# Patient Record
Sex: Female | Born: 1945 | Race: Black or African American | Hispanic: No | Marital: Married | Smoking: Current every day smoker
Health system: Southern US, Community
[De-identification: ages and names within clinical notes are randomized; demographics above are authoritative.]

## PROBLEM LIST (undated history)

## (undated) DIAGNOSIS — F3289 Other specified depressive episodes: Secondary | ICD-10-CM

## (undated) DIAGNOSIS — K635 Polyp of colon: Secondary | ICD-10-CM

## (undated) DIAGNOSIS — E785 Hyperlipidemia, unspecified: Secondary | ICD-10-CM

## (undated) DIAGNOSIS — R0602 Shortness of breath: Secondary | ICD-10-CM

## (undated) DIAGNOSIS — E119 Type 2 diabetes mellitus without complications: Secondary | ICD-10-CM

## (undated) DIAGNOSIS — F419 Anxiety disorder, unspecified: Secondary | ICD-10-CM

## (undated) DIAGNOSIS — R053 Chronic cough: Secondary | ICD-10-CM

## (undated) DIAGNOSIS — M1990 Unspecified osteoarthritis, unspecified site: Secondary | ICD-10-CM

## (undated) DIAGNOSIS — E213 Hyperparathyroidism, unspecified: Secondary | ICD-10-CM

## (undated) DIAGNOSIS — I10 Essential (primary) hypertension: Secondary | ICD-10-CM

## (undated) DIAGNOSIS — K219 Gastro-esophageal reflux disease without esophagitis: Secondary | ICD-10-CM

## (undated) DIAGNOSIS — D649 Anemia, unspecified: Secondary | ICD-10-CM

## (undated) DIAGNOSIS — I509 Heart failure, unspecified: Secondary | ICD-10-CM

## (undated) DIAGNOSIS — J449 Chronic obstructive pulmonary disease, unspecified: Secondary | ICD-10-CM

## (undated) DIAGNOSIS — R05 Cough: Secondary | ICD-10-CM

## (undated) DIAGNOSIS — N289 Disorder of kidney and ureter, unspecified: Secondary | ICD-10-CM

## (undated) DIAGNOSIS — F329 Major depressive disorder, single episode, unspecified: Secondary | ICD-10-CM

## (undated) HISTORY — DX: Essential (primary) hypertension: I10

## (undated) HISTORY — DX: Cough: R05

## (undated) HISTORY — DX: Gastro-esophageal reflux disease without esophagitis: K21.9

## (undated) HISTORY — DX: Disorder of kidney and ureter, unspecified: N28.9

## (undated) HISTORY — DX: Major depressive disorder, single episode, unspecified: F32.9

## (undated) HISTORY — DX: Chronic cough: R05.3

## (undated) HISTORY — DX: Type 2 diabetes mellitus without complications: E11.9

## (undated) HISTORY — DX: Unspecified osteoarthritis, unspecified site: M19.90

## (undated) HISTORY — DX: Hyperlipidemia, unspecified: E78.5

## (undated) HISTORY — DX: Other specified depressive episodes: F32.89

## (undated) HISTORY — DX: Heart failure, unspecified: I50.9

---

## 1998-08-14 ENCOUNTER — Ambulatory Visit (HOSPITAL_COMMUNITY): Admission: RE | Admit: 1998-08-14 | Discharge: 1998-08-14 | Payer: Self-pay | Admitting: Internal Medicine

## 1999-08-12 ENCOUNTER — Other Ambulatory Visit: Admission: RE | Admit: 1999-08-12 | Discharge: 1999-08-12 | Payer: Self-pay | Admitting: Obstetrics and Gynecology

## 1999-08-17 ENCOUNTER — Encounter: Payer: Self-pay | Admitting: Internal Medicine

## 1999-08-17 ENCOUNTER — Ambulatory Visit (HOSPITAL_COMMUNITY): Admission: RE | Admit: 1999-08-17 | Discharge: 1999-08-17 | Payer: Self-pay | Admitting: Internal Medicine

## 1999-11-03 ENCOUNTER — Encounter (INDEPENDENT_AMBULATORY_CARE_PROVIDER_SITE_OTHER): Payer: Self-pay | Admitting: Specialist

## 1999-11-03 ENCOUNTER — Other Ambulatory Visit: Admission: RE | Admit: 1999-11-03 | Discharge: 1999-11-03 | Payer: Self-pay | Admitting: Gastroenterology

## 2000-08-18 ENCOUNTER — Other Ambulatory Visit: Admission: RE | Admit: 2000-08-18 | Discharge: 2000-08-18 | Payer: Self-pay | Admitting: Obstetrics and Gynecology

## 2000-08-22 ENCOUNTER — Ambulatory Visit (HOSPITAL_COMMUNITY): Admission: RE | Admit: 2000-08-22 | Discharge: 2000-08-22 | Payer: Self-pay | Admitting: Internal Medicine

## 2000-08-22 ENCOUNTER — Encounter: Payer: Self-pay | Admitting: Internal Medicine

## 2001-08-28 ENCOUNTER — Other Ambulatory Visit: Admission: RE | Admit: 2001-08-28 | Discharge: 2001-08-28 | Payer: Self-pay | Admitting: Obstetrics and Gynecology

## 2001-09-24 ENCOUNTER — Encounter: Payer: Self-pay | Admitting: Internal Medicine

## 2001-09-24 ENCOUNTER — Ambulatory Visit (HOSPITAL_COMMUNITY): Admission: RE | Admit: 2001-09-24 | Discharge: 2001-09-24 | Payer: Self-pay | Admitting: Internal Medicine

## 2002-05-29 ENCOUNTER — Encounter: Payer: Self-pay | Admitting: Internal Medicine

## 2002-05-29 ENCOUNTER — Ambulatory Visit (HOSPITAL_COMMUNITY): Admission: RE | Admit: 2002-05-29 | Discharge: 2002-05-29 | Payer: Self-pay | Admitting: Internal Medicine

## 2002-07-16 ENCOUNTER — Ambulatory Visit (HOSPITAL_COMMUNITY): Admission: RE | Admit: 2002-07-16 | Discharge: 2002-07-16 | Payer: Self-pay | Admitting: Internal Medicine

## 2002-07-16 ENCOUNTER — Encounter: Payer: Self-pay | Admitting: Internal Medicine

## 2002-09-04 ENCOUNTER — Other Ambulatory Visit: Admission: RE | Admit: 2002-09-04 | Discharge: 2002-09-04 | Payer: Self-pay | Admitting: Obstetrics and Gynecology

## 2003-10-15 ENCOUNTER — Ambulatory Visit (HOSPITAL_COMMUNITY): Admission: RE | Admit: 2003-10-15 | Discharge: 2003-10-15 | Payer: Self-pay | Admitting: Internal Medicine

## 2003-10-30 ENCOUNTER — Other Ambulatory Visit: Admission: RE | Admit: 2003-10-30 | Discharge: 2003-10-30 | Payer: Self-pay | Admitting: Obstetrics and Gynecology

## 2003-12-05 ENCOUNTER — Encounter: Admission: RE | Admit: 2003-12-05 | Discharge: 2003-12-05 | Payer: Self-pay | Admitting: Obstetrics and Gynecology

## 2003-12-18 ENCOUNTER — Encounter: Admission: RE | Admit: 2003-12-18 | Discharge: 2003-12-18 | Payer: Self-pay | Admitting: Nephrology

## 2004-10-20 ENCOUNTER — Ambulatory Visit (HOSPITAL_COMMUNITY): Admission: RE | Admit: 2004-10-20 | Discharge: 2004-10-20 | Payer: Self-pay | Admitting: Internal Medicine

## 2004-11-02 ENCOUNTER — Encounter: Admission: RE | Admit: 2004-11-02 | Discharge: 2004-11-02 | Payer: Self-pay | Admitting: Nephrology

## 2004-12-10 ENCOUNTER — Encounter: Admission: RE | Admit: 2004-12-10 | Discharge: 2004-12-10 | Payer: Self-pay | Admitting: Nephrology

## 2005-11-15 ENCOUNTER — Ambulatory Visit (HOSPITAL_COMMUNITY): Admission: RE | Admit: 2005-11-15 | Discharge: 2005-11-15 | Payer: Self-pay | Admitting: Internal Medicine

## 2006-02-03 ENCOUNTER — Encounter: Admission: RE | Admit: 2006-02-03 | Discharge: 2006-05-04 | Payer: Self-pay | Admitting: Internal Medicine

## 2006-03-22 ENCOUNTER — Encounter: Payer: Self-pay | Admitting: Internal Medicine

## 2006-07-06 ENCOUNTER — Observation Stay (HOSPITAL_COMMUNITY): Admission: EM | Admit: 2006-07-06 | Discharge: 2006-07-07 | Payer: Self-pay | Admitting: Emergency Medicine

## 2006-07-31 ENCOUNTER — Encounter: Admission: RE | Admit: 2006-07-31 | Discharge: 2006-07-31 | Payer: Self-pay | Admitting: Rheumatology

## 2006-11-16 ENCOUNTER — Ambulatory Visit (HOSPITAL_COMMUNITY): Admission: RE | Admit: 2006-11-16 | Discharge: 2006-11-16 | Payer: Self-pay | Admitting: Internal Medicine

## 2006-11-28 ENCOUNTER — Encounter: Admission: RE | Admit: 2006-11-28 | Discharge: 2006-11-28 | Payer: Self-pay | Admitting: Internal Medicine

## 2006-12-13 ENCOUNTER — Ambulatory Visit: Payer: Self-pay | Admitting: Gastroenterology

## 2007-01-02 ENCOUNTER — Encounter (INDEPENDENT_AMBULATORY_CARE_PROVIDER_SITE_OTHER): Payer: Self-pay | Admitting: Specialist

## 2007-01-02 ENCOUNTER — Ambulatory Visit: Payer: Self-pay | Admitting: Gastroenterology

## 2007-01-30 ENCOUNTER — Ambulatory Visit: Payer: Self-pay | Admitting: Vascular Surgery

## 2008-01-15 ENCOUNTER — Encounter: Admission: RE | Admit: 2008-01-15 | Discharge: 2008-01-15 | Payer: Self-pay | Admitting: Internal Medicine

## 2008-01-25 ENCOUNTER — Ambulatory Visit (HOSPITAL_COMMUNITY): Admission: RE | Admit: 2008-01-25 | Discharge: 2008-01-25 | Payer: Self-pay | Admitting: Internal Medicine

## 2008-09-21 ENCOUNTER — Inpatient Hospital Stay (HOSPITAL_COMMUNITY): Admission: EM | Admit: 2008-09-21 | Discharge: 2008-09-24 | Payer: Self-pay | Admitting: Emergency Medicine

## 2008-09-22 ENCOUNTER — Encounter (INDEPENDENT_AMBULATORY_CARE_PROVIDER_SITE_OTHER): Payer: Self-pay | Admitting: *Deleted

## 2008-09-23 ENCOUNTER — Encounter (INDEPENDENT_AMBULATORY_CARE_PROVIDER_SITE_OTHER): Payer: Self-pay | Admitting: Internal Medicine

## 2008-10-10 ENCOUNTER — Ambulatory Visit: Payer: Self-pay | Admitting: Cardiology

## 2008-10-22 ENCOUNTER — Ambulatory Visit: Payer: Self-pay

## 2009-01-16 ENCOUNTER — Ambulatory Visit (HOSPITAL_COMMUNITY): Admission: RE | Admit: 2009-01-16 | Discharge: 2009-01-16 | Payer: Self-pay | Admitting: Internal Medicine

## 2009-01-16 ENCOUNTER — Encounter: Admission: RE | Admit: 2009-01-16 | Discharge: 2009-01-16 | Payer: Self-pay | Admitting: Internal Medicine

## 2009-02-03 DIAGNOSIS — F325 Major depressive disorder, single episode, in full remission: Secondary | ICD-10-CM | POA: Insufficient documentation

## 2009-02-03 DIAGNOSIS — I10 Essential (primary) hypertension: Secondary | ICD-10-CM

## 2009-02-04 ENCOUNTER — Encounter: Payer: Self-pay | Admitting: Cardiology

## 2009-02-04 ENCOUNTER — Ambulatory Visit: Payer: Self-pay | Admitting: Cardiology

## 2009-02-04 DIAGNOSIS — E663 Overweight: Secondary | ICD-10-CM | POA: Insufficient documentation

## 2009-02-11 ENCOUNTER — Encounter: Payer: Self-pay | Admitting: Emergency Medicine

## 2009-04-09 ENCOUNTER — Ambulatory Visit: Payer: Self-pay | Admitting: Cardiology

## 2009-04-09 DIAGNOSIS — I5032 Chronic diastolic (congestive) heart failure: Secondary | ICD-10-CM | POA: Insufficient documentation

## 2009-04-24 ENCOUNTER — Ambulatory Visit: Payer: Self-pay | Admitting: Emergency Medicine

## 2009-05-26 ENCOUNTER — Ambulatory Visit: Payer: Self-pay | Admitting: Emergency Medicine

## 2009-07-09 ENCOUNTER — Ambulatory Visit: Payer: Self-pay | Admitting: Cardiology

## 2009-07-17 ENCOUNTER — Ambulatory Visit: Payer: Self-pay | Admitting: Emergency Medicine

## 2009-07-17 DIAGNOSIS — J309 Allergic rhinitis, unspecified: Secondary | ICD-10-CM | POA: Insufficient documentation

## 2009-07-28 ENCOUNTER — Encounter: Payer: Self-pay | Admitting: Emergency Medicine

## 2009-09-17 ENCOUNTER — Encounter: Payer: Self-pay | Admitting: Cardiology

## 2009-09-23 ENCOUNTER — Encounter: Payer: Self-pay | Admitting: Cardiology

## 2009-10-12 ENCOUNTER — Encounter (INDEPENDENT_AMBULATORY_CARE_PROVIDER_SITE_OTHER): Payer: Self-pay | Admitting: *Deleted

## 2009-11-11 ENCOUNTER — Ambulatory Visit: Payer: Self-pay | Admitting: Internal Medicine

## 2009-11-11 ENCOUNTER — Ambulatory Visit: Payer: Self-pay | Admitting: Emergency Medicine

## 2009-11-16 ENCOUNTER — Telehealth: Payer: Self-pay | Admitting: Cardiology

## 2009-11-19 ENCOUNTER — Ambulatory Visit: Payer: Self-pay | Admitting: Internal Medicine

## 2009-11-19 ENCOUNTER — Telehealth: Payer: Self-pay | Admitting: Cardiology

## 2009-11-19 LAB — CONVERTED CEMR LAB
Chloride: 104 meq/L (ref 96–112)
GFR calc non Af Amer: 21.91 mL/min (ref >60)
Glucose, Bld: 152 mg/dL — ABNORMAL HIGH (ref 70–99)
Potassium: 3.8 meq/L (ref 3.5–5.1)
Sodium: 142 meq/L (ref 135–145)

## 2009-11-21 HISTORY — PX: VASCULAR SURGERY: SHX849

## 2009-11-24 ENCOUNTER — Ambulatory Visit: Payer: Self-pay | Admitting: Cardiology

## 2009-11-30 ENCOUNTER — Ambulatory Visit: Payer: Self-pay | Admitting: Emergency Medicine

## 2009-11-30 DIAGNOSIS — K219 Gastro-esophageal reflux disease without esophagitis: Secondary | ICD-10-CM

## 2009-12-29 ENCOUNTER — Ambulatory Visit: Payer: Self-pay

## 2009-12-29 ENCOUNTER — Ambulatory Visit: Payer: Self-pay | Admitting: Cardiology

## 2009-12-29 ENCOUNTER — Ambulatory Visit (HOSPITAL_COMMUNITY): Admission: RE | Admit: 2009-12-29 | Discharge: 2009-12-29 | Payer: Self-pay | Admitting: Cardiology

## 2009-12-29 ENCOUNTER — Encounter: Payer: Self-pay | Admitting: Cardiology

## 2010-01-08 ENCOUNTER — Ambulatory Visit: Payer: Self-pay | Admitting: Emergency Medicine

## 2010-01-12 ENCOUNTER — Encounter: Payer: Self-pay | Admitting: Cardiology

## 2010-01-13 ENCOUNTER — Encounter: Payer: Self-pay | Admitting: Cardiology

## 2010-01-14 ENCOUNTER — Ambulatory Visit: Payer: Self-pay | Admitting: Cardiology

## 2010-01-28 ENCOUNTER — Ambulatory Visit (HOSPITAL_COMMUNITY): Admission: RE | Admit: 2010-01-28 | Discharge: 2010-01-28 | Payer: Self-pay | Admitting: Internal Medicine

## 2010-01-28 ENCOUNTER — Encounter: Admission: RE | Admit: 2010-01-28 | Discharge: 2010-01-28 | Payer: Self-pay | Admitting: Family Medicine

## 2010-02-02 ENCOUNTER — Telehealth: Payer: Self-pay | Admitting: Emergency Medicine

## 2010-02-05 ENCOUNTER — Encounter: Admission: RE | Admit: 2010-02-05 | Discharge: 2010-02-05 | Payer: Self-pay | Admitting: Nephrology

## 2010-02-24 ENCOUNTER — Ambulatory Visit: Payer: Self-pay | Admitting: Emergency Medicine

## 2010-02-24 DIAGNOSIS — J449 Chronic obstructive pulmonary disease, unspecified: Secondary | ICD-10-CM

## 2010-06-08 ENCOUNTER — Telehealth: Payer: Self-pay | Admitting: Emergency Medicine

## 2010-06-09 ENCOUNTER — Ambulatory Visit: Payer: Self-pay | Admitting: Internal Medicine

## 2010-06-21 ENCOUNTER — Telehealth (INDEPENDENT_AMBULATORY_CARE_PROVIDER_SITE_OTHER): Payer: Self-pay | Admitting: *Deleted

## 2010-06-23 ENCOUNTER — Ambulatory Visit: Payer: Self-pay | Admitting: Cardiology

## 2010-06-24 ENCOUNTER — Ambulatory Visit: Payer: Self-pay | Admitting: Vascular Surgery

## 2010-07-06 ENCOUNTER — Ambulatory Visit: Payer: Self-pay | Admitting: Vascular Surgery

## 2010-07-06 ENCOUNTER — Ambulatory Visit (HOSPITAL_COMMUNITY)
Admission: RE | Admit: 2010-07-06 | Discharge: 2010-07-06 | Payer: Self-pay | Source: Home / Self Care | Admitting: Vascular Surgery

## 2010-07-13 ENCOUNTER — Ambulatory Visit: Payer: Self-pay | Admitting: Cardiology

## 2010-07-13 ENCOUNTER — Encounter: Payer: Self-pay | Admitting: Cardiology

## 2010-07-13 ENCOUNTER — Inpatient Hospital Stay (HOSPITAL_COMMUNITY): Admission: EM | Admit: 2010-07-13 | Discharge: 2010-07-16 | Payer: Self-pay | Admitting: Emergency Medicine

## 2010-07-16 ENCOUNTER — Encounter: Payer: Self-pay | Admitting: Cardiology

## 2010-07-22 ENCOUNTER — Ambulatory Visit: Payer: Self-pay | Admitting: Emergency Medicine

## 2010-07-23 ENCOUNTER — Inpatient Hospital Stay (HOSPITAL_COMMUNITY): Admission: EM | Admit: 2010-07-23 | Discharge: 2010-07-25 | Payer: Self-pay | Admitting: Emergency Medicine

## 2010-07-23 ENCOUNTER — Ambulatory Visit: Payer: Self-pay | Admitting: Cardiology

## 2010-07-30 ENCOUNTER — Ambulatory Visit: Payer: Self-pay | Admitting: Internal Medicine

## 2010-07-30 ENCOUNTER — Encounter: Payer: Self-pay | Admitting: Physician Assistant

## 2010-07-30 DIAGNOSIS — I498 Other specified cardiac arrhythmias: Secondary | ICD-10-CM

## 2010-08-02 ENCOUNTER — Ambulatory Visit: Payer: Self-pay | Admitting: Internal Medicine

## 2010-08-03 ENCOUNTER — Encounter (HOSPITAL_COMMUNITY)
Admission: RE | Admit: 2010-08-03 | Discharge: 2010-11-01 | Payer: Self-pay | Source: Home / Self Care | Attending: Nephrology | Admitting: Nephrology

## 2010-08-06 ENCOUNTER — Encounter: Payer: Self-pay | Admitting: Cardiology

## 2010-08-11 ENCOUNTER — Ambulatory Visit: Payer: Self-pay | Admitting: Cardiology

## 2010-08-11 DIAGNOSIS — E785 Hyperlipidemia, unspecified: Secondary | ICD-10-CM

## 2010-08-12 ENCOUNTER — Encounter: Payer: Self-pay | Admitting: Cardiology

## 2010-08-12 LAB — CONVERTED CEMR LAB
CO2: 36 meq/L — ABNORMAL HIGH (ref 19–32)
Calcium: 8.5 mg/dL (ref 8.4–10.5)
Creatinine, Ser: 4 mg/dL — ABNORMAL HIGH (ref 0.4–1.2)
GFR calc non Af Amer: 14.61 mL/min (ref >60)
Glucose, Bld: 61 mg/dL — ABNORMAL LOW (ref 70–99)
Sodium: 144 meq/L (ref 135–145)

## 2010-08-18 ENCOUNTER — Ambulatory Visit: Payer: Self-pay | Admitting: Cardiology

## 2010-08-18 ENCOUNTER — Ambulatory Visit: Payer: Self-pay | Admitting: Vascular Surgery

## 2010-08-25 LAB — CONVERTED CEMR LAB
Calcium: 9.2 mg/dL (ref 8.4–10.5)
Chloride: 107 meq/L (ref 96–112)
Creatinine, Ser: 3.9 mg/dL — ABNORMAL HIGH (ref 0.4–1.2)
Glucose, Bld: 45 mg/dL — CL (ref 70–99)
Potassium: 4.1 meq/L (ref 3.5–5.1)
Sodium: 147 meq/L — ABNORMAL HIGH (ref 135–145)

## 2010-09-01 ENCOUNTER — Ambulatory Visit: Payer: Self-pay | Admitting: Vascular Surgery

## 2010-09-13 ENCOUNTER — Encounter (INDEPENDENT_AMBULATORY_CARE_PROVIDER_SITE_OTHER): Payer: Self-pay | Admitting: *Deleted

## 2010-09-14 ENCOUNTER — Ambulatory Visit: Payer: Self-pay | Admitting: Cardiology

## 2010-09-20 ENCOUNTER — Encounter: Payer: Self-pay | Admitting: Cardiology

## 2010-11-02 ENCOUNTER — Encounter (HOSPITAL_COMMUNITY)
Admission: RE | Admit: 2010-11-02 | Discharge: 2010-12-21 | Payer: Self-pay | Source: Home / Self Care | Attending: Nephrology | Admitting: Nephrology

## 2010-12-06 LAB — RENAL FUNCTION PANEL
Albumin: 3 g/dL — ABNORMAL LOW (ref 3.5–5.2)
BUN: 75 mg/dL — ABNORMAL HIGH (ref 6–23)
CO2: 28 mEq/L (ref 19–32)
Calcium: 9.2 mg/dL (ref 8.4–10.5)
Chloride: 95 mEq/L — ABNORMAL LOW (ref 96–112)
Creatinine, Ser: 3.78 mg/dL — ABNORMAL HIGH (ref 0.4–1.2)
GFR calc Af Amer: 15 mL/min — ABNORMAL LOW (ref >60)
GFR calc non Af Amer: 12 mL/min — ABNORMAL LOW (ref >60)
Glucose, Bld: 195 mg/dL — ABNORMAL HIGH (ref 70–99)
Phosphorus: 5.9 mg/dL — ABNORMAL HIGH (ref 2.3–4.6)
Potassium: 3.5 mEq/L (ref 3.5–5.1)
Sodium: 137 mEq/L (ref 135–145)

## 2010-12-06 LAB — FERRITIN: Ferritin: 68 ng/mL (ref 10–291)

## 2010-12-06 LAB — POCT HEMOGLOBIN-HEMACUE: Hemoglobin: 11 g/dL — ABNORMAL LOW (ref 12.0–15.0)

## 2010-12-06 LAB — IRON AND TIBC
Iron: 58 ug/dL (ref 42–135)
Saturation Ratios: 21 % (ref 20–55)
TIBC: 274 ug/dL (ref 250–470)
UIBC: 216 ug/dL

## 2010-12-12 ENCOUNTER — Encounter: Payer: Self-pay | Admitting: Internal Medicine

## 2010-12-23 NOTE — Progress Notes (Signed)
Summary: rx  Phone Note Call from Patient Call back at Home Phone [phone]   Caller: Patient Call For: Asheley Hellberg Reason for Call: Talk to Nurse Summary of Call: real bad cough, long time.  Off & on for several months.  Doesn't seem to be working - coughing constantly. [facility]. Initial call taken by: Eugene Gavia,  June 08, 2010 3:58 PM  Follow-up for Phone Call        called and spoke with pt and she stated that she has had this cough x 2-3 months---dry cough---sometimes with white/yellow sputum--stated she feels like she has a fever today--has coughed so much her muscles in her chest are sore.  please advise.   ALLERGIES:   CODEINE  Additional Follow-up for Phone Call Additional follow up Details #1::        she will need ov  mucinex dm two times a day as needed cough/congestion  Additional Follow-up by: Rubye Oaks NP,  June 08, 2010 4:12 PM    Additional Follow-up for Phone Call Additional follow up Details #2::    appt made for pt per TP to see MW on 7-20 at 10:45---pt is aware of appt Randell Loop CMA  June 08, 2010 4:19 PM

## 2010-12-23 NOTE — Letter (Signed)
Summary: Dept of Health & Human Services   Dept of Health & Human Services   Imported By: Marylou Mccoy 09/06/2010 14:50:49  _____________________________________________________________________  External Attachment:    Type:   Image     Comment:   External Document

## 2010-12-23 NOTE — Assessment & Plan Note (Signed)
Summary: COPD, allergies   Visit Type:  Follow-up Copy to:  Dr. Hyman Hopes Primary Provider/Referring Provider:  Marlowe Shores, MD  CC:  2 month follow up.  States breathing is doing well overall.  Denies SOB, wheezing and chest tightness.  Pt states she does have a prod cough with light mucus and thick, and cloudy mucus.  Requesting med for this.  .  History of Present Illness: 65 yo former smoker (60 pk-yrs), hx DM, renal insuff, HTN. Referred by Dr Hyman Hopes for evaluation of cough. It began over a year ago, seemed to have started after a resp infection but then persisted in setting GERD and PND. Has also been having SOB with exertion, notices it when she walks the stairs. Slight improvement off Micardis.  November 30, 2009--Returns for 1 month follow up - states cough is resolved, SOB has improved but is still present and still having some increased edema in hands/feet/ankles though pt states that has improved as well. Last visit with flare of cough . Tx w/ aggressive rhinitis and Gerd prevention. CXR showed increased interstitial markings. She was seen by Cards, Hochrein, last week Lasix was increased w/ decreased edema. Weight is down 3 lbs. Denies chest pain, dyspnea, orthopnea, hemoptysis, fever, n/v/d.   ROV 01/08/10 -- returns for f/u. Still with occas cough. Tells me that she continues to have exertional SOB, especially climbing stairs. She feels she has been "filling up with fluid". Dr Antoine Poche has been balancing volume status with renal fxn with lasix dosing. She has gained 3 lbs since last visit. Currently back on her baseline lasix dose 40mg  two times a day. Still with nasal gtt, uses fexofenadine + Nasocort. Doesn't do NSWs anymore. She has tried SABA, believes that it helped.   ROV 02/24/10 -- returns after trial spiriva once daily to treat mild COPD. Tells me that she is having continued PND and cough on fexofenadine + nasacort, only doingt NSW once a week. She tells today that the Spiriva seemed  to help her - she feels that breathing is better, able to exert more, tolerate more exertion.   Current Medications (verified): 1)  Aspirin 81 Mg Tabs (Aspirin) .... One By Mouth Daily 2)  Atenolol 100 Mg Tabs (Atenolol) .... One By Mouth Daily 3)  Clonidine Hcl 0.3 Mg Tabs (Clonidine Hcl) .... Two Times A Day 4)  Furosemide 40 Mg Tabs (Furosemide) .... One By Mouth Two Times A Day 5)  Lantus 100 Unit/ml Soln (Insulin Glargine) .... 50 Units Am and 30 Units At Bedtime 6)  Humalog 100 Unit/ml Soln (Insulin Lispro (Human)) .... Ssi 7)  Lyrica 50 Mg Caps (Pregabalin) .... One By Mouth Two Times A Day 8)  Nexium 40 Mg Pack (Esomeprazole Magnesium) .... One By Mouth Daily 9)  Vytorin 10-80 Mg Tabs (Ezetimibe-Simvastatin) .... One By Mouth Daily 10)  Citalopram Hydrobromide 40 Mg Tabs (Citalopram Hydrobromide) .... One By Mouth Daily 11)  Nasacort Aq 55 Mcg/act Aers (Triamcinolone Acetonide(Nasal)) .... 2 Sprays Each Side Once Daily 12)  Fexofenadine Hcl 180 Mg Tabs (Fexofenadine Hcl) .Marland Kitchen.. 1 By Mouth Once Daily 13)  Vitamin D3 2000 Unit Caps (Cholecalciferol) .... Once Daily 14)  Vitamin B-12 1000 Mcg Tabs (Cyanocobalamin) .Marland Kitchen.. 1 Tab Daily 15)  Tessalon 200 Mg Caps (Benzonatate) .Marland Kitchen.. 1 By Mouth Three Times A Day As Needed Cough 16)  Tramadol Hcl 50 Mg Tabs (Tramadol Hcl) .Marland Kitchen.. 1 By Mouth Every 4 Hr As Needed 17)  Ventolin Hfa 108 (90 Base) Mcg/act Aers (Albuterol Sulfate) .Marland KitchenMarland KitchenMarland Kitchen  As Needed 18)  Zemplar 1 Mcg Caps (Paricalcitol) .... 3 Times Weekly 19)  Tums 500 Mg Chew (Calcium Carbonate Antacid) .... With Meals 20)  Spiriva Handihaler 18 Mcg Caps (Tiotropium Bromide Monohydrate) .... Inhale One Capsule Daily  Allergies (verified): 1)  ! * Codiene  Vital Signs:  Patient profile:   65 year old female Height:      64.5 inches Weight:      218.50 pounds BMI:     37.06 O2 Sat:      98 % on Room air Temp:     98.1 degrees F oral Pulse rate:   47 / minute BP sitting:   154 / 72  (left arm) Cuff  size:   large  Vitals Entered By: Gweneth Dimitri RN (February 24, 2010 8:51 AM)  O2 Flow:  Room air CC: 2 month follow up.  States breathing is doing well overall.  Denies SOB, wheezing and chest tightness.  Pt states she does have a prod cough with light mucus and thick, cloudy mucus.  Requesting med for this.   Comments Medications reviewed with patient Daytime contact number verified with patient. Gweneth Dimitri RN  February 24, 2010 8:51 AM    Physical Exam  General:  normal appearance, healthy appearing, and obese.   Head:  normocephalic and atraumatic Eyes:  conjunctiva and sclera clear Nose:  no deformity, discharge, inflammation, or lesions Mouth:  no deformity or lesions, mild posterior pharyngeal erythema, upper dentures Neck:  no masses, thyromegaly, or abnormal cervical nodes Lungs:  clear bilaterally to auscultation Heart:  regular rate and rhythm, S1, S2 without murmurs, rubs, gallops, or clicks Abdomen:  not examined Msk:  no deformity or scoliosis noted with normal posture Extremities:  1+ edema, support hose on Neurologic:  non-focal Psych:  alert and cooperative; normal mood and affect; normal attention span and concentration   Impression & Recommendations:  Problem # 1:  COUGH (ICD-786.2)  - add NSWs every day - contin fexofenadine and nasacort - add back tessalon pearls  Orders: Est. Patient Level III ([address])  Problem # 2:  COPD, MILD (ICD-496) - continue spiriva - as needed SABA  Medications Added to Medication List This Visit: 1)  Clonidine Hcl 0.3 Mg Tabs (Clonidine hcl) .... Two times a day 2)  Lantus 100 Unit/ml Soln (Insulin glargine) .... 50 units am and 30 units at bedtime 3)  Humalog 100 Unit/ml Soln (Insulin lispro (human)) .... Ssi  Patient Instructions: 1)  Continue Spiriva once daily  2)  Use your Ventolin as needed  3)  Continue your fexofenadine and Nasacort spray 4)  Use tessalon pearls as needed  5)  Follow up with Dr Delton Coombes in 4  months or as needed  Prescriptions: TESSALON 200 MG CAPS (BENZONATATE) 1 by mouth three times a day as needed cough  #30 x 2   Entered and Authorized by:   Leslye Peer MD   Signed by:   Leslye Peer MD on 02/24/2010   Method used:   Electronically to        [id number] (retail)       [address]       [place], [place]  [id number]       Ph: [phone]       Fax: [phone]   RxID:   [id number]

## 2010-12-23 NOTE — Progress Notes (Signed)
Summary: sinus CT, limited > ok to order  Phone Note Call from Patient Call back at Home Phone [phone]   Caller: Patient Call For: byrum Summary of Call: pt saw dr wert 2 wks ago for cough. says MW advised her to call in 2 wks if cough hadn't improved and ask to have a sinus CT scheduled.  Initial call taken by: Tivis Ringer, CNA,  June 21, 2010 9:30 AM  Follow-up for Phone Call        called spoke with patient who states that her cough is only marginally improved since last ov w/ MW and would like to go ahead and have the sinus CT scheduled.  order placed in EMR, pt is aware someone will call her with the appt date and time.    Dr. Sherene Sires, does this CT need to be w/ or w/o contrast? Boone Master CNA/MA  June 21, 2010 10:18 AM  limited ct sinus, no contrast Follow-up by: Nyoka Cowden MD,  June 21, 2010 11:16 AM  Additional Follow-up for Phone Call Additional follow up Details #1::        Order placed in EMR -- pt aware order has been placed and will receive call regarding appt date and time of CT.  Gweneth Dimitri RN  June 21, 2010 11:32 AM

## 2010-12-23 NOTE — Assessment & Plan Note (Signed)
Summary: cough, obesity, CHF, CRI, mild COPD   Visit Type:  Follow-up Copy to:  Dr. Hyman Hopes Primary Provider/Referring Provider:  Marlowe Shores, MD  CC:  Cough follow-up.  The patient says her cough is gone...recently in hospital for CHF...increased sob and abdominal swelling...patient is out of Bystolic samples and will need RX if needing to continue on this medication.Marland Kitchen  History of Present Illness: 65 yobf quit smoking after hospitalization [facility] 2009 never better since then with essentially nl pft's documented 05/26/09.   June 09, 2010 ov  c/o worsening cough over the past few months.  Cough mainly dry- occ produces minimal clear to yellow sputum.  Pt states that she coughs "all day and night"- gets SOB when has coughing spells- esp at night w/in 10 min of lying down but minimal actual sputum production and no premature awakening or am exacerbation.  Pt denies any significant sore throat, dysphagia, itching, sneezing,  nasal congestion or excess secretions,  fever, chills, sweats, unintended wt loss, pleuritic or exertional cp, hempoptysis, change in activity tolerance  orthopnea pnd or leg swelling   ROV 07/22/10 -- 65 yo woman. Hx mild COPD, CHF, chronic cough. Presented last time with refractory cough - Dr Sherene Sires held Spiriva, fexofenadine, atenolol. New b-b;locker per cards is coreg. Drainage much improved with chlorphenerimine. Cough is much improved. She doesn't miss the Spiriva. She was started on supplimental O2 during hosp admission last week.   Current Medications (verified): 1)  Aspirin 81 Mg Tabs (Aspirin) .... One By Mouth Daily 2)  Clonidine Hcl 0.3 Mg Tabs (Clonidine Hcl) .... Two Times A Day 3)  Furosemide 40 Mg Tabs (Furosemide) .... One By Mouth Two Times A Day 4)  Lantus 100 Unit/ml Soln (Insulin Glargine) .... 50 Units Am and 30 Units At Bedtime 5)  Humalog 100 Unit/ml Soln (Insulin Lispro (Human)) .... Ssi 6)  Lyrica 50 Mg Caps (Pregabalin) .... One By Mouth Two Times A Day 7)   Nexium 40 Mg Pack (Esomeprazole Magnesium) .... Take  One 30-60 Min Before First Meal of The Day 8)  Vytorin 10-80 Mg Tabs (Ezetimibe-Simvastatin) .... One By Mouth Daily 9)  Citalopram Hydrobromide 40 Mg Tabs (Citalopram Hydrobromide) .... One By Mouth Daily 10)  Nasacort Aq 55 Mcg/act Aers (Triamcinolone Acetonide(Nasal)) .... 2 Sprays Each Side Once Daily 11)  Fexofenadine Hcl 180 Mg Tabs (Fexofenadine Hcl) .Marland Kitchen.. 1 By Mouth Once Daily 12)  Vitamin D3 2000 Unit Caps (Cholecalciferol) .... Once Daily 13)  Vitamin B-12 1000 Mcg Tabs (Cyanocobalamin) .Marland Kitchen.. 1 Tab Daily 14)  Tessalon 200 Mg Caps (Benzonatate) .Marland Kitchen.. 1 By Mouth Three Times A Day As Needed Cough 15)  Tramadol Hcl 50 Mg Tabs (Tramadol Hcl) .Marland Kitchen.. 1 By Mouth Every 4 Hr As Needed 16)  Ventolin Hfa 108 (90 Base) Mcg/act Aers (Albuterol Sulfate) .... As Needed 17)  Zemplar 1 Mcg Caps (Paricalcitol) .... 3 Times Weekly 18)  Tums 500 Mg Chew (Calcium Carbonate Antacid) .... With Meals 19)  Bystolic 10 Mg  Tabs (Nebivolol Hcl) .... Out of Samples 20)  Pepcid Ac Maximum Strength 20 Mg Tabs (Famotidine) .... One At Bedtime 21)  Cardizem Cd 300 Mg Xr24h-Cap (Diltiazem Hcl Coated Beads) .Marland Kitchen.. 1 By Mouth Daily 22)  Carvedilol 6.25 Mg Tabs (Carvedilol) .... 3 By Mouth Two Times A Day  Allergies (verified): 1)  ! * Codiene  Vital Signs:  Patient profile:   65 year old female Height:      64.5 inches (163.83 cm) Weight:  237 pounds (107.73 kg) BMI:     40.20 O2 Sat:      99 % on 2 L/min Temp:     98.0 degrees F (36.67 degrees C) oral Pulse rate:   49 / minute BP sitting:   100 / 62  (right arm) Cuff size:   large  Vitals Entered By: Michel Bickers CMA (July 22, 2010 4:59 PM)  O2 Sat at Rest %:  99 O2 Flow:  2 L/min CC: Cough follow-up.  The patient says her cough is gone...recently in hospital for CHF...increased sob and abdominal swelling...patient is out of Bystolic samples and will need RX if needing to continue on this  medication. Comments Medications reviewed with the patient. Daytime phone verified. Michel Bickers CMA  July 22, 2010 5:00 PM   Physical Exam  General:  normal appearance, healthy appearing, and obese.   Head:  normocephalic and atraumatic Eyes:  conjunctiva and sclera clear Nose:  no deformity, discharge, inflammation, or lesions Mouth:  no deformity or lesions, mild posterior pharyngeal erythema, upper dentures Neck:  no masses, thyromegaly, or abnormal cervical nodes Lungs:  clear bilaterally to auscultation Heart:  regular rate and rhythm, S1, S2 without murmurs, rubs, gallops, or clicks Abdomen:  not examined Msk:  no deformity or scoliosis noted with normal posture Extremities:  1+ edema, support hose on Neurologic:  non-focal Psych:  alert and cooperative; normal mood and affect; normal attention span and concentration   Medications Added to Medication List This Visit: 1)  Bystolic 10 Mg Tabs (Nebivolol hcl) .... Out of samples 2)  Cardizem Cd 300 Mg Xr24h-cap (Diltiazem hcl coated beads) .Marland Kitchen.. 1 by mouth daily 3)  Carvedilol 6.25 Mg Tabs (Carvedilol) .... 3 by mouth two times a day  Other Orders: Est. Patient Level IV (04540) DME Referral (DME)  Patient Instructions: 1)  Please continue your medications as currently ordered.  2)  We will ask [facility] to get you a more portable O2 system.  3)  Follow up with Dr Delton Coombes in 3 months or as needed

## 2010-12-23 NOTE — Miscellaneous (Signed)
  Clinical Lists Changes  Observations: Added new observation of ECHOINTERP: - Left ventricle: The cavity size was normal. Wall thickness was       increased in a pattern of mild LVH. The estimated ejection       fraction was 60%. Wall motion was normal; there were no regional       wall motion abnormalities.     - Mitral valve: Mild regurgitation.     - Left atrium: The atrium was mildly dilated.     - Right atrium: The atrium was mildly dilated.     - Pulmonary arteries: PA peak pressure: 41mm Hg (S). (12/29/2009 12:42)      Echocardiogram  Procedure date:  12/29/2009  Findings:      - Left ventricle: The cavity size was normal. Wall thickness was       increased in a pattern of mild LVH. The estimated ejection       fraction was 60%. Wall motion was normal; there were no regional       wall motion abnormalities.     - Mitral valve: Mild regurgitation.     - Left atrium: The atrium was mildly dilated.     - Right atrium: The atrium was mildly dilated.     - Pulmonary arteries: PA peak pressure: 41mm Hg (S).

## 2010-12-23 NOTE — Letter (Signed)
Summary: [facility] Statement of Medical Necessity   [facility] Statement of Medical Necessity   Imported By: Roderic Ovens 10/04/2010 11:48:29  _____________________________________________________________________  External Attachment:    Type:   Image     Comment:   External Document

## 2010-12-23 NOTE — Miscellaneous (Signed)
  Clinical Lists Changes  Observations: Added new observation of CXR RESULTS:   Findings: There is cardiomegaly and mild interstitial edema.  No   pleural effusion or pneumothorax.    IMPRESSION:   Cardiomegaly and mild interstitial edema.    Read By:  Charyl Dancer,  M.D. (07/23/2010 9:47) Added new observation of CXR RESULTS:  Findings: The heart is enlarged.  There is increased vascular   congestion, interstitial edema and fissural thickening.  No   significant pleural effusion or confluent airspace opacity is   demonstrated.    IMPRESSION:   Interstitial pulmonary edema and fissural thickening consistent   with mild congestive heart failure.    Read By:  Gerrianne Scale,  M.D. (07/13/2010 9:48)      CXR  Procedure date:  07/23/2010  Findings:        Findings: There is cardiomegaly and mild interstitial edema.  No   pleural effusion or pneumothorax.    IMPRESSION:   Cardiomegaly and mild interstitial edema.    Read By:  Charyl Dancer,  M.D.  CXR  Procedure date:  07/13/2010  Findings:       Findings: The heart is enlarged.  There is increased vascular   congestion, interstitial edema and fissural thickening.  No   significant pleural effusion or confluent airspace opacity is   demonstrated.    IMPRESSION:   Interstitial pulmonary edema and fissural thickening consistent   with mild congestive heart failure.    Read By:  Gerrianne Scale,  M.D.

## 2010-12-23 NOTE — Assessment & Plan Note (Signed)
Summary: per check out/sf      Allergies Added:   Primary Provider:  Marlowe Shores, MD   History of Present Illness: The patient presents for followup.  At the last visit she has some increased lower extremity edema. I instructed her to get some compression stockings which she has done. I treated her with a slightly higher dose of diuretic for a few days. She did have her kidney function checked and her creatinine is slightly higher than previous at 3.04 with a BUN of 37. She is back to her previous dose of Lasix. She has seen Dr. Delton Coombes and was treated with Spiriva for a possible pulmonary component to her dyspnea. She has only been on this for 2 days. She has not noticed a difference yet. Her breathing seems to be at baseline. She is not describing any acute shortness of breath, PND or orthopnea. Her swelling seems to be at baseline. She does weigh herself daily and her weight sometimes fluctuate by 3 pounds but they have not been steadily increasing. She does say she is trying better to avoid salt and limit fluid which she was not doing previously.  Current Medications (verified): 1)  Aspirin 81 Mg Tabs (Aspirin) .... One By Mouth Daily 2)  Atenolol 100 Mg Tabs (Atenolol) .... One By Mouth Daily 3)  Clonidine Hcl 0.2 Mg Tabs (Clonidine Hcl) .Marland Kitchen.. 1 Tab By Mouth Three Times A Day 4)  Furosemide 40 Mg Tabs (Furosemide) .... One By Mouth Two Times A Day 5)  Lantus 100 Unit/ml Soln (Insulin Glargine) .... 45 Units Am and 25 Units At Bedtime 6)  Humalog 100 Unit/ml Soln (Insulin Lispro (Human)) .Marland Kitchen.. 10 Units Once Daily 7)  Lyrica 50 Mg Caps (Pregabalin) .... One By Mouth Two Times A Day 8)  Nexium 40 Mg Pack (Esomeprazole Magnesium) .... One By Mouth Daily 9)  Vytorin 10-80 Mg Tabs (Ezetimibe-Simvastatin) .... One By Mouth Daily 10)  Citalopram Hydrobromide 40 Mg Tabs (Citalopram Hydrobromide) .... One By Mouth Daily 11)  Nasacort Aq 55 Mcg/act Aers (Triamcinolone Acetonide(Nasal)) .... 2 Sprays  Each Side Once Daily 12)  Fexofenadine Hcl 180 Mg Tabs (Fexofenadine Hcl) .Marland Kitchen.. 1 By Mouth Once Daily 13)  Vitamin D3 2000 Unit Caps (Cholecalciferol) .... Once Daily 14)  Vitamin B-12 1000 Mcg Tabs (Cyanocobalamin) .Marland Kitchen.. 1 Tab Daily 15)  Tessalon 200 Mg Caps (Benzonatate) .Marland Kitchen.. 1 By Mouth Three Times A Day As Needed Cough 16)  Tramadol Hcl 50 Mg Tabs (Tramadol Hcl) .Marland Kitchen.. 1 By Mouth Every 4 Hr As Needed 17)  Fexofenadine Hcl 180 Mg Tabs (Fexofenadine Hcl) .Marland Kitchen.. 1 Tab By Mouth Once Daily 18)  Ventolin Hfa 108 (90 Base) Mcg/act Aers (Albuterol Sulfate) .... As Needed 19)  Zemplar 1 Mcg Caps (Paricalcitol) .... 3 Times Weekly 20)  Tums 500 Mg Chew (Calcium Carbonate Antacid) .... With Meals  Allergies (verified): 1)  ! * Codiene  Past History:  Past Medical History: Reviewed history from 02/04/2009 and no changes required.  1. Diabetes mellitus type 2.   2. Gastroesophageal reflux disease.   3. Chronic renal insufficiency.   4. Degenerative joint disease.   5. Depression.   6. Hypertension.   7 CHF with a well preserved EF  Review of Systems       As stated in the HPI and negative for all other systems.   Vital Signs:  Patient profile:   65 year old female Height:      65 inches Weight:  218 pounds BMI:     36.41 Pulse rate:   51 / minute Resp:     16 per minute BP sitting:   104 / 64  (right arm)  Vitals Entered By: Marrion Coy, CNA (January 14, 2010 10:13 AM)  Physical Exam  General:  Well developed, well nourished, in no acute distress. Head:  normocephalic and atraumatic Eyes:  PERRLA/EOM intact; conjunctiva and lids normal. Mouth:  Oral mucosa normal. Neck:  Neck supple, no JVD. No masses, thyromegaly or abnormal cervical nodes. Chest Wall:  no deformities or breast masses noted Heart:  Non-displaced PMI, chest non-tender; regular rate and rhythm, S1, S2 without murmurs, rubs or gallops. Carotid upstroke normal, no bruit. Normal abdominal aortic size, no bruits.  Femorals normal pulses, no bruits. Pedals normal pulses. no varicosities. Abdomen:  Bowel sounds positive; abdomen soft and non-tender without masses, organomegaly, or hernias noted. No hepatosplenomegaly, obese Msk:  Back normal, normal gait. Muscle strength and tone normal. Extremities:  mild bilateral lower extremity edema Neurologic:  Alert and oriented x 3. Skin:  Intact without lesions or rashes. Psych:  Normal affect.   Impression & Recommendations:  Problem # 1:  CHRONIC DIASTOLIC HEART FAILURE (ICD-428.32) Today I think she is relatively euvolemic. Really lifestyle changes are most important as increased diuretics will cause progressive renal insufficiency. I think she is okay at this point and is tolerating the meds as listed. I will make no change to her regimen.  Problem # 2:  HYPERTENSION, UNSPECIFIED (ICD-401.9) Her blood pressure is well controlled on the meds as listed. I'll make no change.  Problem # 3:  RENAL INSUFFICIENCY (ICD-588.9) She has followup with the nephrologists in March.  Patient Instructions: 1)  Your physician recommends that you schedule a follow-up appointment in: Monday at 11 am 2)  Your physician recommends that you return for lab work in: on Monday fir basic metabolic panel 428.00 3)  Your physician has recommended you make the following change in your medication: increase furosemide to 40, 40, 20 and 20.  (4 times a day)for   3 days 4)  You have been diagnosed with Congestive Heart Failure or CHF.  CHF is a condition in which a problem with the structure or function of the heart impairs its ability to supply sufficient blood flow to meet the body's needs.  For further information please visit www.cardiosmart.org for detailed information on CHF. 5)  Your physician recommends that you weigh, daily, at the same time every day, and in the same amount of clothing.  Please record your daily weights on the handout provided and bring it to your next  appointment.  Appended Document: per check out/sf PT INSTRUCTIONS ON THIS PT ARE INCORRECT!!  SHE IS TO FOLLOW UP IN 6 MONTHS WITH DR [facility].  SHE HAS NO MEDICATION CHANGES

## 2010-12-23 NOTE — Assessment & Plan Note (Signed)
Summary: Pulmonary/ ext ov for chronic cough   Copy to:  Dr. Hyman Hopes Primary Provider/Referring Provider:  Marlowe Shores, MD  CC:  Acute visit.  Pt c/o worsening cough over the past few months.  Cough mainly dry- occ produces minimal clear to yellow sputum.  Pt states that she coughs "all day and night"- gets SOB when has coughing spells- esp at night. .  History of Present Illness: 43 yobf quit smoking after hospitalization [facility] 2009 never better since then with essentially nl pft's documented 05/26/09.   June 09, 2010 ov  c/o worsening cough over the past few months.  Cough mainly dry- occ produces minimal clear to yellow sputum.  Pt states that she coughs "all day and night"- gets SOB when has coughing spells- esp at night w/in 10 min of lying down but minimal actual sputum production and no premature awakening or am exacerbation.  Pt denies any significant sore throat, dysphagia, itching, sneezing,  nasal congestion or excess secretions,  fever, chills, sweats, unintended wt loss, pleuritic or exertional cp, hempoptysis, change in activity tolerance  orthopnea pnd or leg swelling   Current Medications (verified): 1)  Aspirin 81 Mg Tabs (Aspirin) .... One By Mouth Daily 2)  Atenolol 100 Mg Tabs (Atenolol) .... One By Mouth Daily 3)  Clonidine Hcl 0.3 Mg Tabs (Clonidine Hcl) .... Two Times A Day 4)  Furosemide 40 Mg Tabs (Furosemide) .... One By Mouth Two Times A Day 5)  Lantus 100 Unit/ml Soln (Insulin Glargine) .... 50 Units Am and 30 Units At Bedtime 6)  Humalog 100 Unit/ml Soln (Insulin Lispro (Human)) .... Ssi 7)  Lyrica 50 Mg Caps (Pregabalin) .... One By Mouth Two Times A Day 8)  Nexium 40 Mg Pack (Esomeprazole Magnesium) .... One By Mouth Daily 9)  Vytorin 10-80 Mg Tabs (Ezetimibe-Simvastatin) .... One By Mouth Daily 10)  Citalopram Hydrobromide 40 Mg Tabs (Citalopram Hydrobromide) .... One By Mouth Daily 11)  Nasacort Aq 55 Mcg/act Aers (Triamcinolone Acetonide(Nasal)) .... 2 Sprays Each  Side Once Daily 12)  Fexofenadine Hcl 180 Mg Tabs (Fexofenadine Hcl) .Marland Kitchen.. 1 By Mouth Once Daily 13)  Vitamin D3 2000 Unit Caps (Cholecalciferol) .... Once Daily 14)  Vitamin B-12 1000 Mcg Tabs (Cyanocobalamin) .Marland Kitchen.. 1 Tab Daily 15)  Tessalon 200 Mg Caps (Benzonatate) .Marland Kitchen.. 1 By Mouth Three Times A Day As Needed Cough 16)  Tramadol Hcl 50 Mg Tabs (Tramadol Hcl) .Marland Kitchen.. 1 By Mouth Every 4 Hr As Needed 17)  Ventolin Hfa 108 (90 Base) Mcg/act Aers (Albuterol Sulfate) .... As Needed 18)  Zemplar 1 Mcg Caps (Paricalcitol) .... 3 Times Weekly 19)  Tums 500 Mg Chew (Calcium Carbonate Antacid) .... With Meals 20)  Spiriva Handihaler 18 Mcg Caps (Tiotropium Bromide Monohydrate) .... Inhale One Capsule Daily  Allergies (verified): 1)  ! * Codiene  Past History:  Past Medical History:  1. Diabetes mellitus type 2.   2. Gastroesophageal reflux disease.   3. Chronic renal insufficiency.   4. Degenerative joint disease.   5. Depression.   6. Hypertension   7 CHF with a well preserved EF 8 Chronic cough     - Try off  Atenolol and spiriva June 09, 2010   Vital Signs:  Patient profile:   65 year old female Weight:      227.25 pounds O2 Sat:      93 % on Room air Temp:     98.5 degrees F oral Pulse rate:   52 / minute BP sitting:  112 / 70  (left arm) Cuff size:   large  Vitals Entered By: Vernie Murders (June 09, 2010 11:07 AM)  O2 Flow:  Room air  Physical Exam  Additional Exam:  wt 218 > 227 in general amb bf with classic voice fatigue and subtle pseudowheeze resolves with purse lip maneuver  HEENT:  [place]/AT, , EACs-clear, TMs-wnl, NOSE-clear, THROAT-clear NECK:  Supple w/ fair ROM; no JVD; normal carotid impulses w/o bruits; no thyromegaly or nodules palpated; no lymphadenopathy. RESP  Clear to P & A; decreased BS in bases  CARD:  RRR, no m/r/g   GI:   Soft & nt; nml bowel sounds; no organomegaly or masses detected. Musco: Warm bil,  no calf tenderness, clubbing, pulses intact, trace  edema -support stocking on.      Impression & Recommendations:  Problem # 1:  COUGH (ICD-786.2)   DDX of  difficult airways managment all start with A and  include Adherence, Ace Inhibitors, Acid Reflux, Active Sinus Disease, Alpha 1 Antitripsin deficiency, Anxiety masquerading as Airways dz,  ABPA,  allergy(esp in young), Aspiration (esp in elderly), Adverse effects of DPI,  Active smokers, plus one B  = Beta blocker use..     Adherence seems ok  ? acid reflux See instructions for specific recommendations   ? adverse effect of DPI  - since has no significant airflow obstruction the previous benefit may have been a placebo effect, try off  ? Beta blocker: on high dose atenolol try empiric Bystolic, the most beta -1  selective Beta blocker available in sample form, with bisoprolol the most selective generic choice  on the market.   Orders: Est. Patient Level IV ([address]) Prescription Created Electronically [phone])  Problem # 2:  ALLERGIC RHINITIS (ICD-477.9)  Her updated medication list for this problem includes:    Nasacort Aq 55 Mcg/act Aers (Triamcinolone acetonide(nasal)) .Marland Kitchen... 2 sprays each side once daily    Fexofenadine Hcl 180 Mg Tabs (Fexofenadine hcl) .Marland Kitchen... 1 by mouth once daily  Try H1 antihistamine to see if PNDS better controlled and very short course of prednisone  Medications Added to Medication List This Visit: 1)  Nexium 40 Mg Pack (Esomeprazole magnesium) .... Take  one 30-60 min before first meal of the day 2)  Bystolic 10 Mg Tabs (Nebivolol hcl) .... One tablet daily in place in atenolol 3)  Pepcid Ac Maximum Strength 20 Mg Tabs (Famotidine) .... One at bedtime 4)  Prednisone 10 Mg Tabs (Prednisone) .... 4 each am x 2days, 2x2days, 1x2days and stop  Patient Instructions: 1)  stop spiriva, atenolol and fexfenidine (allegra) 2)  Prednisone x 6 days 3)  Pepcid 20mg  at bedtime 4)  for nose drainage itching sneezing rec chlortrimeton 4 mg one 6 hours 5)  if not  improved x 2 weeks call [phone] ask for [facility] for sinus ct 6)  otherwise see [place] in one month 7)  GERD (REFLUX)  is a common cause of respiratory symptoms. It commonly presents without heartburn and can be treated with medication, but also with lifestyle changes including avoidance of late meals, excessive alcohol, smoking cessation, and avoid fatty foods, chocolate, peppermint, colas, red wine, and acidic juices such as orange juice. NO MINT OR MENTHOL PRODUCTS SO NO COUGH DROPS  8)  USE SUGARLESS CANDY INSTEAD (jolley ranchers)  9)  NO OIL BASED VITAMINS  Prescriptions: PREDNISONE 10 MG  TABS (PREDNISONE) 4 each am x 2days, 2x2days, 1x2days and stop  #14 x 0   Entered and Authorized  by:   Nyoka Cowden MD   Signed by:   Nyoka Cowden MD on 06/09/2010   Method used:   Electronically to        [facility] [address] (retail)       [address]       [place], [place]  [id number]       Ph: [phone]       Fax: [phone]   RxID:   [id number]

## 2010-12-23 NOTE — Miscellaneous (Signed)
Summary: RX for KCL  Clinical Lists Changes  Medications: Rx of POTASSIUM CHLORIDE CRYS CR 20 MEQ CR-TABS (POTASSIUM CHLORIDE CRYS CR) Take one tablet by mouth daily;  #30 x 11;  Signed;  Entered by: Charolotte Capuchin, RN;  Authorized by: Rollene Rotunda, MD, [facility];  Method used: Electronically to [facility] [address], [place], [place]  [id number], Ph: [phone], Fax: [phone]    Prescriptions: POTASSIUM CHLORIDE CRYS CR 20 MEQ CR-TABS (POTASSIUM CHLORIDE CRYS CR) Take one tablet by mouth daily  #30 x 11   Entered by:   Charolotte Capuchin, RN   Authorized by:   Rollene Rotunda, MD, [facility]   Signed by:   Charolotte Capuchin, RN on 08/12/2010   Method used:   Electronically to        [facility] (retail)       [address]       [place], [place]  [id number]       Ph: [phone]       Fax: [phone]   RxID:   [id number]

## 2010-12-23 NOTE — Assessment & Plan Note (Signed)
Summary: NP follow up - SOB, cough   Copy to:  Dr. Hyman Hopes Primary Provider/Referring Provider:  Marlowe Shores, MD  CC:  1 month follow up - states cough is resolved and SOB has improved but is still present and still having some increased edema in hands/feet/ankles though pt states that has improved as well.[name]  History of Present Illness: 65 yo former smoker (60 pk-yrs), hx DM, renal insuff, HTN. Referred by Dr Hyman Hopes for evaluation of cough. It began over a year ago, seemed to have started after a resp infection but then persisted. She has been having a lot of nasal gtt, clear drainage. Has also been having SOB with exertion, notices it when she walks the stairs. Doesn't hear wheezing. Dr Antoine Poche stopped Micardis in hopes that it would resolve the cough - she is better off of the med (less frequent), but still coughing. She was started on flonase also. Takes nexium, but only as needed.     ROV 05/26/09 -- Returns today for f/u of PFT's. Started fexafenodine + NSW + Nasacort. Still with some exertional SOB, has to stop when climbing stairs, doing housework.   July 17, 2009--Returns for follow up. last visit continued  on rhinitis meds w/ nasacort and allegra. Ventolin added for dyspnea as needed.. Returns today improved.  Cough is some better. No dyspnea since last visit, has not used ventolin . Denies chest pain, dyspnea, orthopnea, hemoptysis, fever, n/v/d, edema, headache.   November 11, 2009--Present for work in visit. Complains of dry cough occ producing white mucus, some increased SOB - states cough has never improved since she began coming to this office.  would like refill on tussionex. She continues to cough throughout day, Waxes and wanes. Cough is wearing her down.   November 30, 2009--Returns for 1 month follow up - states cough is resolved, SOB has improved but is still present and still having some increased edema in hands/feet/ankles though pt states that has improved as well. Last visit  with flare of cough . Tx w/ aggressive rhinitis and Gerd prevention. CXR showed increased interstitial markings. She was seen by Cards, Hochrein, last week Lasix was increased w/ decreased edema. Weight is down 3 lbs. Denies chest pain, dyspnea, orthopnea, hemoptysis, fever, n/v/d.   Medications Prior to Update: 1)  Aspirin 81 Mg Tabs (Aspirin) .... One By Mouth Daily 2)  Atenolol 100 Mg Tabs (Atenolol) .... One By Mouth Daily 3)  Clonidine Hcl 0.2 Mg Tabs (Clonidine Hcl) .[name].. 1 Tab By Mouth Three Times A Day 4)  Furosemide 40 Mg Tabs (Furosemide) .... One By Mouth Two Times A Day 5)  Lantus 100 Unit/ml Soln (Insulin Glargine) .... 45 Units Am and 25 Units At Bedtime 6)  Humalog 100 Unit/ml Soln (Insulin Lispro (Human)) .[name].. 10 Units Once Daily 7)  Lyrica 50 Mg Caps (Pregabalin) .... One By Mouth Two Times A Day 8)  Nexium 40 Mg Pack (Esomeprazole Magnesium) .... One By Mouth Daily 9)  Vytorin 10-80 Mg Tabs (Ezetimibe-Simvastatin) .... One By Mouth Daily 10)  Citalopram Hydrobromide 40 Mg Tabs (Citalopram Hydrobromide) .... One By Mouth Daily 11)  Nasacort Aq 55 Mcg/act Aers (Triamcinolone Acetonide(Nasal)) .... 2 Sprays Each Side Once Daily 12)  Fexofenadine Hcl 180 Mg Tabs (Fexofenadine Hcl) .[name].. 1 By Mouth Once Daily 13)  Vitamin D3 2000 Unit Caps (Cholecalciferol) .... Once Daily 14)  Vitamin B-12 1000 Mcg Tabs (Cyanocobalamin) .[name].. 1 Tab Daily 15)  Tessalon 200 Mg Caps (Benzonatate) .[name].. 1 By Mouth Three  Times A Day As Needed Cough 16)  Tramadol Hcl 50 Mg Tabs (Tramadol Hcl) .[name].. 1 By Mouth Every 4 Hr As Needed 17)  Fexofenadine Hcl 180 Mg Tabs (Fexofenadine Hcl) .[name].. 1 Tab By Mouth Once Daily 18)  Ventolin Hfa 108 (90 Base) Mcg/act Aers (Albuterol Sulfate) .... As Needed 19)  Zemplar 1 Mcg Caps (Paricalcitol) .... 3 Times Weekly 20)  Tums 500 Mg Chew (Calcium Carbonate Antacid) .... With Meals  Current Medications (verified): 1)  Aspirin 81 Mg Tabs (Aspirin) .... One By Mouth Daily 2)   Atenolol 100 Mg Tabs (Atenolol) .... One By Mouth Daily 3)  Clonidine Hcl 0.2 Mg Tabs (Clonidine Hcl) .[name].. 1 Tab By Mouth Three Times A Day 4)  Furosemide 40 Mg Tabs (Furosemide) .... One By Mouth Two Times A Day 5)  Lantus 100 Unit/ml Soln (Insulin Glargine) .... 45 Units Am and 25 Units At Bedtime 6)  Humalog 100 Unit/ml Soln (Insulin Lispro (Human)) .[name].. 10 Units Once Daily 7)  Lyrica 50 Mg Caps (Pregabalin) .... One By Mouth Two Times A Day 8)  Nexium 40 Mg Pack (Esomeprazole Magnesium) .... One By Mouth Daily 9)  Vytorin 10-80 Mg Tabs (Ezetimibe-Simvastatin) .... One By Mouth Daily 10)  Citalopram Hydrobromide 40 Mg Tabs (Citalopram Hydrobromide) .... One By Mouth Daily 11)  Nasacort Aq 55 Mcg/act Aers (Triamcinolone Acetonide(Nasal)) .... 2 Sprays Each Side Once Daily 12)  Fexofenadine Hcl 180 Mg Tabs (Fexofenadine Hcl) .[name].. 1 By Mouth Once Daily 13)  Vitamin D3 2000 Unit Caps (Cholecalciferol) .... Once Daily 14)  Vitamin B-12 1000 Mcg Tabs (Cyanocobalamin) .[name].. 1 Tab Daily 15)  Tessalon 200 Mg Caps (Benzonatate) .[name].. 1 By Mouth Three Times A Day As Needed Cough 16)  Tramadol Hcl 50 Mg Tabs (Tramadol Hcl) .[name].. 1 By Mouth Every 4 Hr As Needed 17)  Fexofenadine Hcl 180 Mg Tabs (Fexofenadine Hcl) .[name].. 1 Tab By Mouth Once Daily 18)  Ventolin Hfa 108 (90 Base) Mcg/act Aers (Albuterol Sulfate) .... As Needed 19)  Zemplar 1 Mcg Caps (Paricalcitol) .... 3 Times Weekly 20)  Tums 500 Mg Chew (Calcium Carbonate Antacid) .... With Meals  Allergies (verified): 1)  ! * Codiene  Past History:  Past Medical History: Last updated: 02/04/2009  1. Diabetes mellitus type 2.   2. Gastroesophageal reflux disease.   3. Chronic renal insufficiency.   4. Degenerative joint disease.   5. Depression.   6. Hypertension.   7 CHF with a well preserved EF  Past Surgical History: Last updated: 02/03/2009 none  Family History: Last updated: 04/24/2009 Positive for diabetes and hypertension.    cancer-sister heart disease-father  Social History: Last updated: 11/11/2009  Patient is a former smoker, smokes one pack a day for 30+   years.  She is a nondrinker.  married and lives with husband 2 children 1 dog retired occupation-nursing asst   Risk Factors: Alcohol Use: 0 (04/24/2009)  Risk Factors: Smoking Status: quit (04/24/2009) Packs/Day: 2.0 (04/24/2009)  Review of Systems      See HPI  Vital Signs:  Patient profile:   65 year old female Height:      65 inches Weight:      218.13 pounds BMI:     36.43 O2 Sat:      95 % on Room air Temp:     97.8 degrees F oral Pulse rate:   48 / minute BP sitting:   128 / 70  (right arm) Cuff size:   large  Vitals Entered  By: Boone Master CNA (November 30, 2009 10:30 AM)  O2 Flow:  Room air CC: 1 month follow up - states cough is resolved, SOB has improved but is still present and still having some increased edema in hands/feet/ankles though pt states that has improved as well. Is Patient Diabetic? Yes Comments Medications reviewed with patient Daytime contact number verified with patient. Boone Master CNA  November 30, 2009 10:34 AM    Physical Exam  Additional Exam:  GEN: A/Ox3; pleasant , NAD HEENT:  [place]/AT, , EACs-clear, TMs-wnl, NOSE-clear, THROAT-clear NECK:  Supple w/ fair ROM; no JVD; normal carotid impulses w/o bruits; no thyromegaly or nodules palpated; no lymphadenopathy. RESP  Clear to P & A; decreased BS in bases  CARD:  RRR, no m/r/g   GI:   Soft & nt; nml bowel sounds; no organomegaly or masses detected. Musco: Warm bil,  no calf tenderness, clubbing, pulses intact, 1+edema -support stocking on.  Neuro: intact w/ no focal deficits.    Impression & Recommendations:  Problem # 1:  COUGH (ICD-786.2)  Suspect is multifactoral in nature.  Improved w/ treatment aimed at Rhinitis and GERD prevention.  Continue on present regimen.  follow up Dr. Delton Coombes in 2 months and as needed   Orders: Est.  Patient Level III ([address])  Problem # 2:  CHRONIC DIASTOLIC HEART FAILURE (ICD-428.32)  cont follow up w/ cards as recommended.   Her updated medication list for this problem includes:    Aspirin 81 Mg Tabs (Aspirin) ..... One by mouth daily    Atenolol 100 Mg Tabs (Atenolol) ..... One by mouth daily    Furosemide 40 Mg Tabs (Furosemide) ..... One by mouth two times a day  Orders: Est. Patient Level III ([address]454)  Problem # 3:  DYSPNEA (ICD-786.05)  Multifactoral w/ known chronic diastolic dysfunction.  Currently stable. will cont to follow.  intermittent vol. overload may contribute to her dyspnea,  rec cont follow up cardiology with managment of her diuresis.   Orders: Est. Patient Level III (09811)  Complete Medication List: 1)  Aspirin 81 Mg Tabs (Aspirin) .... One by mouth daily 2)  Atenolol 100 Mg Tabs (Atenolol) .... One by mouth daily 3)  Clonidine Hcl 0.2 Mg Tabs (Clonidine hcl) .[name].. 1 tab by mouth three times a day 4)  Furosemide 40 Mg Tabs (Furosemide) .... One by mouth two times a day 5)  Lantus 100 Unit/ml Soln (Insulin glargine) .... 45 units am and 25 units at bedtime 6)  Humalog 100 Unit/ml Soln (Insulin lispro (human)) .[name].. 10 units once daily 7)  Lyrica 50 Mg Caps (Pregabalin) .... One by mouth two times a day 8)  Nexium 40 Mg Pack (Esomeprazole magnesium) .... One by mouth daily 9)  Vytorin 10-80 Mg Tabs (Ezetimibe-simvastatin) .... One by mouth daily 10)  Citalopram Hydrobromide 40 Mg Tabs (Citalopram hydrobromide) .... One by mouth daily 11)  Nasacort Aq 55 Mcg/act Aers (Triamcinolone acetonide(nasal)) .... 2 sprays each side once daily 12)  Fexofenadine Hcl 180 Mg Tabs (Fexofenadine hcl) .[name].. 1 by mouth once daily 13)  Vitamin D3 2000 Unit Caps (Cholecalciferol) .... Once daily 14)  Vitamin B-12 1000 Mcg Tabs (Cyanocobalamin) .[name].. 1 tab daily 15)  Tessalon 200 Mg Caps (Benzonatate) .[name].. 1 by mouth three times a day as needed cough 16)  Tramadol Hcl 50 Mg  Tabs (Tramadol hcl) .[name].. 1 by mouth every 4 hr as needed 17)  Fexofenadine Hcl 180 Mg Tabs (Fexofenadine hcl) .[name].. 1 tab by mouth once  daily 18)  Ventolin Hfa 108 (90 Base) Mcg/act Aers (Albuterol sulfate) .... As needed 19)  Zemplar 1 Mcg Caps (Paricalcitol) .... 3 times weekly 20)  Tums 500 Mg Chew (Calcium carbonate antacid) .... With meals  Patient Instructions: 1)  follow up Dr. Delton Coombes in 6-8 weeks 2)  Continue on same meds.  3)  call sooner if needed.  4)  Delsym 2 tsp every 12 hrs as needed for cough.  5)  Tessalon three times a day for cough  6)  Tramadol 50mg  1 every 4 hr as needed if still coughing.  7)  Continue fexofenadine once daily  8)  Continue on Pepcid 20mg  at bedtime  9)  Take  Nexium 40mg  in morning before breakfast.  10)  Use sugarless candy, ice chips, water to avoid throat clearing and cough, NO MINTS 11)  Try not to cough or clear throat.

## 2010-12-23 NOTE — Assessment & Plan Note (Signed)
Summary: f/u nurse visit/sl  Medications Added LIPITOR 40 MG TABS (ATORVASTATIN CALCIUM) Take one tablet by mouth daily.      Allergies Added:   Visit Type:  Follow-up Referring Provider:  Dr. Hyman Hopes Primary Provider:  Marlowe Shores, MD  CC:  Edema.  History of Present Illness: The patient presents for followup of volume overload and bradycardia. She's been seen a couple of times since a recent hospitalization. Meds have been adjusted to treat increased volume. At the last appointment with Dr. Tenny Craw she was given a dose of Zaroxolyn because of volume overload. She has lost 5 pounds since that appointment. She says she's feeling much better. She does have chronic O2 because of chronic lung disease as well as diastolic heart failure. She is not having chest pressure, neck or arm discomfort. She is not describing any PND or orthopnea. She has continued edema but thinks it's improved. She's not noticing any palpitations, presyncope or syncope. She does have a bit of a cough nonproductive. She's had no symptomatic bradycardia arrhythmias.  Current Medications (verified): 1)  Aspirin 81 Mg Tabs (Aspirin) .... One By Mouth Daily 2)  Clonidine Hcl 0.3 Mg Tabs (Clonidine Hcl) .... 1/2 Tab Two Times A Day 3)  Furosemide 40 Mg Tabs (Furosemide) .... Two By Mouth Two Times A Day 4)  Lantus 100 Unit/ml Soln (Insulin Glargine) .... 50 Units Am and 30 Units At Bedtime 5)  Humalog 100 Unit/ml Soln (Insulin Lispro (Human)) .... Ssi 6)  Lyrica 50 Mg Caps (Pregabalin) .... One By Mouth Two Times A Day 7)  Nexium 40 Mg Pack (Esomeprazole Magnesium) .... Take  One 30-60 Min Before First Meal of The Day 8)  Vytorin 10-80 Mg Tabs (Ezetimibe-Simvastatin) .... One By Mouth Daily 9)  Citalopram Hydrobromide 40 Mg Tabs (Citalopram Hydrobromide) .... One By Mouth Daily 10)  Nasacort Aq 55 Mcg/act Aers (Triamcinolone Acetonide(Nasal)) .... 2 Sprays Each Side Once Daily 11)  Vitamin D3 2000 Unit Caps (Cholecalciferol)  .... Once Daily 12)  Vitamin B-12 1000 Mcg Tabs (Cyanocobalamin) .Marland Kitchen.. 1 Tab Daily 13)  Tramadol Hcl 50 Mg Tabs (Tramadol Hcl) .Marland Kitchen.. 1 By Mouth Every 4 Hr As Needed 14)  Ventolin Hfa 108 (90 Base) Mcg/act Aers (Albuterol Sulfate) .... As Needed 15)  Zemplar 1 Mcg Caps (Paricalcitol) .... 3 Times Weekly 16)  Tums 500 Mg Chew (Calcium Carbonate Antacid) .... With Meals 17)  Pepcid Ac Maximum Strength 20 Mg Tabs (Famotidine) .... One At Bedtime 18)  Cardizem Cd 300 Mg Xr24h-Cap (Diltiazem Hcl Coated Beads) .Marland Kitchen.. 1 By Mouth Daily 19)  Carvedilol 6.25 Mg Tabs (Carvedilol) .Marland Kitchen.. 1 Tab By Mouth Two Times A Day 20)  Potassium Chloride Crys Cr 20 Meq Cr-Tabs (Potassium Chloride Crys Cr) .... Take One Tablet By Mouth Daily 21)  Metolazone 5 Mg Tabs (Metolazone) .... Take One Tablet By Mouth Daily If Needed  Allergies (verified): 1)  ! * Codiene  Past History:  Past Medical History:  1. Diabetes mellitus type 2.   2. Gastroesophageal reflux disease.   3. Chronic renal insufficiency.   4. Degenerative joint disease.   5. Depression.   6. Hypertension   7 CHF with a well preserved EF  8 Chronic cough     - Try off  Atenolol and spiriva June 09, 2010   Review of Systems       As stated in the HPI and negative for all other systems.   Vital Signs:  Patient profile:   65 year old female  Height:      64.5 inches Weight:      222 pounds BMI:     37.65 Pulse rate:   60 / minute Resp:     18 per minute BP sitting:   171 / 85  (right arm)  Vitals Entered By: Marrion Coy, CNA (August 11, 2010 2:11 PM)  Physical Exam  General:  Well developed, well nourished, in no acute distress. Head:  normocephalic and atraumatic Eyes:  PERRLA/EOM intact; conjunctiva and lids normal. Neck:  Neck supple, no JVD. No masses, thyromegaly or abnormal cervical nodes. Chest Wall:  no deformities or breast masses noted Lungs:  Decreased breath sounds bilaterally Heart:  , S1 and S2 within normal limits, no  S3, no S4, no clicks, no rubs, no murmurs. Abdomen:  Distended and somewhat tense but no rebound or guarding, no hepatomegaly, no splenomegaly, no midline pulsatile mass Msk:  Back normal, normal gait. Muscle strength and tone normal. Pulses:  2+ upper pulses, diminished dorsalis pedis and posterior rales bilaterally Extremities:  Firm swelling in bilateral lower extremities Neurologic:  Alert and oriented x 3. Skin:  Intact without lesions or rashes. Cervical Nodes:  no significant adenopathy Axillary Nodes:  no significant adenopathy Psych:  Normal affect.   Impression & Recommendations:  Problem # 1:  CHRONIC DIASTOLIC HEART FAILURE (ICD-428.32) Is a difficult balance between her renal function and volume overload. She is comfortable today. Therefore, she'll continue on the meds as listed but I will be checking a basic metabolic profile. Orders: TLB-BMP (Basic Metabolic Panel-BMET) (80048-METABOL)  Problem # 2:  BRADYCARDIA (ICD-427.89) This seems to be resolved though her heart rate is still low. We need to be careful of any increased AV nodal blocking agents.  Problem # 3:  RENAL INSUFFICIENCY (ICD-588.9) As above I will check a basic metabolic profile. I reviewed the labs from her most recent hospitalization.  Problem # 4:  HYPERTENSION, UNSPECIFIED (ICD-401.9) Blood pressure is elevated today but she reports that it is low usually. I have asked her to keep a blood pressure diary before I titrate meds further.  Problem # 5:  DYSLIPIDEMIA (ICD-272.4) She needs to be switched off of the simvastatin according to FDA warnings. I'll put her on Lipitor 40 mg daily.  Patient Instructions: 1)  Stop Vytorin 2)  Start Lipitor 40mg  daily 3)  Labs today 4)  Follow up in 1 month Prescriptions: LIPITOR 40 MG TABS (ATORVASTATIN CALCIUM) Take one tablet by mouth daily.  #30 x 6   Entered by:   Meredith Staggers, RN   Authorized by:   Rollene Rotunda, MD, [facility]   Signed by:   Meredith Staggers, RN  on 08/11/2010   Method used:   Electronically to        [facility] (retail)       [address]       [place], [place]  [id number]       Ph: [phone]       Fax: [phone]   RxID:   [id number]  I have reviewed and approved all prescriptions at the time of this visit. Rollene Rotunda, MD, [facility]  August 11, 2010 3:00 PM

## 2010-12-23 NOTE — Letter (Signed)
Summary: Certificate of Medical Necessity   Certificate of Medical Necessity   Imported By: Roderic Ovens 08/20/2010 16:15:59  _____________________________________________________________________  External Attachment:    Type:   Image     Comment:   External Document

## 2010-12-23 NOTE — Assessment & Plan Note (Signed)
Summary: EPH/JML  Medications Added CLONIDINE HCL 0.3 MG TABS (CLONIDINE HCL) 1/2 tab two times a day CARVEDILOL 6.25 MG TABS (CARVEDILOL) 1 tab by mouth two times a day POTASSIUM CHLORIDE CRYS CR 20 MEQ CR-TABS (POTASSIUM CHLORIDE CRYS CR) Take one tablet by mouth daily METOLAZONE 5 MG TABS (METOLAZONE) Take one tablet by mouth daily if needed      Allergies Added:   Visit Type:  Follow-up Referring Provider:  Dr. Hyman Hopes Primary Provider:  Marlowe Shores, MD   History of Present Illness: This is a 65 year old African American female patient who had 2 recent admissions with acute on chronic diastolic heart failure. She also has chronic renal insufficiency with creatinines between 3.4 and 4.5. Carvedilol was added to her medications.  The patient continues to have a lot of fluid on board. She saw Dr. Hyman Hopes on Monday who added metolazone to be taken if she gained 4 pounds overnight. He also increased her Lasix from 60 mg b.i.d. to 80 mg b.i.d.  The patient continues to be short of breath, that she could hardly walk yesterday because she was so swollen, and feels very weak and sluggish. She denies chest pain, palpitations, dizziness, or presyncope.  Current Medications (verified): 1)  Aspirin 81 Mg Tabs (Aspirin) .... One By Mouth Daily 2)  Clonidine Hcl 0.3 Mg Tabs (Clonidine Hcl) .... Two Times A Day 3)  Furosemide 40 Mg Tabs (Furosemide) .... One By Mouth Two Times A Day 4)  Lantus 100 Unit/ml Soln (Insulin Glargine) .... 50 Units Am and 30 Units At Bedtime 5)  Humalog 100 Unit/ml Soln (Insulin Lispro (Human)) .... Ssi 6)  Lyrica 50 Mg Caps (Pregabalin) .... One By Mouth Two Times A Day 7)  Nexium 40 Mg Pack (Esomeprazole Magnesium) .... Take  One 30-60 Min Before First Meal of The Day 8)  Vytorin 10-80 Mg Tabs (Ezetimibe-Simvastatin) .... One By Mouth Daily 9)  Citalopram Hydrobromide 40 Mg Tabs (Citalopram Hydrobromide) .... One By Mouth Daily 10)  Nasacort Aq 55 Mcg/act Aers  (Triamcinolone Acetonide(Nasal)) .... 2 Sprays Each Side Once Daily 11)  Vitamin D3 2000 Unit Caps (Cholecalciferol) .... Once Daily 12)  Vitamin B-12 1000 Mcg Tabs (Cyanocobalamin) .Marland Kitchen.. 1 Tab Daily 13)  Tessalon 200 Mg Caps (Benzonatate) .Marland Kitchen.. 1 By Mouth Three Times A Day As Needed Cough 14)  Tramadol Hcl 50 Mg Tabs (Tramadol Hcl) .Marland Kitchen.. 1 By Mouth Every 4 Hr As Needed 15)  Ventolin Hfa 108 (90 Base) Mcg/act Aers (Albuterol Sulfate) .... As Needed 16)  Zemplar 1 Mcg Caps (Paricalcitol) .... 3 Times Weekly 17)  Tums 500 Mg Chew (Calcium Carbonate Antacid) .... With Meals 18)  Pepcid Ac Maximum Strength 20 Mg Tabs (Famotidine) .... One At Bedtime 19)  Cardizem Cd 300 Mg Xr24h-Cap (Diltiazem Hcl Coated Beads) .Marland Kitchen.. 1 By Mouth Daily 20)  Carvedilol 6.25 Mg Tabs (Carvedilol) .... 3 By Mouth Two Times A Day 21)  Potassium Chloride Crys Cr 20 Meq Cr-Tabs (Potassium Chloride Crys Cr) .... Take One Tablet By Mouth Daily 22)  Metolazone 5 Mg Tabs (Metolazone) .... Take One Tablet By Mouth Daily If Needed  Allergies (verified): 1)  ! * Codiene  Past History:  Past Medical History: Last updated: 06/09/2010  1. Diabetes mellitus type 2.   2. Gastroesophageal reflux disease.   3. Chronic renal insufficiency.   4. Degenerative joint disease.   5. Depression.   6. Hypertension   7 CHF with a well preserved EF 8 Chronic cough     -  Try off  Atenolol and spiriva June 09, 2010   Review of Systems       see history of present illness  Vital Signs:  Patient profile:   65 year old female Height:      64.5 inches Weight:      240 pounds BMI:     40.71 Pulse rate:   56 / minute BP sitting:   112 / 58  (left arm)  Vitals Entered By: Laurance Flatten CMA (July 30, 2010 11:59 AM)  Physical Exam  General:   Well-nournished, in no acute distress. Neck: No JVD, HJR, Bruit, or thyroid enlargement Lungs: Decreased breath sounds throughout, clear without wheezing, rales, or rhonchi Cardiovascular:  RRR, PMI not displaced, heart sounds normal, no murmurs, gallops, bruit, thrill, or heave. Abdomen: Tight and swollen,BS normal.  without organomegaly, masses, lesions or tenderness. Extremities: the edema worsened her thighs than in her lower legs,without cyanosis, clubbing. Good distal pulses bilateral SKin: Warm, no lesions or rashes  Musculoskeletal: No deformities Neuro: no focal signs    EKG  Procedure date:  07/30/2010  Findings:      junctional escape rhythm at 56 beats per minute  Impression & Recommendations:  Problem # 1:  CHRONIC DIASTOLIC HEART FAILURE (ICD-428.32) Phas significant diastolic heart failure with a large amount of fluid on board. I asked her to go ahead and take her metolazone when she goes home today. The following medications were removed from the medication list:    Bystolic 10 Mg Tabs (Nebivolol hcl) ..... Out of samples Her updated medication list for this problem includes:    Aspirin 81 Mg Tabs (Aspirin) ..... One by mouth daily    Furosemide 40 Mg Tabs (Furosemide) ..... One by mouth two times a day    Cardizem Cd 300 Mg Xr24h-cap (Diltiazem hcl coated beads) .Marland Kitchen... 1 by mouth daily    Carvedilol 6.25 Mg Tabs (Carvedilol) .Marland Kitchen... 1 tab by mouth two times a day    Metolazone 5 Mg Tabs (Metolazone) .Marland Kitchen... Take one tablet by mouth daily if needed  The following medications were removed from the medication list:    Bystolic 10 Mg Tabs (Nebivolol hcl) ..... Out of samples Her updated medication list for this problem includes:    Aspirin 81 Mg Tabs (Aspirin) ..... One by mouth daily    Furosemide 40 Mg Tabs (Furosemide) ..... One by mouth two times a day    Cardizem Cd 300 Mg Xr24h-cap (Diltiazem hcl coated beads) .Marland Kitchen... 1 by mouth daily    Carvedilol 6.25 Mg Tabs (Carvedilol) .Marland KitchenMarland KitchenMarland KitchenMarland Kitchen 3 by mouth two times a day    Metolazone 5 Mg Tabs (Metolazone) .Marland Kitchen... Take one tablet by mouth daily if needed  Problem # 2:  BRADYCARDIA (ICD-427.89) Patient is in a  junctional escape rhythm. I reviewed her EKGs with Dr. Huston Foley. We will hold her carvedilol tonight, decrease her carvedilol to 6.25 mg one tablet b.i.d., and decrease her clonidine to the 0.3 mg one half tablet b.i.d. Will have her come back for an EKG on Monday. I've asked her to go to the emergency room if she has any increase in her dyspnea, dizziness, or presyncope. The following medications were removed from the medication list:    Bystolic 10 Mg Tabs (Nebivolol hcl) ..... Out of samples Her updated medication list for this problem includes:    Aspirin 81 Mg Tabs (Aspirin) ..... One by mouth daily    Cardizem Cd 300 Mg Xr24h-cap (Diltiazem hcl coated beads) .Marland KitchenMarland KitchenMarland KitchenMarland Kitchen  1 by mouth daily    Carvedilol 6.25 Mg Tabs (Carvedilol) .Marland Kitchen... 1 tab by mouth two times a day  Problem # 3:  RENAL INSUFFICIENCY (ICD-588.9) Follow closely with Dr. Hyman Hopes. She will be difficult to manage with her heart failure and renal insufficiency. Orders: EKG w/ Interpretation (93000)  Problem # 4:  HYPERTENSION, UNSPECIFIED (ICD-401.9) blood pressure stable The following medications were removed from the medication list:    Bystolic 10 Mg Tabs (Nebivolol hcl) ..... Out of samples Her updated medication list for this problem includes:    Aspirin 81 Mg Tabs (Aspirin) ..... One by mouth daily    Clonidine Hcl 0.3 Mg Tabs (Clonidine hcl) .Marland Kitchen... 1/2 tab two times a day    Furosemide 40 Mg Tabs (Furosemide) ..... One by mouth two times a day    Cardizem Cd 300 Mg Xr24h-cap (Diltiazem hcl coated beads) .Marland Kitchen... 1 by mouth daily    Carvedilol 6.25 Mg Tabs (Carvedilol) .Marland Kitchen... 1 tab by mouth two times a day    Metolazone 5 Mg Tabs (Metolazone) .Marland Kitchen... Take one tablet by mouth daily if needed  Orders: EKG w/ Interpretation (93000)  Patient Instructions: 1)  Take a Metolazone when you get home today 2)  Hold your evening dose of Carvedilol today and then 3)  Decrease Carvedilol to 6.25mg  two times a day  4)  Decrease Clonidine to  1/2 tab two times a day  5)  If Shortness of Breathe increased or gets worse over the weekend please report to the Emergency Room 6)  Return ton Monday for a nurse visit to have an EKG 7)  Follow up with Dr Antoine Poche in 1 week

## 2010-12-23 NOTE — Letter (Signed)
Summary: Discharge Summary  Discharge Summary   Imported By: Earl Many 10/11/2010 14:41:26  _____________________________________________________________________  External Attachment:    Type:   Image     Comment:   External Document

## 2010-12-23 NOTE — Assessment & Plan Note (Signed)
Summary: rov/increased edema and SOB/debra  Medications Added CLONIDINE HCL 0.2 MG TABS (CLONIDINE HCL) 1 tab by mouth three times a day LANTUS 100 UNIT/ML SOLN (INSULIN GLARGINE) 45 units am and 25 units at bedtime HUMALOG 100 UNIT/ML SOLN (INSULIN LISPRO (HUMAN)) 10 units once daily FEXOFENADINE HCL 180 MG TABS (FEXOFENADINE HCL) 1 tab by mouth once daily VENTOLIN HFA 108 (90 BASE) MCG/ACT AERS (ALBUTEROL SULFATE) as needed ZEMPLAR 1 MCG CAPS (PARICALCITOL) 3 times weekly TUMS 500 MG CHEW (CALCIUM CARBONATE ANTACID) with meals        Visit Type:  Follow-up Referring Provider:  Dr. Hyman Hopes Primary Provider:  Marlowe Shores, MD  CC:  Edema.  History of Present Illness: The patient returns for followup of her heart failure with preserved ejection fraction. Since I last saw her she finally had improvement of her cough after treatment with benzonatate. However, she's had weight gain of about 10 pounds over short period of time and increased lower extremity edema. She was treated after calling this clinic with a few days of doubling her Lasix. Unfortunately her creatinine went from 2.2-2.8. She is again taking 40 mg b.i.d. of Lasix. Her weights have been slowly creeping up. She continues to have some mild shortness of brescribing he has had no palpitations, presyncope or syncope. She has had no chest pain, fevers or chills.  Current Medications (verified): 1)  Aspirin 81 Mg Tabs (Aspirin) .... One By Mouth Daily 2)  Atenolol 100 Mg Tabs (Atenolol) .... One By Mouth Daily 3)  Clonidine Hcl 0.2 Mg Tabs (Clonidine Hcl) .Marland Kitchen.. 1 Tab By Mouth Three Times A Day 4)  Furosemide 40 Mg Tabs (Furosemide) .... One By Mouth Two Times A Day 5)  Lantus 100 Unit/ml Soln (Insulin Glargine) .... 45 Units Am and 25 Units At Bedtime 6)  Humalog 100 Unit/ml Soln (Insulin Lispro (Human)) .Marland Kitchen.. 10 Units Once Daily 7)  Lyrica 50 Mg Caps (Pregabalin) .... One By Mouth Two Times A Day 8)  Nexium 40 Mg Pack (Esomeprazole  Magnesium) .... One By Mouth Daily 9)  Vytorin 10-80 Mg Tabs (Ezetimibe-Simvastatin) .... One By Mouth Daily 10)  Citalopram Hydrobromide 40 Mg Tabs (Citalopram Hydrobromide) .... One By Mouth Daily 11)  Nasacort Aq 55 Mcg/act Aers (Triamcinolone Acetonide(Nasal)) .... 2 Sprays Each Side Once Daily 12)  Fexofenadine Hcl 180 Mg Tabs (Fexofenadine Hcl) .Marland Kitchen.. 1 By Mouth Once Daily 13)  Vitamin D3 2000 Unit Caps (Cholecalciferol) .... Once Daily 14)  Vitamin B-12 1000 Mcg Tabs (Cyanocobalamin) .Marland Kitchen.. 1 Tab Daily 15)  Tessalon 200 Mg Caps (Benzonatate) .Marland Kitchen.. 1 By Mouth Three Times A Day As Needed Cough 16)  Tramadol Hcl 50 Mg Tabs (Tramadol Hcl) .Marland Kitchen.. 1 By Mouth Every 4 Hr As Needed 17)  Fexofenadine Hcl 180 Mg Tabs (Fexofenadine Hcl) .Marland Kitchen.. 1 Tab By Mouth Once Daily 18)  Ventolin Hfa 108 (90 Base) Mcg/act Aers (Albuterol Sulfate) .... As Needed 19)  Zemplar 1 Mcg Caps (Paricalcitol) .... 3 Times Weekly 20)  Tums 500 Mg Chew (Calcium Carbonate Antacid) .... With Meals  Past History:  Past Medical History: Reviewed history from 02/04/2009 and no changes required.  1. Diabetes mellitus type 2.   2. Gastroesophageal reflux disease.   3. Chronic renal insufficiency.   4. Degenerative joint disease.   5. Depression.   6. Hypertension.   7 CHF with a well preserved EF  Review of Systems       As stated in the HPI and negative for all other systems.  Vital Signs:  Patient profile:   65 year old female Height:      65 inches Weight:      221 pounds BMI:     36.91 Pulse rate:   53 / minute Resp:     12 per minute BP sitting:   146 / 80  (left arm)  Vitals Entered By: Kem Parkinson (November 24, 2009 2:55 PM)  Physical Exam  General:  Well developed, well nourished, in no acute distress. Head:  normocephalic and atraumatic Eyes:  PERRLA/EOM intact; conjunctiva and lids normal. Mouth:  Teeth, gums and palate normal. Oral mucosa normal. Neck:  Neck supple, no JVD. No masses, thyromegaly  or abnormal cervical nodes. Chest Wall:  no deformities or breast masses noted Lungs:  Clear bilaterally to auscultation and percussion. Abdomen:  Bowel sounds positive; abdomen soft and non-tender without masses, organomegaly, or hernias noted. No hepatosplenomegaly,  Msk:  Back normal, normal gait. Muscle strength and tone normal. Extremities:  significant bilateral edema to above the knees Neurologic:  Alert and oriented x 3. Skin:  Intact without lesions or rashes. Psych:  Normal affect.   Detailed Cardiovascular Exam  Neck    Carotids: Carotids full and equal bilaterally without bruits.      Neck Veins: Normal, no JVD.    Heart    Inspection: no deformities or lifts noted.      Palpation: normal PMI with no thrills palpable.      Auscultation: regular rate and rhythm, S1, S2 without murmurs, rubs, gallops, or clicks.    Vascular    Abdominal Aorta: no palpable masses, pulsations, or audible bruits.      Femoral Pulses: normal femoral pulses bilaterally.      Pedal Pulses: normal pedal pulses bilaterally.      Radial Pulses: normal radial pulses bilaterally.      Peripheral Circulation: no clubbing, cyanosis, or edema noted with normal capillary refill.     EKG  Procedure date:  11/24/2009  Findings:      sinus rhythm, rate 53, axis within normal limits, intervals within normal limits, no acute ST-T wave changes  Impression & Recommendations:  Problem # 1:  CHRONIC DIASTOLIC HEART FAILURE (ICD-428.32) Unfortunately she continues to have increasing edema complicated by renal insufficiency. I had a long discussion with the patient and her daughter. I still think she eats salt and I told her she needs a.c. rose salt diet (2 g) and a 1500 cc fluid restriction. I told her she absolutely needs to wear compression stockings. For 2 days she'll take Lasix 80 mg b.i.d. She'll go back to 40 b.i.d. after that. She will see her primary care doctor in mid January and we'll get another  basic metabolic profile. She will call us if her weights continue to increase.  Orders: EKG w/ Interpretation (93000)  Problem # 2:  RENAL INSUFFICIENCY (ICD-588.9) As A broad we will watch this very closely.  Problem # 3:  HYPERTENSION, UNSPECIFIED (ICD-401.9) Her blood pressure is slightly elevated today. It has been controlled at previous visits. I will titrate meds as indicated based on future readings. Orders: EKG w/ Interpretation (93000)  Patient Instructions: 1)  Your physician recommends that you schedule a follow-up appointment in: 6 weeks with Dr Antoine Poche 2)  Your physician recommends that you continue on your current medications as directed. Please refer to the Current Medication list given to you today.

## 2010-12-23 NOTE — Assessment & Plan Note (Signed)
Summary: 1 month rov.sl      Allergies Added:   Visit Type:  Follow-up Referring Provider:  Dr. Hyman Hopes Primary Provider:  Marlowe Shores, MD  CC:  Diastolic HF.  History of Present Illness: The patient presents for followup of diastolic heart failure. Since I last saw her she has been followed closely by Dr. Hyman Hopes.  She has stable renal insufficiency. He had been managing her diuretics. Though she has continued lower extremity edema and dyspnea with exertion she's not had any acute exacerbations.  She denies any new PND or orthopnea. She has had no new palpitations, presyncope or syncope. She denies any chest pressure, neck or arm discomfort.  Current Medications (verified): 1)  Aspirin 81 Mg Tabs (Aspirin) .... One By Mouth Daily 2)  Clonidine Hcl 0.3 Mg Tabs (Clonidine Hcl) .... 1/2 Tab Two Times A Day 3)  Furosemide 40 Mg Tabs (Furosemide) .... Two By Mouth Two Times A Day 4)  Lantus 100 Unit/ml Soln (Insulin Glargine) .... 50 Units Am and 30 Units At Bedtime 5)  Humalog 100 Unit/ml Soln (Insulin Lispro (Human)) .... Ssi 6)  Lyrica 50 Mg Caps (Pregabalin) .... One By Mouth Two Times A Day 7)  Nexium 40 Mg Pack (Esomeprazole Magnesium) .... Take  One 30-60 Min Before First Meal of The Day 8)  Lipitor 40 Mg Tabs (Atorvastatin Calcium) .... Take One Tablet By Mouth Daily. 9)  Citalopram Hydrobromide 40 Mg Tabs (Citalopram Hydrobromide) .... One By Mouth Daily 10)  Nasacort Aq 55 Mcg/act Aers (Triamcinolone Acetonide(Nasal)) .... 2 Sprays Each Side Once Daily 11)  Vitamin D3 2000 Unit Caps (Cholecalciferol) .... Once Daily 12)  Vitamin B-12 1000 Mcg Tabs (Cyanocobalamin) .Marland Kitchen.. 1 Tab Daily 13)  Tramadol Hcl 50 Mg Tabs (Tramadol Hcl) .Marland Kitchen.. 1 By Mouth Every 4 Hr As Needed 14)  Ventolin Hfa 108 (90 Base) Mcg/act Aers (Albuterol Sulfate) .... As Needed 15)  Zemplar 1 Mcg Caps (Paricalcitol) .... 3 Times Weekly 16)  Tums 500 Mg Chew (Calcium Carbonate Antacid) .... With Meals 17)  Pepcid Ac Maximum  Strength 20 Mg Tabs (Famotidine) .... One At Bedtime 18)  Cardizem Cd 300 Mg Xr24h-Cap (Diltiazem Hcl Coated Beads) .Marland Kitchen.. 1 By Mouth Daily 19)  Carvedilol 6.25 Mg Tabs (Carvedilol) .Marland Kitchen.. 1 Tab By Mouth Two Times A Day 20)  Potassium Chloride Crys Cr 20 Meq Cr-Tabs (Potassium Chloride Crys Cr) .... Take One Tablet By Mouth Daily 21)  Metolazone 5 Mg Tabs (Metolazone) .... Take One Tablet By Mouth Daily If Needed  Allergies (verified): 1)  ! * Codiene  Past History:  Past Medical History: Reviewed history from 08/11/2010 and no changes required.  1. Diabetes mellitus type 2.   2. Gastroesophageal reflux disease.   3. Chronic renal insufficiency.   4. Degenerative joint disease.   5. Depression.   6. Hypertension   7 CHF with a well preserved EF  8 Chronic cough     - Try off  Atenolol and spiriva June 09, 2010   Review of Systems       As stated in the HPI and negative for all other systems.   Vital Signs:  Patient profile:   65 year old female Height:      64.5 inches Weight:      226 pounds BMI:     38.33 Pulse rate:   59 / minute Resp:     16 per minute BP sitting:   128 / 68  (right arm)  Vitals Entered By: Neysa Bonito  Cristela Felt, CNA (September 14, 2010 9:02 AM)  Physical Exam  General:  Well developed, well nourished, in no acute distress. Head:  normocephalic and atraumatic Neck:  Neck supple, no JVD. No masses, thyromegaly or abnormal cervical nodes. Chest Wall:  no deformities or breast masses noted Lungs:  Decreased breath sounds bilaterally Heart:  S1 and S2 within normal limits, no S3, no S4, no clicks, no rubs, no murmurs. Abdomen:  Distended and somewhat tense but no rebound or guarding, no hepatomegaly, no splenomegaly, no midline pulsatile mass Msk:  Back normal, normal gait. Muscle strength and tone normal. Pulses:  2+ upper pulses, diminished dorsalis pedis and posterior rales bilaterally Extremities:  Firm swelling in bilateral lower extremities Neurologic:   Alert and oriented x 3. Skin:  Intact without lesions or rashes. Cervical Nodes:  no significant adenopathy Psych:  Normal affect.   Impression & Recommendations:  Problem # 1:  DYSPNEA (ICD-786.05) The patient has diastolic heart failure. Her dyspnea is at baseline and she is having no acute symptoms. She is being followed closely by her nephrologist and I will put her to his volume management. At this point I have no suggestions for changing her therapy.  Problem # 2:  BRADYCARDIA (ICD-427.89) She has had no symptomatic bradycardia arrhythmias and tolerates the meds as listed. No change in therapy is indicated.  Problem # 3:  HYPERTENSION, UNSPECIFIED (ICD-401.9) Her blood pressure is controlled on the current complex regimen. She will continue these meds as listed.  Patient Instructions: 1)  Your physician recommends that you schedule a follow-up appointment in: 1 year with Dr Antoine Poche 2)  Your physician recommends that you continue on your current medications as directed. Please refer to the Current Medication list given to you today.

## 2010-12-23 NOTE — Assessment & Plan Note (Signed)
Summary: dyspnea, COPD, diastolic dysfxn   Visit Type:  Follow-up Copy to:  Dr. Hyman Hopes Primary Provider/Referring Provider:  Marlowe Shores, MD  CC:  Dyspnea and cough follow-up. Echo results. The patient c/o increased sob with exertion x1 week and she still has a productive cough..  History of Present Illness: 65 yo former smoker (60 pk-yrs), hx DM, renal insuff, HTN. Referred by Dr Hyman Hopes for evaluation of cough. It began over a year ago, seemed to have started after a resp infection but then persisted in setting GERD and PND. Has also been having SOB with exertion, notices it when she walks the stairs. Slight improvement off Micardis.  November 30, 2009--Returns for 1 month follow up - states cough is resolved, SOB has improved but is still present and still having some increased edema in hands/feet/ankles though pt states that has improved as well. Last visit with flare of cough . Tx w/ aggressive rhinitis and Gerd prevention. CXR showed increased interstitial markings. She was seen by Cards, Hochrein, last week Lasix was increased w/ decreased edema. Weight is down 3 lbs. Denies chest pain, dyspnea, orthopnea, hemoptysis, fever, n/v/d.   ROV 01/08/10 -- returns for f/u. Still with occas cough. Tells me that she continues to have exertional SOB, especially climbing stairs. She feels she has been "filling up with fluid". Dr Antoine Poche has been balancing volume status with renal fxn with lasix dosing. She has gained 3 lbs since last visit. Currently back on her baseline lasix dose 40mg  two times a day. Still with nasal gtt, uses fexofenadine + Nasocort. Doesn't do NSWs anymore. She has tried SABA, believes that it helped.   Current Medications (verified): 1)  Aspirin 81 Mg Tabs (Aspirin) .... One By Mouth Daily 2)  Atenolol 100 Mg Tabs (Atenolol) .... One By Mouth Daily 3)  Clonidine Hcl 0.2 Mg Tabs (Clonidine Hcl) .Marland Kitchen.. 1 Tab By Mouth Three Times A Day 4)  Furosemide 40 Mg Tabs (Furosemide) .... One By  Mouth Two Times A Day 5)  Lantus 100 Unit/ml Soln (Insulin Glargine) .... 45 Units Am and 25 Units At Bedtime 6)  Humalog 100 Unit/ml Soln (Insulin Lispro (Human)) .Marland Kitchen.. 10 Units Once Daily 7)  Lyrica 50 Mg Caps (Pregabalin) .... One By Mouth Two Times A Day 8)  Nexium 40 Mg Pack (Esomeprazole Magnesium) .... One By Mouth Daily 9)  Vytorin 10-80 Mg Tabs (Ezetimibe-Simvastatin) .... One By Mouth Daily 10)  Citalopram Hydrobromide 40 Mg Tabs (Citalopram Hydrobromide) .... One By Mouth Daily 11)  Nasacort Aq 55 Mcg/act Aers (Triamcinolone Acetonide(Nasal)) .... 2 Sprays Each Side Once Daily 12)  Fexofenadine Hcl 180 Mg Tabs (Fexofenadine Hcl) .Marland Kitchen.. 1 By Mouth Once Daily 13)  Vitamin D3 2000 Unit Caps (Cholecalciferol) .... Once Daily 14)  Vitamin B-12 1000 Mcg Tabs (Cyanocobalamin) .Marland Kitchen.. 1 Tab Daily 15)  Tessalon 200 Mg Caps (Benzonatate) .Marland Kitchen.. 1 By Mouth Three Times A Day As Needed Cough 16)  Tramadol Hcl 50 Mg Tabs (Tramadol Hcl) .Marland Kitchen.. 1 By Mouth Every 4 Hr As Needed 17)  Fexofenadine Hcl 180 Mg Tabs (Fexofenadine Hcl) .Marland Kitchen.. 1 Tab By Mouth Once Daily 18)  Ventolin Hfa 108 (90 Base) Mcg/act Aers (Albuterol Sulfate) .... As Needed 19)  Zemplar 1 Mcg Caps (Paricalcitol) .... 3 Times Weekly 20)  Tums 500 Mg Chew (Calcium Carbonate Antacid) .... With Meals  Allergies (verified): 1)  ! * Codiene  Vital Signs:  Patient profile:   65 year old female Height:      65 inches (  165.10 cm) Weight:      221 pounds (100.45 kg) BMI:     36.91 O2 Sat:      90 % on Room air Temp:     97.9 degrees F (36.61 degrees C) oral Pulse rate:   52 / minute BP sitting:   122 / 60  (left arm) Cuff size:   large  Vitals Entered By: Michel Bickers CMA (January 08, 2010 9:01 AM)  O2 Sat at Rest %:  90 O2 Flow:  Room air  Physical Exam  General:  normal appearance, healthy appearing, and obese.   Head:  normocephalic and atraumatic Eyes:  conjunctiva and sclera clear Nose:  no deformity, discharge, inflammation, or  lesions Mouth:  no deformity or lesions, mild posterior pharyngeal erythema, upper dentures Neck:  no masses, thyromegaly, or abnormal cervical nodes Lungs:  clear bilaterally to auscultation Heart:  regular rate and rhythm, S1, S2 without murmurs, rubs, gallops, or clicks Abdomen:  not examined Msk:  no deformity or scoliosis noted with normal posture Extremities:  1+ edema, support hose on Neurologic:  non-focal Psych:  alert and cooperative; normal mood and affect; normal attention span and concentration   Echocardiogram  Procedure date:  12/29/2009  Findings:          Study Conclusions            - Left ventricle: The cavity size was normal. Wall thickness was       increased in a pattern of mild LVH. The estimated ejection       fraction was 60%. Wall motion was normal; there were no regional       wall motion abnormalities.     - Mitral valve: Mild regurgitation.     - Left atrium: The atrium was mildly dilated.     - Right atrium: The atrium was mildly dilated.     - Pulmonary arteries: PA peak pressure: 41mm Hg (S).     Transthoracic echocardiography. M-mode, complete 2D, spectral     Doppler, and color Doppler. Height: Height: 165.1cm. Height: 65in.     Weight: Weight: 100.2kg. Weight: 220.5lb. Body mass index: BMI:     36.8kg/m 2. Body surface area: BSA: 2.30m 2. Blood pressure: 118/68.     Patient status: Outpatient. Location: [facility]  Impression & Recommendations:  Problem # 1:  COUGH (ICD-786.2)  Orders: Est. Patient Level IV (16109)  Problem # 2:  DYSPNEA (ICD-786.05)  Suspect that at least a component of this is COPD, think she may respond to Spiriva given her improvement with SABA.  - trial of Spiriva  - rov to review any progress on the med - as needed SABA  Orders: Est. Patient Level IV (60454)  Problem # 3:  CHRONIC DIASTOLIC HEART FAILURE (ICD-428.32) Note TTE results.   Her updated medication list for this problem includes:     Aspirin 81 Mg Tabs (Aspirin) ..... One by mouth daily    Atenolol 100 Mg Tabs (Atenolol) ..... One by mouth daily    Furosemide 40 Mg Tabs (Furosemide) ..... One by mouth two times a day  Patient Instructions: 1)  Start Spiriva 1 inhalation once daily 2)  Follow up with Dr Delton Coombes in 1 month to discuss any improvement on this medication 3)  Continue your lasix and BP medications as directed by Dr Antoine Poche 4)  Continue your Nasacort spray and fexafenodine 5)  Use Ventolin 2 puffs up to every 4 hours if needed for shortness of  breath.

## 2010-12-23 NOTE — Letter (Signed)
Summary: [facility] Statement of Medical Necessity   [facility] Statement of Medical Necessity   Imported By: Roderic Ovens 10/04/2010 11:43:38  _____________________________________________________________________  External Attachment:    Type:   Image     Comment:   External Document

## 2010-12-23 NOTE — Assessment & Plan Note (Signed)
Summary: Sob/nm  Medications Added FUROSEMIDE 40 MG TABS (FUROSEMIDE) two by mouth two times a day        Visit Type:  Follow-up Referring Provider:  Dr. Hyman Hopes Primary Provider:  Marlowe Shores, MD   History of Present Illness: [name] is a 65 year old with a history of diastolic CHF and renal insufficiency.  She was recently hospitalized with volume overload.  She was seen on Friday by Leda Gauze in clinic.  She was volume overloaded.  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  She was told to decrease clonidine and Coreg.  She was also told to take her Zaroxyln.   Since making thes changes she has had improvemnt in her edema.   She denies dizziness.  Current Medications (verified): 1)  Aspirin 81 Mg Tabs (Aspirin) .... One By Mouth Daily 2)  Clonidine Hcl 0.3 Mg Tabs (Clonidine Hcl) .... 1/2 Tab Two Times A Day 3)  Furosemide 40 Mg Tabs (Furosemide) .... Two By Mouth Two Times A Day 4)  Lantus 100 Unit/ml Soln (Insulin Glargine) .... 50 Units Am and 30 Units At Bedtime 5)  Humalog 100 Unit/ml Soln (Insulin Lispro (Human)) .... Ssi 6)  Lyrica 50 Mg Caps (Pregabalin) .... One By Mouth Two Times A Day 7)  Nexium 40 Mg Pack (Esomeprazole Magnesium) .... Take  One 30-60 Min Before First Meal of The Day 8)  Vytorin 10-80 Mg Tabs (Ezetimibe-Simvastatin) .... One By Mouth Daily 9)  Citalopram Hydrobromide 40 Mg Tabs (Citalopram Hydrobromide) .... One By Mouth Daily 10)  Nasacort Aq 55 Mcg/act Aers (Triamcinolone Acetonide(Nasal)) .... 2 Sprays Each Side Once Daily 11)  Vitamin D3 2000 Unit Caps (Cholecalciferol) .... Once Daily 12)  Vitamin B-12 1000 Mcg Tabs (Cyanocobalamin) .Marland Kitchen.. 1 Tab Daily 13)  Tessalon 200 Mg Caps (Benzonatate) .Marland Kitchen.. 1 By Mouth Three Times A Day As Needed Cough 14)  Tramadol Hcl 50 Mg Tabs (Tramadol Hcl) .Marland Kitchen.. 1 By Mouth Every 4 Hr As Needed 15)  Ventolin Hfa 108 (90 Base) Mcg/act Aers (Albuterol Sulfate) .... As Needed 16)  Zemplar 1 Mcg Caps (Paricalcitol)  .... 3 Times Weekly 17)  Tums 500 Mg Chew (Calcium Carbonate Antacid) .... With Meals 18)  Pepcid Ac Maximum Strength 20 Mg Tabs (Famotidine) .... One At Bedtime 19)  Cardizem Cd 300 Mg Xr24h-Cap (Diltiazem Hcl Coated Beads) .Marland Kitchen.. 1 By Mouth Daily 20)  Carvedilol 6.25 Mg Tabs (Carvedilol) .Marland Kitchen.. 1 Tab By Mouth Two Times A Day 21)  Potassium Chloride Crys Cr 20 Meq Cr-Tabs (Potassium Chloride Crys Cr) .... Take One Tablet By Mouth Daily 22)  Metolazone 5 Mg Tabs (Metolazone) .... Take One Tablet By Mouth Daily If Needed  Allergies: 1)  ! * Codiene  Past History:  Past medical, surgical, family and social histories (including risk factors) reviewed, and no changes noted (except as noted below).  Past Medical History: Reviewed history from 06/09/2010 and no changes required.  1. Diabetes mellitus type 2.   2. Gastroesophageal reflux disease.   3. Chronic renal insufficiency.   4. Degenerative joint disease.   5. Depression.   6. Hypertension   7 CHF with a well preserved EF 8 Chronic cough     - Try off  Atenolol and spiriva June 09, 2010   Past Surgical History: Reviewed history from 02/03/2009 and no changes required. none  Family History: Reviewed history from 04/24/2009 and no changes required. Positive for diabetes and hypertension.  cancer-sister heart disease-father  Social  History: Reviewed history from 11/11/2009 and no changes required.  Patient is a former smoker, smokes one pack a day for 30+   years.  She is a nondrinker.  married and lives with husband 2 children 1 dog retired occupation-nursing asst   Vital Signs:  Patient profile:   65 year old female Height:      64.5 inches Weight:      227 pounds Pulse rate:   60 / minute Pulse rhythm:   regular BP sitting:   140 / 70  (right arm)  Vitals Entered By: Jacquelin Hawking, CMA (August 02, 2010 12:23 PM)  Physical Exam  Additional Exam:  Patient is in NAD HEENT:  Normocephalic, atraumatic. EOMI,  PERRLA.  Neck: JVP is normal.   Lungs: clear to auscultation. No rales no wheezes.  Heart: Regular rate and rhythm. Normal S1, S2. No S3.   No significant murmurs. PMI not displaced.  Abdomen:  Supple, nontender. Normal bowel sounds. No masses. No hepatomegaly.  Extremities:  Trace  lower extremity edema.  Musculoskeletal :moving all extremities.  Neuro:   alert and oriented x3.    EKG  Procedure date:  08/02/2010  Findings:      NSR.  65 bpm.    Impression & Recommendations:  Problem # 1:  BRADYCARDIA (ICD-427.89) Resolved with decrease in meds.  THis probably exacerbated her volume overload as her volume status has improved with this change alone.  Problem # 2:  CHRONIC DIASTOLIC HEART FAILURE (ICD-428.32) I would not make any changes in her regimn.  patient has f/u in 1 wk with J Hochrein.  Problem # 3:  RENAL INSUFFICIENCY (ICD-588.9) Followed by dr. Hyman Hopes.  Other Orders: EKG w/ Interpretation (93000)  Patient Instructions: 1)  Keep on same regimen.  F/U with J Hochrein as scheduled.

## 2010-12-23 NOTE — Progress Notes (Signed)
Summary: rx request  Phone Note Call from Patient Call back at Home Phone [phone]   Caller: Daughter Call For: Eldana Isip Summary of Call: pt's daughter says that dr Jerzy Crotteau gave pt a sample of spiriva. if it worked well for pt she could get a rx called in. it does and pt wants this called in to [facility] [address]. call pt at home # when this is called in please.  Initial call taken by: Tivis Ringer, CNA,  February 02, 2010 2:34 PM  Follow-up for Phone Call        rx called in. reminded pt of appt. Carron Curie CMA  February 02, 2010 3:10 PM     New/Updated Medications: SPIRIVA HANDIHALER 18 MCG CAPS (TIOTROPIUM BROMIDE MONOHYDRATE) inhale one capsule daily Prescriptions: SPIRIVA HANDIHALER 18 MCG CAPS (TIOTROPIUM BROMIDE MONOHYDRATE) inhale one capsule daily  #30 x 0   Entered by:   Carron Curie CMA   Authorized by:   Leslye Peer MD   Signed by:   Carron Curie CMA on 02/02/2010   Method used:   Electronically to        [facility] (retail)       [address]       [place], [place]  [id number]       Ph: [phone]       Fax: [phone]   RxID:   [id number]

## 2010-12-28 ENCOUNTER — Encounter (HOSPITAL_COMMUNITY): Payer: Self-pay

## 2011-01-04 ENCOUNTER — Other Ambulatory Visit: Payer: Self-pay

## 2011-01-04 ENCOUNTER — Other Ambulatory Visit: Payer: Self-pay | Admitting: Nephrology

## 2011-01-04 ENCOUNTER — Encounter (HOSPITAL_COMMUNITY): Payer: Medicare Other | Attending: Nephrology

## 2011-01-04 DIAGNOSIS — D638 Anemia in other chronic diseases classified elsewhere: Secondary | ICD-10-CM | POA: Insufficient documentation

## 2011-01-04 DIAGNOSIS — N184 Chronic kidney disease, stage 4 (severe): Secondary | ICD-10-CM | POA: Insufficient documentation

## 2011-01-04 LAB — IRON AND TIBC: Saturation Ratios: 22 % (ref 20–55)

## 2011-01-04 LAB — RENAL FUNCTION PANEL
GFR calc Af Amer: 16 mL/min — ABNORMAL LOW (ref >60)
Phosphorus: 5.4 mg/dL — ABNORMAL HIGH (ref 2.3–4.6)
Sodium: 143 mEq/L (ref 135–145)

## 2011-01-19 ENCOUNTER — Other Ambulatory Visit: Payer: Self-pay

## 2011-01-19 ENCOUNTER — Encounter (HOSPITAL_COMMUNITY): Payer: Medicare Other

## 2011-01-19 LAB — POCT HEMOGLOBIN-HEMACUE: Hemoglobin: 11.5 g/dL — ABNORMAL LOW (ref 12.0–15.0)

## 2011-01-25 ENCOUNTER — Other Ambulatory Visit: Payer: Self-pay | Admitting: Internal Medicine

## 2011-01-25 DIAGNOSIS — Z1231 Encounter for screening mammogram for malignant neoplasm of breast: Secondary | ICD-10-CM

## 2011-01-31 LAB — RENAL FUNCTION PANEL
Albumin: 3.2 g/dL — ABNORMAL LOW (ref 3.5–5.2)
BUN: 66 mg/dL — ABNORMAL HIGH (ref 6–23)
Chloride: 98 mEq/L (ref 96–112)
GFR calc non Af Amer: 11 mL/min — ABNORMAL LOW (ref >60)
Phosphorus: 5.2 mg/dL — ABNORMAL HIGH (ref 2.3–4.6)
Potassium: 3.2 mEq/L — ABNORMAL LOW (ref 3.5–5.1)
Sodium: 139 mEq/L (ref 135–145)

## 2011-01-31 LAB — POCT HEMOGLOBIN-HEMACUE
Hemoglobin: 12.1 g/dL (ref 12.0–15.0)
Hemoglobin: 12.6 g/dL (ref 12.0–15.0)

## 2011-01-31 LAB — IRON AND TIBC
Iron: 52 ug/dL (ref 42–135)
Saturation Ratios: 18 % — ABNORMAL LOW (ref 20–55)
UIBC: 235 ug/dL

## 2011-02-01 LAB — IRON AND TIBC
Iron: 48 ug/dL (ref 42–135)
UIBC: 253 ug/dL

## 2011-02-01 LAB — POCT HEMOGLOBIN-HEMACUE
Hemoglobin: 10.7 g/dL — ABNORMAL LOW (ref 12.0–15.0)
Hemoglobin: 12.2 g/dL (ref 12.0–15.0)

## 2011-02-02 ENCOUNTER — Other Ambulatory Visit: Payer: Self-pay | Admitting: Nephrology

## 2011-02-02 ENCOUNTER — Other Ambulatory Visit: Payer: Self-pay

## 2011-02-02 ENCOUNTER — Encounter (HOSPITAL_COMMUNITY): Payer: Medicare Other | Attending: Nephrology

## 2011-02-02 DIAGNOSIS — N184 Chronic kidney disease, stage 4 (severe): Secondary | ICD-10-CM | POA: Insufficient documentation

## 2011-02-02 DIAGNOSIS — D638 Anemia in other chronic diseases classified elsewhere: Secondary | ICD-10-CM | POA: Insufficient documentation

## 2011-02-02 LAB — RENAL FUNCTION PANEL
Albumin: 3.1 g/dL — ABNORMAL LOW (ref 3.5–5.2)
CO2: 30 mEq/L (ref 19–32)
Chloride: 100 mEq/L (ref 96–112)
Creatinine, Ser: 4.42 mg/dL — ABNORMAL HIGH (ref 0.4–1.2)
GFR calc Af Amer: 12 mL/min — ABNORMAL LOW (ref >60)
GFR calc non Af Amer: 10 mL/min — ABNORMAL LOW (ref >60)
Potassium: 3.2 mEq/L — ABNORMAL LOW (ref 3.5–5.1)
Sodium: 140 mEq/L (ref 135–145)

## 2011-02-02 LAB — POCT HEMOGLOBIN-HEMACUE
Hemoglobin: 11.3 g/dL — ABNORMAL LOW (ref 12.0–15.0)
Hemoglobin: 10.9 g/dL — ABNORMAL LOW (ref 12.0–15.0)
Hemoglobin: 9.3 g/dL — ABNORMAL LOW (ref 12.0–15.0)

## 2011-02-02 LAB — IRON AND TIBC
Iron: 50 ug/dL (ref 42–135)
UIBC: 249 ug/dL

## 2011-02-03 LAB — GLUCOSE, CAPILLARY
Glucose-Capillary: 104 mg/dL — ABNORMAL HIGH (ref 70–99)
Glucose-Capillary: 117 mg/dL — ABNORMAL HIGH (ref 70–99)
Glucose-Capillary: 145 mg/dL — ABNORMAL HIGH (ref 70–99)
Glucose-Capillary: 151 mg/dL — ABNORMAL HIGH (ref 70–99)
Glucose-Capillary: 187 mg/dL — ABNORMAL HIGH (ref 70–99)
Glucose-Capillary: 218 mg/dL — ABNORMAL HIGH (ref 70–99)

## 2011-02-03 LAB — BASIC METABOLIC PANEL
BUN: 46 mg/dL — ABNORMAL HIGH (ref 6–23)
BUN: 54 mg/dL — ABNORMAL HIGH (ref 6–23)
CO2: 28 mEq/L (ref 19–32)
CO2: 33 mEq/L — ABNORMAL HIGH (ref 19–32)
Calcium: 8.3 mg/dL — ABNORMAL LOW (ref 8.4–10.5)
Calcium: 8.4 mg/dL (ref 8.4–10.5)
Chloride: 101 mEq/L (ref 96–112)
Chloride: 106 mEq/L (ref 96–112)
Creatinine, Ser: 3.44 mg/dL — ABNORMAL HIGH (ref 0.4–1.2)
Creatinine, Ser: 4.46 mg/dL — ABNORMAL HIGH (ref 0.4–1.2)
GFR calc Af Amer: 14 mL/min — ABNORMAL LOW (ref >60)
GFR calc Af Amer: 16 mL/min — ABNORMAL LOW (ref >60)
GFR calc non Af Amer: 11 mL/min — ABNORMAL LOW (ref >60)
Potassium: 3.7 mEq/L (ref 3.5–5.1)
Potassium: 3.8 mEq/L (ref 3.5–5.1)
Sodium: 143 mEq/L (ref 135–145)

## 2011-02-03 LAB — CBC
HCT: 27.5 % — ABNORMAL LOW (ref 36.0–46.0)
HCT: 27.8 % — ABNORMAL LOW (ref 36.0–46.0)
HCT: 32 % — ABNORMAL LOW (ref 36.0–46.0)
Hemoglobin: 8.6 g/dL — ABNORMAL LOW (ref 12.0–15.0)
Hemoglobin: 8.6 g/dL — ABNORMAL LOW (ref 12.0–15.0)
MCH: 27.6 pg (ref 26.0–34.0)
MCHC: 30.9 g/dL (ref 30.0–36.0)
MCHC: 31.3 g/dL (ref 30.0–36.0)
MCV: 88.1 fL (ref 78.0–100.0)
Platelets: 197 10*3/uL (ref 150–400)
Platelets: 235 10*3/uL (ref 150–400)
RBC: 3.1 MIL/uL — ABNORMAL LOW (ref 3.87–5.11)
RBC: 3.12 MIL/uL — ABNORMAL LOW (ref 3.87–5.11)
RDW: 16.6 % — ABNORMAL HIGH (ref 11.5–15.5)
RDW: 18 % — ABNORMAL HIGH (ref 11.5–15.5)
WBC: 8.1 10*3/uL (ref 4.0–10.5)
WBC: 9.1 K/uL (ref 4.0–10.5)
WBC: 9.2 10*3/uL (ref 4.0–10.5)

## 2011-02-03 LAB — IRON AND TIBC
Saturation Ratios: 23 % (ref 20–55)
TIBC: 323 ug/dL (ref 250–470)
UIBC: 249 ug/dL

## 2011-02-03 LAB — URINALYSIS, ROUTINE W REFLEX MICROSCOPIC
Bilirubin Urine: NEGATIVE
Glucose, UA: 250 mg/dL — AB
Ketones, ur: NEGATIVE mg/dL
Nitrite: NEGATIVE
Protein, ur: >300 mg/dL — AB
Specific Gravity, Urine: 1.016 (ref 1.005–1.030)
Urobilinogen, UA: 0.2 mg/dL (ref 0.0–1.0)
pH: 5 (ref 5.0–8.0)

## 2011-02-03 LAB — DIFFERENTIAL
Basophils Absolute: 0 10*3/uL (ref 0.0–0.1)
Basophils Absolute: 0 10*3/uL (ref 0.0–0.1)
Basophils Relative: 0 % (ref 0–1)
Basophils Relative: 0 % (ref 0–1)
Eosinophils Absolute: 0.2 10*3/uL (ref 0.0–0.7)
Eosinophils Relative: 2 % (ref 0–5)
Eosinophils Relative: 3 % (ref 0–5)
Lymphocytes Relative: 21 % (ref 12–46)
Lymphs Abs: 1.9 K/uL (ref 0.7–4.0)
Monocytes Absolute: 0.7 K/uL (ref 0.1–1.0)
Monocytes Absolute: 0.8 10*3/uL (ref 0.1–1.0)
Monocytes Relative: 8 % (ref 3–12)
Neutro Abs: 5.3 10*3/uL (ref 1.7–7.7)
Neutro Abs: 6.3 K/uL (ref 1.7–7.7)
Neutrophils Relative %: 69 % (ref 43–77)

## 2011-02-03 LAB — POCT HEMOGLOBIN-HEMACUE
Hemoglobin: 7.4 g/dL — ABNORMAL LOW (ref 12.0–15.0)
Hemoglobin: 9.2 g/dL — ABNORMAL LOW (ref 12.0–15.0)
Hemoglobin: 9.4 g/dL — ABNORMAL LOW (ref 12.0–15.0)

## 2011-02-03 LAB — URINE MICROSCOPIC-ADD ON

## 2011-02-03 LAB — BASIC METABOLIC PANEL WITH GFR
GFR calc Af Amer: 12 mL/min — ABNORMAL LOW (ref >60)
GFR calc non Af Amer: 10 mL/min — ABNORMAL LOW (ref >60)
Glucose, Bld: 332 mg/dL — ABNORMAL HIGH (ref 70–99)
Potassium: 3.9 meq/L (ref 3.5–5.1)
Sodium: 136 meq/L (ref 135–145)

## 2011-02-03 LAB — BRAIN NATRIURETIC PEPTIDE
Pro B Natriuretic peptide (BNP): 1069 pg/mL — ABNORMAL HIGH (ref 0.0–100.0)
Pro B Natriuretic peptide (BNP): 575 pg/mL — ABNORMAL HIGH (ref 0.0–100.0)

## 2011-02-04 LAB — COMPREHENSIVE METABOLIC PANEL
ALT: 20 U/L (ref 0–35)
AST: 19 U/L (ref 0–37)
Alkaline Phosphatase: 48 U/L (ref 39–117)
CO2: 30 mEq/L (ref 19–32)
Chloride: 109 mEq/L (ref 96–112)
GFR calc Af Amer: 17 mL/min — ABNORMAL LOW (ref >60)
GFR calc non Af Amer: 14 mL/min — ABNORMAL LOW (ref >60)
Potassium: 3.7 mEq/L (ref 3.5–5.1)
Sodium: 144 mEq/L (ref 135–145)
Total Bilirubin: 0.5 mg/dL (ref 0.3–1.2)

## 2011-02-04 LAB — POCT I-STAT 4, (NA,K, GLUC, HGB,HCT)
Glucose, Bld: 95 mg/dL (ref 70–99)
HCT: 34 % — ABNORMAL LOW (ref 36.0–46.0)
Potassium: 3.4 mEq/L — ABNORMAL LOW (ref 3.5–5.1)

## 2011-02-04 LAB — BASIC METABOLIC PANEL
BUN: 37 mg/dL — ABNORMAL HIGH (ref 6–23)
CO2: 33 mEq/L — ABNORMAL HIGH (ref 19–32)
Chloride: 103 mEq/L (ref 96–112)
Creatinine, Ser: 3.38 mg/dL — ABNORMAL HIGH (ref 0.4–1.2)
GFR calc Af Amer: 16 mL/min — ABNORMAL LOW (ref >60)
GFR calc non Af Amer: 13 mL/min — ABNORMAL LOW (ref >60)
Glucose, Bld: 179 mg/dL — ABNORMAL HIGH (ref 70–99)
Glucose, Bld: 79 mg/dL (ref 70–99)
Potassium: 4.1 mEq/L (ref 3.5–5.1)
Sodium: 144 mEq/L (ref 135–145)

## 2011-02-04 LAB — GLUCOSE, CAPILLARY
Glucose-Capillary: 111 mg/dL — ABNORMAL HIGH (ref 70–99)
Glucose-Capillary: 153 mg/dL — ABNORMAL HIGH (ref 70–99)
Glucose-Capillary: 154 mg/dL — ABNORMAL HIGH (ref 70–99)
Glucose-Capillary: 217 mg/dL — ABNORMAL HIGH (ref 70–99)
Glucose-Capillary: 70 mg/dL (ref 70–99)
Glucose-Capillary: 70 mg/dL (ref 70–99)
Glucose-Capillary: 78 mg/dL (ref 70–99)
Glucose-Capillary: 91 mg/dL (ref 70–99)
Glucose-Capillary: 93 mg/dL (ref 70–99)

## 2011-02-04 LAB — POCT I-STAT, CHEM 8
Calcium, Ion: 1.16 mmol/L (ref 1.12–1.32)
Creatinine, Ser: 3.1 mg/dL — ABNORMAL HIGH (ref 0.4–1.2)
Creatinine, Ser: 3.6 mg/dL — ABNORMAL HIGH (ref 0.4–1.2)
Glucose, Bld: 61 mg/dL — ABNORMAL LOW (ref 70–99)
Hemoglobin: 10.2 g/dL — ABNORMAL LOW (ref 12.0–15.0)
Hemoglobin: 10.5 g/dL — ABNORMAL LOW (ref 12.0–15.0)
Potassium: 3.8 mEq/L (ref 3.5–5.1)
Potassium: 4 mEq/L (ref 3.5–5.1)
Sodium: 142 mEq/L (ref 135–145)
TCO2: 28 mmol/L (ref 0–100)
TCO2: 29 mmol/L (ref 0–100)

## 2011-02-04 LAB — CK TOTAL AND CKMB (NOT AT ARMC)
CK, MB: 2.8 ng/mL (ref 0.3–4.0)
Relative Index: 1.7 (ref 0.0–2.5)
Total CK: 162 U/L (ref 7–177)

## 2011-02-04 LAB — DIFFERENTIAL
Basophils Absolute: 0 10*3/uL (ref 0.0–0.1)
Basophils Relative: 0 % (ref 0–1)
Eosinophils Absolute: 0.2 10*3/uL (ref 0.0–0.7)
Eosinophils Relative: 2 % (ref 0–5)
Monocytes Absolute: 0.9 10*3/uL (ref 0.1–1.0)
Monocytes Relative: 10 % (ref 3–12)

## 2011-02-04 LAB — CBC
HCT: 30.2 % — ABNORMAL LOW (ref 36.0–46.0)
Hemoglobin: 9.6 g/dL — ABNORMAL LOW (ref 12.0–15.0)
MCH: 27.7 pg (ref 26.0–34.0)
MCHC: 31.8 g/dL (ref 30.0–36.0)
MCV: 87.3 fL (ref 78.0–100.0)
RDW: 16.4 % — ABNORMAL HIGH (ref 11.5–15.5)

## 2011-02-04 LAB — POCT CARDIAC MARKERS: CKMB, poc: 1.5 ng/mL (ref 1.0–8.0)

## 2011-02-04 LAB — CARDIAC PANEL(CRET KIN+CKTOT+MB+TROPI)
CK, MB: 3.2 ng/mL (ref 0.3–4.0)
Relative Index: 1.8 (ref 0.0–2.5)
Troponin I: 0.06 ng/mL (ref 0.00–0.06)
Troponin I: 0.07 ng/mL — ABNORMAL HIGH (ref 0.00–0.06)

## 2011-02-04 LAB — SURGICAL PCR SCREEN
MRSA, PCR: NEGATIVE
Staphylococcus aureus: NEGATIVE

## 2011-02-08 ENCOUNTER — Ambulatory Visit
Admission: RE | Admit: 2011-02-08 | Discharge: 2011-02-08 | Disposition: A | Discharge status: Home / Self Care | Payer: Medicare Other | Source: Ambulatory Visit | Attending: Internal Medicine | Admitting: Internal Medicine

## 2011-02-08 DIAGNOSIS — Z1231 Encounter for screening mammogram for malignant neoplasm of breast: Secondary | ICD-10-CM

## 2011-02-16 ENCOUNTER — Encounter (HOSPITAL_COMMUNITY)
Admission: RE | Admit: 2011-02-16 | Discharge: 2011-02-16 | Payer: Medicare Other | Source: Ambulatory Visit | Attending: Nephrology | Admitting: Nephrology

## 2011-02-16 ENCOUNTER — Encounter (HOSPITAL_COMMUNITY): Payer: 59

## 2011-02-16 ENCOUNTER — Other Ambulatory Visit: Payer: Self-pay | Admitting: Nephrology

## 2011-02-16 LAB — POCT HEMOGLOBIN-HEMACUE: Hemoglobin: 11.3 g/dL — ABNORMAL LOW (ref 12.0–15.0)

## 2011-02-25 ENCOUNTER — Other Ambulatory Visit: Payer: Self-pay | Admitting: Cardiology

## 2011-03-02 ENCOUNTER — Encounter (HOSPITAL_COMMUNITY): Payer: Medicare Other | Attending: Nephrology

## 2011-03-02 ENCOUNTER — Other Ambulatory Visit: Payer: Self-pay | Admitting: Nephrology

## 2011-03-02 DIAGNOSIS — D638 Anemia in other chronic diseases classified elsewhere: Secondary | ICD-10-CM | POA: Insufficient documentation

## 2011-03-02 DIAGNOSIS — N184 Chronic kidney disease, stage 4 (severe): Secondary | ICD-10-CM | POA: Insufficient documentation

## 2011-03-02 LAB — POCT HEMOGLOBIN-HEMACUE: Hemoglobin: 12.5 g/dL (ref 12.0–15.0)

## 2011-03-02 LAB — RENAL FUNCTION PANEL
Albumin: 3.4 g/dL — ABNORMAL LOW (ref 3.5–5.2)
BUN: 95 mg/dL — ABNORMAL HIGH (ref 6–23)
Chloride: 96 mEq/L (ref 96–112)
Creatinine, Ser: 5.01 mg/dL — ABNORMAL HIGH (ref 0.4–1.2)

## 2011-03-02 LAB — IRON AND TIBC: Saturation Ratios: 18 % — ABNORMAL LOW (ref 20–55)

## 2011-03-16 ENCOUNTER — Encounter (HOSPITAL_COMMUNITY): Payer: Medicare Other

## 2011-03-16 ENCOUNTER — Other Ambulatory Visit: Payer: Self-pay | Admitting: Nephrology

## 2011-03-16 LAB — POCT HEMOGLOBIN-HEMACUE: Hemoglobin: 11.4 g/dL — ABNORMAL LOW (ref 12.0–15.0)

## 2011-03-18 ENCOUNTER — Encounter: Payer: Self-pay | Admitting: Emergency Medicine

## 2011-03-22 ENCOUNTER — Encounter: Payer: Self-pay | Admitting: Emergency Medicine

## 2011-03-22 ENCOUNTER — Ambulatory Visit (INDEPENDENT_AMBULATORY_CARE_PROVIDER_SITE_OTHER): Payer: Medicare Other | Admitting: Emergency Medicine

## 2011-03-22 VITALS — BP 120/56 | HR 62 | Temp 98.4°F | Ht 65.5 in | Wt 222.8 lb

## 2011-03-22 DIAGNOSIS — R05 Cough: Secondary | ICD-10-CM

## 2011-03-22 MED ORDER — FLUTICASONE PROPIONATE 50 MCG/ACT NA SUSP: 2.0000 | Freq: Two times a day (BID) | NASAL | Status: DC

## 2011-03-22 NOTE — Progress Notes (Signed)
  Subjective:    Patient ID: [name], female    DOB: [birth year], 65 y.o.   MRN: [id number]  HPI 69 yobf quit smoking after hospitalization [facility] 2009 never better since then with essentially nl pft's documented 05/26/09.   June 09, 2010 ov  c/o worsening cough over the past few months.  Cough mainly dry- occ produces minimal clear to yellow sputum.  Pt states that she coughs "all day and night"- gets SOB when has coughing spells- esp at night w/in 10 min of lying down but minimal actual sputum production and no premature awakening or am exacerbation.  Pt denies any significant sore throat, dysphagia, itching, sneezing,  nasal congestion or excess secretions,  fever, chills, sweats, unintended wt loss, pleuritic or exertional cp, hempoptysis, change in activity tolerance  orthopnea pnd or leg swelling   ROV 07/22/10 -- 65 yo woman. Hx mild COPD, CHF, chronic cough. Presented last time with refractory cough - Dr Sherene Sires held Spiriva, fexofenadine, atenolol. New b-b;locker per cards is coreg. Drainage much improved with chlorphenerimine. Cough is much improved. She doesn't miss the Spiriva. She was started on supplimental O2 during hosp admission last week.   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 Has been taking OTC decongestants without much relief of congestion, sore throat, cough. Started taking loratadine last week. Has been doing nasal saline washes prn.    Review of Systems As per HPI. Also with dyspnea with exertion, walking. No CP. Occas wheeze. Strong voice.    Objective:   Physical Exam Gen: Pleasant, well-nourished, in no distress,  normal affect  ENT: No lesions,  mouth clear,  oropharynx clear mild nasal congestion  Neck: No JVD, no TMG, no carotid bruits  Lungs: No use of accessory muscles, no dullness to percussion, clear without rales or rhonchi  Cardiovascular: RRR, 3/6 syst M with intact S2, no peripheral edema  Musculoskeletal: No  deformities, no cyanosis or clubbing  Neuro: alert, non focal  Skin: Warm, no lesions or rashes           Assessment & Plan:

## 2011-03-22 NOTE — Assessment & Plan Note (Signed)
With flare of allergies.  - contin loratradine - add fluticasone - start [place] daily - rov 6 mo

## 2011-03-25 ENCOUNTER — Other Ambulatory Visit (HOSPITAL_COMMUNITY): Payer: Self-pay | Admitting: Internal Medicine

## 2011-03-25 DIAGNOSIS — R1011 Right upper quadrant pain: Secondary | ICD-10-CM

## 2011-03-29 ENCOUNTER — Ambulatory Visit (HOSPITAL_COMMUNITY)
Admission: RE | Admit: 2011-03-29 | Discharge: 2011-03-29 | Disposition: A | Discharge status: Home / Self Care | Payer: Medicare Other | Source: Ambulatory Visit | Attending: Internal Medicine | Admitting: Internal Medicine

## 2011-03-29 DIAGNOSIS — R142 Eructation: Secondary | ICD-10-CM | POA: Insufficient documentation

## 2011-03-29 DIAGNOSIS — R109 Unspecified abdominal pain: Secondary | ICD-10-CM | POA: Insufficient documentation

## 2011-03-29 DIAGNOSIS — R1011 Right upper quadrant pain: Secondary | ICD-10-CM

## 2011-03-29 DIAGNOSIS — R141 Gas pain: Secondary | ICD-10-CM | POA: Insufficient documentation

## 2011-03-29 DIAGNOSIS — R635 Abnormal weight gain: Secondary | ICD-10-CM | POA: Insufficient documentation

## 2011-03-31 ENCOUNTER — Encounter (HOSPITAL_COMMUNITY): Payer: Medicare Other

## 2011-04-01 ENCOUNTER — Other Ambulatory Visit: Payer: Self-pay | Admitting: Nephrology

## 2011-04-01 ENCOUNTER — Encounter (HOSPITAL_COMMUNITY): Payer: Medicare Other | Attending: Nephrology

## 2011-04-01 DIAGNOSIS — D638 Anemia in other chronic diseases classified elsewhere: Secondary | ICD-10-CM | POA: Insufficient documentation

## 2011-04-01 DIAGNOSIS — N184 Chronic kidney disease, stage 4 (severe): Secondary | ICD-10-CM | POA: Insufficient documentation

## 2011-04-01 LAB — IRON AND TIBC
Iron: 61 ug/dL (ref 42–135)
UIBC: 226 ug/dL

## 2011-04-01 LAB — RENAL FUNCTION PANEL
Albumin: 3.1 g/dL — ABNORMAL LOW (ref 3.5–5.2)
Calcium: 9.5 mg/dL (ref 8.4–10.5)
Creatinine, Ser: 4.35 mg/dL — ABNORMAL HIGH (ref 0.4–1.2)
GFR calc Af Amer: 12 mL/min — ABNORMAL LOW (ref >60)
GFR calc non Af Amer: 10 mL/min — ABNORMAL LOW (ref >60)
Phosphorus: 5.7 mg/dL — ABNORMAL HIGH (ref 2.3–4.6)
Sodium: 139 mEq/L (ref 135–145)

## 2011-04-01 LAB — POCT HEMOGLOBIN-HEMACUE: Hemoglobin: 11.5 g/dL — ABNORMAL LOW (ref 12.0–15.0)

## 2011-04-05 NOTE — Assessment & Plan Note (Signed)
OFFICE VISIT   [name], [name]  DOB:  1946-11-10                                       08/18/2010  [id number]#:[id number]   I saw the patient in the office today for followup after her recent AV  fistula was placed.  She had a left upper arm brachial cephalic fistula  placed on 07/06/2010.  In reviewing her operative report it appears that  she had a reasonable sized upper arm cephalic vein.   She has no specific complaints.  She has had no arm pain and has been  doing quite well.  She is not on dialysis at this point.   On physical examination this is a pleasant 65 year old woman who appears  her stated age.  Blood pressure is 146/71 in the right arm, heart rate  is 75, saturation 94% on 2 liters.  She has a palpable radial pulse on  the left.  Her incision in the left arm has healed nicely and she has an  excellent thrill in her fistula.  Overall I am pleased with her progress  and hopefully this will provide adequate access if and when she needs  it.  We will see her back p.r.n.     Di Kindle. Edilia Bo, M.D.  Electronically Signed   CSD/MEDQ  D:  08/18/2010  T:  08/19/2010  Job:  [id number]   cc:   [facility]  Garnetta Buddy, M.D.

## 2011-04-05 NOTE — Assessment & Plan Note (Signed)
[facility]                            CARDIOLOGY OFFICE NOTE   NAME:[name], [name]                      MRN:          [id number]  DATE:10/10/2008                            DOB:          [birth year]    REASON FOR PRESENTATION:  Evaluate the patient with heart failure.   HISTORY OF PRESENT ILLNESS:  The patient is a lovely 65 year old African-  American female, who has had a history of hypertension.  She actually  had a stress perfusion study here for some chest discomfort in 2002.  This demonstrated no evidence of ischemia or infarct.  She was recently  hospitalized on October 01, 2008, with shortness of breath.  This had  actually been coming on somewhat slowly.  She was describing dyspnea  with mild exertion.  When she came into the hospital, her chest x-ray  did demonstrate some edema.  Her BNP was elevated at 178.  She was  treated with an increased dose of diuretic and sent home on Lasix twice  a day rather than once a day.  She was hospitalized only for a couple of  days.  She did have an echo, which demonstrated that the ejection  fraction was about 55%.  The left ventricle was slightly thickened.  There were no wall motion abnormalities.  There were no significant  valvular abnormalities.   Since being discharged from the hospital, she has been close to her  baseline.  She states that she thinks she has had a decline in her  functional status over the last year, though she is able now to do  moderate activities without any dyspnea.  She does not have any resting  shortness of breath.  She has no neck or arm discomfort.  She denies any  palpitations, presyncope, or syncope.  She will occasionally get some  fleeting chest discomfort.  She will also get some rare chest  fluttering.   PAST MEDICAL HISTORY:  1. Diabetes mellitus type 2.  2. Gastroesophageal reflux disease.  3. Chronic renal insufficiency.  4. Degenerative joint disease.  5. Depression.  6. Hypertension.   ALLERGIES:  CODEINE.   MEDICATIONS:  1. Atenolol 100 mg daily.  2. Aspirin 81 mg daily.  3. Cardizem 360 mg daily.  4. Clonidine 0.2 mg t.i.d.  5. Clorazepate 7.5 mg t.i.d. p.r.n.  6. Lantus.  7. Lasix 40 mg b.i.d.  8. Lyrica 50 mg b.i.d.  9. Micardis 80 mg daily.  10.Nexium 40 mg daily.  11.Vytorin 10/80 mg daily.  12.Vitamin D.  13.Humalog sliding scale.  14.Potassium 20 mEq daily.  15.Allegra 180 mg daily.  16.Darvocet.  17.Menopause complex vitamin.   SOCIAL HISTORY:  The patient is retired.  She is married.  She has 2  children.  Her daughter is a Scientific laboratory technician, who is in the room with her.  She does not smoke cigarettes or drink alcohol.   FAMILY HISTORY:  Contributory for her mother dying apparently of  myocardial infarction in her 100s and her father dying with heart failure  in his 17s.   REVIEW OF SYSTEMS:  As stated  in the HPI and positive for occasional  headaches, apparent ulcer in the past, leg cramping, mild swelling,  joint pains, low back pain, nightmares recently, difficulty sleeping,  rhinitis, and varicose veins.  Otherwise, negative for all other  systems.   PHYSICAL EXAMINATION:  GENERAL:  The patient is pleasant and in no  distress.  VITAL SIGNS:  Blood pressure 152/72, heart rate 49 and regular, weight  200 pounds, and body mass index 35.  HEENT:  Eyelids, unremarkable.  Pupils equal, round, and reactive to  light.  Fundi within normal limits.  Oral mucosa, unremarkable.  NECK:  No jugular venous distention at 45 degrees.  Carotid upstroke  brisk and symmetrical.  No bruits.  No thyromegaly.  LYMPHATICS:  No cervical, axillary, or inguinal adenopathy.  LUNGS:  Clear to auscultation bilaterally.  BACK:  No costovertebral tenderness.  CHEST:  Unremarkable.  HEART:  PMI not displaced, just sustained.  S1 and S2 within normal  limits.  No S3.  No S4.  No clicks, no rubs, and no murmurs.  ABDOMEN:  Mildly obese,  positive bowel sounds, normal in frequency and  pitch.  No bruits.  No rebound, guarding, or midline pulsatile mass.  No  hepatomegaly or splenomegaly.  SKIN:  No rashes.  No nodules.  EXTREMITIES:  Pulse 2+ throughout.  No edema, cyanosis, or clubbing.  NEUROLOGIC:  Oriented to place, person, and time.  Cranial nerves II  through XII are grossly intact.  Motor grossly intact throughout.   EKG; sinus bradycardia, rate 49, axis within normal limits, intervals  within normal limits, no acute ST-T wave changes.   ASSESSMENT AND PLAN:  1. Heart failure.  The patient has heart failure, but with preserved      ejection fraction.  I think there is a possibility that this could      be ischemic.  She has multiple cardiovascular risk factors.  She      has not had a stress perfusion test in 7 years.  She does not think      she will be able to walk on a treadmill.  Therefore, she will need      an adenosine Cardiolite to rule out ischemia.  If this is normal,      then the most likely cause is her hypertension.  This is addressed      below.  We did discuss salt and fluid restriction and the      management of diastolic heart failure.  2. Hypertension.  Blood pressure is slightly elevated.  She has      excellent followup by Dr. Oneta Rack and Dr. Hyman Hopes.  I would not adjust      her meds, but rather reinforced 4 g sodium diet, which her daughter      can oversee.  I also suggested a 10-pound weight loss, which might      bring her to where we would want her to be, at least 140/90.  3. Weight, as above.  4. Diabetes, per Dr. Oneta Rack.  5. Dyslipidemia, per Dr. Oneta Rack.  I would suggest a goal LDL less      than 100 and HDL greater than 50.  6. Followup.  I would like to see her back in about 4 months, but most      likely, we will defer her management to Dr. Oneta Rack and Dr. Hyman Hopes      going forward, if she remains fairly asymptomatic.     Rollene Rotunda, MD, [facility]  Electronically Signed     JH/MedQ  DD: 10/10/2008  DT: 10/11/2008  Job #: [id number]   cc:   Lucky Cowboy, M.D.

## 2011-04-05 NOTE — Discharge Summary (Signed)
[name], [name]               ACCOUNT NO.:  [id number]   MEDICAL RECORD NO.:  [id number]          PATIENT TYPE:  INP   LOCATION:  4729                         FACILITY:  [facility]   PHYSICIAN:  Lucita Ferrara, MD         DATE OF BIRTH:  October 01, 1946   DATE OF ADMISSION:  09/21/2008  DATE OF DISCHARGE:  09/23/2008                               DISCHARGE SUMMARY   DISCHARGE DIAGNOSES:  1. Shortness of breath.  2. Mild congestive heart failure exacerbation.  3. Uncontrolled hypertension.  4. Diabetes type 2.   PROCEDURES:  The patient had a chest x-ray dated September 21, 2008 which  showed chronic congestive heart failure with pleural effusions.  The  patient had repeat chest x-ray September 23, 2008 which showed improvement  in her CHF.   BRIEF HISTORY OF PRESENT ILLNESS:  The patient is a 65 year old African  American female who was brought to the emergency department with her  daughter with shortness of breath 24 hours prior to admission.  She  denied any fevers, chills, cough or sputum.  She was admitted with  presumptive diagnosis of congestive heart failure.  Her EKG remained  stable.  Her beta nitrite peptide was initially elevated and her chest x-  ray confirmed CHF.  Her cardiac enzymes x3 every 8 hours remain  negative.  She was started on IV diuresis and titrated to p.o.  There  was strict Is and Os, and the patient was instructed on CHF.  A 2-D  echocardiogram was ordered and results are pending.  Discharge  medications include the following:  1. Atenolol 100 mg p.o. nightly.  2. Cardizem LA 360 mg daily.  3. Clonidine 0.2 mg p.o. t.i.d.  4. Lantus 58 units subcu daily.  5. Lasix 40 mg 2 times a day.  6. Micardis 80 mg daily.  7. Nexium 40 mg daily.  8. Vytorin 10/80 p.o. daily.  9. Vitamin D 1000 units 2 tablets p.o. daily.  10.Potassium chloride 20 mg daily.  11.Allegra 180 mg daily.  12.Sliding scale insulin with Humulin.  Instructions given regarding      sliding  scale insulin to the patient.   The patient also advised Lasix 40 mg p.o. twice daily.  She is  instructed to increase Lasix to 40 mg orally 3 times a day for the next  3 days prior to going back to regular scheduled dose twice daily.  Note,  I will re-dictate the 2-D echocardiogram results.   DISCHARGE PHYSICAL EXAMINATION AND LABORATORY DATA:  GENERAL:  The  patient is in no acute distress.  She is able to ambulate without any  difficulty.  HEENT: Normocephalic, atraumatic.  Sclerae is anicteric.  NECK:  Supple.  No JVD or carotid bruits.  Extraocular muscles intact.  CARDIOVASCULAR:  S1, S2.  Regular rhythm with no murmurs, rubs, or  clicks.  LUNGS:  Clear to auscultation bilaterally.  Without wheezes.  ABDOMEN:  Soft, nontender, nondistended, positive bowel sounds.  EXTREMITIES:  No clubbing, cyanosis or edema.   Beta nitrite peptide is 144.  The patient's activities, she is  advised  to increase activity slowly.  She is advised to call with any chest  pain, shortness of breath.      Lucita Ferrara, MD  Electronically Signed     RR/MEDQ  D:  09/23/2008  T:  09/23/2008  Job:  [id number]

## 2011-04-05 NOTE — Assessment & Plan Note (Signed)
OFFICE VISIT   LORIEANN, ARGUETA  DOB:  December 12, 1945                                       09/01/2010  ZOXWR#:[id number]   I saw patient in the office today for follow-up after her AV fistula was  placed on 07/06/2010.  I had seen her on 08/18/10, and she had no  specific complaints, but apparently her daughter was concerned because  she did not mention that she had been having some cramping in her left  hand.  She was brought back for another assessment.   She states that when she is using her left hand, she experiences some  paresthesias, and sometimes the hand cramps.  She has no real rest pain.  The symptoms are improved with dependency and aggravated by elevation.   On examination, her incision is healed nicely.  She has an excellent  thrill in her upper arm fistula.  She has a palpable radial pulse on the  left.  She has a brisk radial, ulnar, and palmar arch signal with the  Doppler.  It does augment some with compression of her fistula.   I think she does have a mild steal.  She is currently not on dialysis.  I think with gradual exercise that her symptoms should gradually  improve.  If they do, not consideration could be given to arteriography  and the drill procedure to try to salvage her fistula.  However,  currently her symptoms seem quite tolerable and are fairly mild.  I have  offered to see her back in 3 weeks.  However, she states that she will  simply call us if her symptoms do not improve.     Di Kindle. Edilia Bo, M.D.  Electronically Signed   CSD/MEDQ  D:  09/01/2010  T:  09/02/2010  Job:  [id number]   cc:   Garnetta Buddy, M.D.

## 2011-04-05 NOTE — H&P (Signed)
[name], [name]               ACCOUNT NO.:  [id number]   MEDICAL RECORD NO.:  [id number]          PATIENT TYPE:  INP   LOCATION:  4729                         FACILITY:  [facility]   PHYSICIAN:  Della Goo, M.D. DATE OF BIRTH:  [birth year]   DATE OF ADMISSION:  09/21/2008  DATE OF DISCHARGE:                              HISTORY & PHYSICAL   PRIMARY CARE PHYSICIAN:  Dr. Lucky Cowboy.   CHIEF COMPLAINT:  Shortness of breath.   HISTORY OF PRESENT ILLNESS:  This is a 65 year old female who was  brought to the emergency department by her daughter secondary to  worsening shortness of breath, worse the past 24 hours.  The patient has  had shortness of breath and orthopnea for 2 weeks.  She denies having  any fevers, chills but does report having cough of watery sputum.  She  denies having any fevers, chills or rhinitis symptoms.  The patient also  reports having increased swelling of both of her lower legs.   When the patient arrived in the emergency department, on the initial  evaluation her O2 saturation was found to be 66% on room air and patient  was tachypneic.  The patient was placed on 100% oxygen, then BiPAP and  was further evaluated..  The patient had x-ray studies performed which  revealed CHF and was administered 80 mg of IV Lasix once along with IV  enalapril and sublingual nitroglycerin, and began to have improvement  and her oxygen was titrated down to nasal cannula oxygen.  The patient  was referred for medical admission.   PAST MEDICAL HISTORY:  1. Hypertension.  2. Type 2 diabetes mellitus.  3. Hyperlipidemia.  4. Gastroesophageal reflux disease.  5. Chronic renal insufficiency.  6. Obesity.   MEDICATIONS:  1. Clonidine 0.2 mg p.o. t.i.d.  2. Atenolol 100 mg p.o. nightly.  3. Allegra 180 mg one p.o. daily.  4. Potassium chloride 20 mEq one p.o. daily.  5,  Vytorin 10/80 one p.o. daily.  1. Benicar 40 mg one p.o. daily.  2. Lantus insulin 58 units  subcutaneous nightly.  3. Glucophage 1000 mg p.o. b.i.d.  4. Nexium 40 mg p.o. daily.  5. Cardizem 360 mg p.o. daily.  6. Multivitamin 1 p.o. daily.  7. Vitamin D3 2000 units p.o. daily.  8. Aspirin 81 mg one p.o. daily.  9. Lasix 40 mg one p.o. b.i.d.   ALLERGIES:  CODEINE.   SOCIAL HISTORY:  Patient is a smoker, smokes one pack a day for 30+  years.  She is a nondrinker.   FAMILY HISTORY:  Positive for diabetes and hypertension.   REVIEW OF SYSTEMS:  Pertinents are mentioned above.   PHYSICAL EXAMINATION FINDINGS:  This is a 65 year old obese, well-  developed female who was an acute distress and is currently stabilized  and breathing comfortably on 5 liters nasal cannula oxygen at this time.  HEENT:  Normocephalic/atraumatic, pupils equally round and reactive to  light, extraocular movements are intact, funduscopic benign, there is no  scleral icterus, oropharynx is clear.  NECK:  Supple, full range of motion, no thyromegaly, adenopathy, jugular  venous distention.  CARDIOVASCULAR:  Regular rate and rhythm, no murmurs, gallops or rubs.  LUNGS:  Clear to auscultation bilaterally.  ABDOMEN:  Positive bowel sounds, soft, nontender, nondistended.  EXTREMITIES:  With 2+ edema.  NEUROLOGIC:  Nonfocal.   LABORATORY STUDIES:  White blood cell count 11.4, hemoglobin 12.8,  hematocrit 38.6, platelets 254, MCV 92.7, ProTime 12.1, INR 0.9, PTT 31,  sodium 140, potassium 3.6, chloride 108, carbon dioxide 24, BUN 18,  creatinine 1.47 and glucose 74, magnesium 2.4.  Cardiac enzymes with a  total CK of 188, CK-MB 3.2, relative index 1.7.  Beta natriuretic  peptide 174.  Chest x-ray reveals congestive heart failure findings.   ASSESSMENT:  A 65 year old female being admitted with:  1. Respiratory distress.  2. Acute congestive heart failure syndrome.  3. Uncontrolled hypertension.  4. Type 2 diabetes mellitus.   PLAN:  The patient will be admitted to an area for monitoring.  The   patient will continue on IV diuretic therapy for the next 24 hours to  promote diuresis.  Potassium supplementation will also be ordered.  Patient will be placed on nitro paste therapy.  She will continue on her  regular medications which do include ARB therapy.  DVT and GI  prophylaxis have also been ordered as well along with sliding scale  insulin coverage as needed for elevated blood sugars.  A 2D echo study  has been ordered to evaluate patient's left ventricular function and  further workup will ensue pending results of her studies as well as her  clinical course.      Della Goo, M.D.  Electronically Signed     HJ/MEDQ  D:  09/22/2008  T:  09/22/2008  Job:  [id number]   cc:   Lucky Cowboy, M.D.

## 2011-04-05 NOTE — Procedures (Signed)
CEPHALIC VEIN MAPPING   INDICATION:  Preop for AV fistula placement.   HISTORY:  Chronic renal insufficiency.   EXAM:  The right cephalic vein is compressible with diameter measurements  ranging from 0.24 to 0.54 cm.   The right basilic vein is compressible with diameter measurements  ranging from 0.27 to 0.44 cm.   The left cephalic vein is compressible with diameter measurements  ranging from 0.25 to 0.66 cm.   The left basilic vein is compressible with diameter measurements ranging  from 0.33 to 0.56 cm.   See attached worksheet for all measurements.   IMPRESSION:  Patent bilateral cephalic and basilic veins with diameter  measurements as described above and on the attached worksheet.   ___________________________________________  Di Kindle. Edilia Bo, M.D.   CH/MEDQ  D:  06/24/2010  T:  06/24/2010  Job:  [id number]

## 2011-04-05 NOTE — Consult Note (Signed)
NEW PATIENT CONSULTATION   [name], [name]  DOB:  [birth year]                                       06/24/2010  YNWGN#:[id number]   I saw the patient in the office today in consultation for hemodialysis  access.  She was referred by Dr. Hyman Hopes.  This is a pleasant 65 year old  right-handed woman with chronic renal insufficiency.  This is believed  secondary to her diabetes.  She has essentially been asymptomatic except  for some dyspnea on exertion and also some mild lower extremity edema.  She has had no fatigue, anorexia, nausea, vomiting or palpitation. Her  PMH is significant  for insulin-dependent diabetes, hypertension,  hypercholesterolemia, and history of congestive heart failure.  She  denies any history of previous myocardial infarction and has had no  history of COPD.   SOCIAL HISTORY:  She is married.  She has 2 children.  She quit tobacco  in 2009.   FAMILY HISTORY:  Her mother had heart failure, as did her father.  She  is unaware of any history of premature cardiovascular disease.   REVIEW OF SYSTEMS:  GENERAL:  She has had no recent weight loss, weight  gain or problems with her appetite.  She is 215 pounds.  CARDIOVASCULAR:  She has had no chest pain or chest pressure.  She does  admit to some or orthopnea and also dyspnea on exertion.  She has had no  palpitations or arrhythmias.  She has had no history of stroke, TIAs or  amaurosis fugax.  She has had no history of DVT.  GI:  She has had reflux and occasional constipation.  PULMONARY:  She had a productive cough in the past.  GU:  She has had no dysuria or frequency.  PSYCHIATRIC:  She had depression in the past.  Neurologic, hematologic, ENT, musculoskeletal, integumentary review of  systems is unremarkable and is documented on the medical history form in  her chart.   PHYSICAL EXAMINATION:  This is a pleasant 65 year old woman who appears  her stated age.  Blood pressure is 156/75, heart  rate is 62, respiratory  rate is 18.  HEENT:  Unremarkable.  Lungs are clear bilaterally to  auscultation without rales, rhonchi or wheezing.  On cardiovascular  exam, I do not detect any carotid bruits.  She has a regular rate and  rhythm.  She has palpable femoral pulses and palpable pedal pulses  bilaterally.  She has mild bilateral lower extremity swelling.  Her  abdomen is soft and nontender with normal-pitched bowel sounds.  Musculoskeletal exam:  There are major deformities or cyanosis.  Neurologic:  She has no focal weakness or paresthesias.  Skin:  There  are no ulcers or rashes.   I have ordered and independently interpreted her vein mapping, which  shows a marginal-sized forearm cephalic vein on the left with a much  more reasonable-sized upper arm cephalic vein on the left.  The basilic  vein looks potentially usable on the left also.  On the right side she  has similar measurements with a marginal forearm cephalic vein and more  reasonable-sized upper arm cephalic vein.   I have also reviewed her lab studies, which show in July her white count  was 8.3, platelets 197,000.  Creatinine is 3.49.   IMPRESSION:  Based on her vein mapping, I think she is  a good candidate  for an upper arm fistula on the left.  If this were not found to be  adequate, she could potentially have a basilic transposition.  If  neither were adequate, she would require an AV graft.  I have discussed  the indications for surgery and the potential complications including  but not limited to failure of the fistula to mature, graft thrombosis,  graft infection, steal syndrome, wound healing problems, arm swelling  and bleeding.  All of her questions were answered and she is agreeable  to proceed.  Her surgery has been scheduled for August 16.     Di Kindle. Edilia Bo, M.D.  Electronically Signed   CSD/MEDQ  D:  06/20/2010  T:  06/25/2010  Job:  [id number]   cc:   Garnetta Buddy, M.D.

## 2011-04-05 NOTE — Discharge Summary (Signed)
NAME:  [name], [name]               ACCOUNT NO.:  [id number]   MEDICAL RECORD NO.:  [id number]           PATIENT TYPE:   LOCATION:                                 FACILITY:   PHYSICIAN:  Monte Fantasia, MD       DATE OF BIRTH:   DATE OF ADMISSION:  DATE OF DISCHARGE:                               DISCHARGE SUMMARY   ADDENDUM   DISCHARGE DIAGNOSES:  1. Mild congestive heart failure exacerbation.  2. Hypertension.  3. Diabetes type 2.   PROCEDURE:  The patient had a 2-D echo which was done on September 23, 2008.  It shows overall left ventricular systolic function normal, left  ventricular ejection fraction 55-65%.  No diagnostic left ventricular  regional wall abnormality, however, cannot be completely excluded on the  basis of the study.  Left ventricular wall thickness was upper limits of  the normal.  There is increase in the relative contribution of atrial  contraction to the left ventricular filling.  Left atrium is mildly  dilated.   MEDICATIONS ON DISCHARGE:  1. Atenolol 100 mg p.o. nightly.  2. Cardizem LA 360 p.o. daily.  3. Clonidine 0.2 mg p.o. t.i.d.  4. Lantus 58 units subcutaneous daily.  5. Lasix 40 mg 2 times a day.  She is also instructed to increase      Lasix to 40 mg 3 times a day to her regular scheduled dose prior to      going back to her regular scheduling dose.  6. Micardis 80 mg p.o. daily.  7. Nexium 40 mg daily.  8. Vytorin 10/80 daily.  9. Vitamin D 1000 units 2 tabs p.o. daily.  10.Potassium chloride 20 mEq daily.  11.Allegra 180 mg daily.  12.Sliding scale insulin given to the patient.   PHYSICAL EXAMINATION:  GENERAL:  The patient is comfortable, not in  acute distress, is able to ambulate with no difficulty, and no shortness  of breath.  HEENT:  Pupils equal and reacting to light.  No pallor.  NECK:  Supple.  No JVD.  No lymphadenopathy.  RESPIRATORY:  Air entry is bilaterally equal.  No rales and no rhonchi.  ABDOMEN:  Soft.  No  guarding and no rigidity.  Nontender.  CARDIOVASCULAR:  S1 and S2 normal, regular rate and rhythm with no  murmurs or rubs.  EXTREMITIES:  No edema of feet.   LABORATORY DATA:  No new labs today.  WBC 6.6, hemoglobin 11.9,  hematocrit is 35, and platelets 226.  Sodium 143, potassium 3.7,  chloride 105, bicarb is 32, glucose 123, BUN 15, creatinine 1.6,  bilirubin is 0.5, alkaline phosphatase 47, AST 15, ALT 15, total protein  is 5.8, albumin is 2.6, and calcium is 8.8.  BNP is 144 and TSH is  2.317.   ASSESSMENT AND PLAN:  We will discharge the patient on the discharge  medications dictated as above.  The patient to have a scheduled visit  with her primary care physician as an outpatient.      Monte Fantasia, MD  Electronically Signed     MP/MEDQ  D:  09/24/2008  T:  09/25/2008  Job:  [id number]

## 2011-04-08 NOTE — Discharge Summary (Signed)
NAMEYANIA, BOGIE               ACCOUNT NO.:  [id number]   MEDICAL RECORD NO.:  [id number]          PATIENT TYPE:  INP   LOCATION:  4729                         FACILITY:  [facility]   PHYSICIAN:  Lucita Ferrara, MD         DATE OF BIRTH:  December 09, 1945   DATE OF ADMISSION:  09/21/2008  DATE OF DISCHARGE:  11/04/2009LH                               DISCHARGE SUMMARY   ADDENDUM:   CORRECTIONS ON MEDICATION LIST:  Supposedly, the medication  reconciliation sheet second page was missing.  Medications that were  missing are the following:  1. Menopause complex vitamin once daily.  2. Metformin 500 mg p.o. once daily.      Lucita Ferrara, MD  Electronically Signed     RR/MEDQ  D:  11/26/2008  T:  11/26/2008  Job:  [id number]

## 2011-04-08 NOTE — Assessment & Plan Note (Signed)
[facility]                         GASTROENTEROLOGY OFFICE NOTE   NAME:[name], [name] RUDDELL                      MRN:          [id number]  DATE:12/13/2006                            DOB:          Jan 13, 1946    REASON FOR CONSULTATION:  History of colon polyps.   [name] is a pleasant 65 year old African-American female referred  through the courtesy of Dr. Oneta Rack for evaluation.  In 2005,  colonoscopy demonstrated multiple adenomatous colon polyps.  The test  was performed because of history of polyps.  [name] does complain  of constipation.  This has been a worsening problem over the last couple  of years.  With constipation she may have lower abdominal pain.  There  is no history of melena or hematochezia.   PAST MEDICAL HISTORY:  Pertinent for hypertension, insulin-dependent  diabetes, arthritis and depression.   FAMILY HISTORY:  Pertinent for both parents with heart disease and a  sister with diabetes.   MEDICATIONS:  Include baby aspirin, atenolol, Cardizem, clonidine,  clorazepate, Estroven, Allegra, Lasix, insulin, Lyrica, metformin,  Micardis, Nexium, potassium, Vytorin, and a stool softener.  She is  allergic to CODEINE.   She smokes a pack a day, she drinks rarely.  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   REVIEW OF SYSTEMS:  Positive for feet swelling, joint pains, back pain,  and some loss of hearing.   PHYSICAL EXAMINATION:  VITAL SIGNS:  Pulse 65, blood pressure 132/68,  weight 201.  HEENT: EOMI. PERRLA. Sclerae are anicteric.  Conjunctivae are pink.  NECK:  Supple without thyromegaly, adenopathy or carotid bruits.  CHEST:  Clear to auscultation and percussion without adventitious  sounds.  CARDIAC:  Regular rhythm; normal S1 S2.  There are no murmurs, gallops  or rubs.  ABDOMEN:  Bowel sounds are normoactive.  Abdomen is soft, non-tender and  non-distended.  There are no abdominal masses, tenderness,  splenic  enlargement or hepatomegaly.  EXTREMITIES:  Full range of motion.  No cyanosis, clubbing or edema.  RECTAL:  Deferred.   IMPRESSION:  1. History of colonic polyposis.  2. Constipation - likely functional.  3. Diabetes.   RECOMMENDATIONS:  1. Fiber supplementation.  2. Followup colonoscopy.     Barbette Hair. Arlyce Dice, MD,FACG  Electronically Signed    RDK/MedQ  DD: 12/13/2006  DT: 12/13/2006  Job #: [id number]   cc:   Lucky Cowboy, M.D.

## 2011-04-08 NOTE — H&P (Signed)
NAMESHEALYN, SEAN               ACCOUNT NO.:  [id number]   MEDICAL RECORD NO.:  [id number]          PATIENT TYPE:  INP   LOCATION:  1823                         FACILITY:  [facility]   PHYSICIAN:  Lonia Blood, M.D.       DATE OF BIRTH:  [birth year]   DATE OF ADMISSION:  07/06/2006  DATE OF DISCHARGE:                                HISTORY & PHYSICAL   CHIEF COMPLAINT:  Abdominal pain.   HISTORY OF PRESENT ILLNESS:  Mrs. [name] is a 65 year old African American  woman with history of diabetes, hypertension and obesity who presents to  [facility] emergency room with complaints of acute on chronic abdominal  pain.  She has been having 1 month worth of abdominal pain associated with  occasional nausea and vomiting.  She presented to [facility] emergency room  tonight to have the abdominal pain evaluated.  She reports that the pain is  sharp and located in the epigastric and the periumbilical region.  The pain  is associated at times with nausea and vomiting.  The patient is worried  that she may have some gallbladder problems.  She has a history of chronic  gastritis for which she takes Nexium on a daily basis.  The patient does not  report any hematemesis.  She does not report any hematochezia or melena.  She reports that she stays fairly constipated.   PAST MEDICAL HISTORY:  Diabetes mellitus, gastroesophageal reflux disease,  hyperlipidemia, hypertension, gastritis.   HOME MEDICATIONS:  Aspirin, atenolol, Cardizem, clonidine, Tranxene, Colace,  Allegra, Lantus, Lasix, Lyrica, metformin, Micardis, Nexium, potassium  chloride and Vytorin.   SOCIAL HISTORY:  The patient is a retired Lawyer.  She does not drink alcohol.  Does not use drugs.  She smokes a pack of cigarettes a day.  She is married.   FAMILY HISTORY:  Noncontributory.   REVIEW OF SYSTEMS:  Positive for hearing problems, positive for occasional  headaches.  Positive for chest pain.  Otherwise as per HPI.  All other  systems  are negative.   PHYSICAL EXAMINATION ON ADMISSION:  Temperature 99.0, blood pressure 171/78,  pulse 70, respirations 18, pulse ox  90%.  GENERAL APPEARANCE:  An obese, cushingoid looking woman in no acute distress  sitting on the stretcher, alert, oriented place, person and time.  HEAD:  Normocephalic, atraumatic.  Eyes:  pupils equal, round, react to  light accommodation.  Extraocular movements intact.  Face:  There is  scattered acne.  Nose: clear.  NECK: Supple without JVD.  CHEST: Clear to auscultation bilaterally without wheezes, rhonchi or  crackles.  HEART:  Regular rate and rhythm without murmurs, rubs or gallop.  ABDOMEN:  Obese and soft.  Bowel sounds are present.  There is some mild  tenderness in the epigastric and periumbilical region.  No rebound no  guarding and no palpable mass.  LOWER EXTREMITIES:  Have no edema.  Skin is warm and dry.  Of note there is  increase in the hair distribution throughout the body.   LABORATORY DATA AT TIME OF ADMISSION:  Sodium 138, potassium 4.1, chloride  110,  BUN 7, glucose 98.  Urinalysis positive for proteinuria.  White blood  cell count 6.3, hemoglobin 13.6, platelet count 236.  Abdominal ultrasound  reportedly was negative.  CT scan of the abdomen reportedly showed a pre-  aortic mass.   ASSESSMENT/PLAN:  1. Abdominal pain of unclear etiology.  We suspect this is probably      related to chronic gastritis.  My plan is to admit the patient to the      hospital for observation.  Will check liver function tests and lipase      level.  Also check an EKG and one set of cardiac markers to rule out      the possibility of referred cardiac pain.  Also pain could be related      to abdominal angina but this will be definitely an diagnosis of      exclusion.  For now will continue the patient on proton pump inhibitor      but increased dose to twice a day and use Maalox as needed for her      abdominal pain.  Also of note, this patient has  significant      constipation which may be contributing to her abdominal pain.  The      patient will be continued on Colace and we are going to add also      MiraLax and Senokot to help maintain regularity.  2. Pre-aortic mass.  This problem an incidental finding.  We will proceed      with MRI of the abdomen to further characterize the mass.  3. Diabetes mellitus.  Will check a hemoglobin A1c and continue the      patient on Lantus and sliding scale insulin.  4. Hypertension.  Will continue the patient's medications in the hospital.  5. DVT prophylaxis will be done using Lovenox.      Lonia Blood, M.D.  Electronically Signed     SL/MEDQ  D:  07/06/2006  T:  07/06/2006  Job:  [id number]

## 2011-04-08 NOTE — Discharge Summary (Signed)
NAMECLISTA, [name]               ACCOUNT NO.:  [id number]   MEDICAL RECORD NO.:  [id number]          PATIENT TYPE:  INP   LOCATION:  5729                         FACILITY:  [facility]   PHYSICIAN:  Hettie Holstein, D.O.    DATE OF BIRTH:  11-26-1945   DATE OF ADMISSION:  07/05/2006  DATE OF DISCHARGE:  07/07/2006                                 DISCHARGE SUMMARY   PRIMARY CARE PHYSICIAN:  Lucky Cowboy, M.D.   RHEUMATOLOGIST:  Aundra Dubin, M.D.   PRINCIPAL DIAGNOSIS:  Resolved nausea and abdominal pain with intermittent  vomiting.   DISCHARGE DIAGNOSIS:  1. MRI evidence of retroperitoneal fibrosis.  After review of the      patient's medications, there were none that could be implicated.  She      did respond to a single dose of prednisone therapy, with complete      resolution of her symptoms.  2. Renal insufficiency, probably chronic, with a creatinine at the time of      discharge of 1.5.  3. Diabetes mellitus with labile control due to initiation of      corticosteroids.  4. Hypertension, marginal control.  The patient's antihypertensive      medications were held due to her poor oral intake on presentation.  5. History of hyperlipidemia.  6. Gastritis.  7. Gastroesophageal reflux disease.   DISCHARGE MEDICATIONS:  The patient should resume her medications as she was  on prior to admission, including:  1. Cardizem SR 120 mg daily.  2. Atenolol 50 mg daily.  3. Aspirin 81 mg daily.  4. Colace 100 mg daily with parameters to hold if she develops diarrhea.  5. Lantus.  This will likely need to be increased in the setting of      steroid therapy.  She is currently at 42 units daily.  I am asking her      to increase this by 1 unit each night until her a.m. CBG is less than      100.  6. Lasix.  She can resume at 40 twice daily with clinical reassessment for      the need to continue.  She can follow up with her primary care      physician to make adjustments.  7.  Lyrica.  She can continue as before.  8. Metformin 2 g daily as before.  9. Nexium 40 mg daily as before.  10.Potassium supplements.  She can continue this as long she is taking her      Lasix, though she should hold this if she stops her Lasix.  11.Vytorin 10/40 daily.  12.Prednisone.  She is being initiated on a taper.  She had one dose of 60      mg yesterday.  She can have a repeat today and taper to 40 mg daily x3,      then 20 mg x3 days, 10 x3 days, 5 x3 days, or until followup with her      rheumatologist, Dr. Kellie Simmering.   DISPOSITION:  [name] is in stable and improved continue prior to  discharge.  FOLLOW UP:  I am asking that she follow up with Dr. Kellie Simmering.  I have, in  fact, discussed this case with Dr. Kellie Simmering who will follow up with her in  the outpatient setting.  I have provided her with his number, and he states  that he will contact her as well.  She will follow up with Dr. Oneta Rack on  Monday, July 10, 2006 at 11 a.m.  I have discussed the case with Dr.  Oneta Rack as well.   LABORATORY DATA:  Her sed rate was 56.  Sodium 142, potassium 4.2, BUN 15,  creatinine 1.5, chloride 107, CO2 29.  WBC 8.6, hemoglobin 13.8, platelet  count 222, MCV 90.  C-reactive protein is currently pending at this time.  Cardiac markers were negative and unremarkable, with a normal total CPK.  Her TSH was 2.231, within normal range.  Lipase was normal.  Urinalysis was  unremarkable.   IMAGING STUDIES:  1. She initially underwent an ultrasound in the emergency department that      revealed no evidence of hydronephrosis.  This was negative ultrasound      of the abdomen.  2. She underwent a CT of the abdomen and pelvis that revealed an      indistinct soft tissue density anterior to the normal-caliber abdominal      aorta.  The most likely diagnosis is retroperitoneal fibrosis, although      neoplasm and possible aortitis could also have this appearance.  MRI      was suggested.  Stable  calcific density focus adjacent to the normal-      caliber common duct.  The differential considerations include retained      contrast in the duodenal diverticulum, enterolith or calcified lymph      node.  No interval change since CT previously.  There were no acute      findings in the pelvis.  3. On subsequently MRI followup scan, this revealed an ill-defined strandy      soft tissue density anterior to the abdominal aorta beginning just      below the left renal vein.  This is likely early retroperitoneal      fibrosis or sequela of aortitis.  The radiologist does not see an      enhancement to suggest an acute inflammatory process, and there was no      evidence for an aortic leak.  There was no adenopathy.  The      recommendations were for followup study in six months to reevaluate the      process.   HISTORY OF PRESENT ILLNESS:  For full details, please refer to the H&P as  dictated by Dr. Lonia Blood.  However, briefly, [name] is a 65 year old  African American female with a history of diabetes, hypertension, and  obesity who presents to the [facility] emergency room with complaints of  acute-on-chronic abdominal pain.  She had been having a months worth of  abdominal pain associated with occasional nausea and vomiting.  She  presented to the department in the evening to have her abdominal pain  evaluated.  She stated that the pain was sharp and located in the epigastric  and periumbilical region.  The pain was associated at times with nausea and  vomiting.  She thought that she may have some gallbladder difficulties.  She  had been taking Nexium on a daily basis.  She denied any hematemesis,  hematochezia, or melena.  She did report constipation.  HOSPITAL COURSE:  The patient was admitted.  She continued with the  abdominal discomfort and pain.  She, as noted above, underwent the imaging  studies and subsequently was referred to undergo MRI which revealed the above  findings.  The case was discussed with Dr. Kellie Simmering, and she was  trialed on a single dose of prednisone which  she had remarkable resolution of her symptoms.  At this time, she is being  discharged in medically stable and improved condition, to follow up with her  primary care physician to monitor continued/sustained response to steroids.  I have spoken with Dr. Kellie Simmering who will coordinate followup in the  outpatient setting.      Hettie Holstein, D.O.  Electronically Signed     ESS/MEDQ  D:  07/07/2006  T:  07/07/2006  Job:  [id number]   cc:   Lucky Cowboy, M.D.  Aundra Dubin, M.D.

## 2011-04-15 ENCOUNTER — Other Ambulatory Visit: Payer: Self-pay | Admitting: Nephrology

## 2011-04-15 ENCOUNTER — Encounter (HOSPITAL_COMMUNITY): Payer: Medicare Other

## 2011-04-20 ENCOUNTER — Ambulatory Visit (INDEPENDENT_AMBULATORY_CARE_PROVIDER_SITE_OTHER): Payer: Medicare Other | Admitting: Emergency Medicine

## 2011-04-20 ENCOUNTER — Encounter: Payer: Self-pay | Admitting: Emergency Medicine

## 2011-04-20 VITALS — BP 120/50 | HR 57 | Temp 97.8°F | Ht 65.0 in | Wt 215.0 lb

## 2011-04-20 DIAGNOSIS — R05 Cough: Secondary | ICD-10-CM

## 2011-04-20 NOTE — Assessment & Plan Note (Signed)
Will CT sinuses Incfrease nexium to bid until next OV Continue the nasal regimen as she is taking it rov 3 weeks

## 2011-04-20 NOTE — Patient Instructions (Signed)
We will increase your Nexium to twice a day until your next visit We will arrange a CT scan of your sinuses Please continue your loratadine, nasal spray and nasal saline washes exactly as you are taking them.  Follow up 3 -4 weeks

## 2011-04-20 NOTE — Progress Notes (Signed)
  Subjective:    Patient ID: [name], female    DOB: 05-Mar-1946, 65 y.o.   MRN: [id number]  HPI 48 yobf quit smoking after hospitalization [facility] 2009 never better since then with essentially nl pft's documented 05/26/09.   June 09, 2010 ov  c/o worsening cough over the past few months.  Cough mainly dry- occ produces minimal clear to yellow sputum.  Pt states that she coughs "all day and night"- gets SOB when has coughing spells- esp at night w/in 10 min of lying down but minimal actual sputum production and no premature awakening or am exacerbation.  Pt denies any significant sore throat, dysphagia, itching, sneezing,  nasal congestion or excess secretions,  fever, chills, sweats, unintended wt loss, pleuritic or exertional cp, hempoptysis, change in activity tolerance  orthopnea pnd or leg swelling   ROV 07/22/10 -- 65 yo woman. Hx mild COPD, CHF, chronic cough. Presented last time with refractory cough - Dr Sherene Sires held Spiriva, fexofenadine, atenolol. New b-b;locker per cards is coreg. Drainage much improved with chlorphenerimine. Cough is much improved. She doesn't miss the Spiriva. She was started on supplimental O2 during hosp admission last week.   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 Has been taking OTC decongestants without much relief of congestion, sore throat, cough. Started taking loratadine last week. Has been doing nasal saline washes prn.   ROV 04/20/11 - Acute OV, mixed disease, chronic cough. Last time we treated allergies - NSW, nasal steroid, loratadine for flare of coughing. Still takes Nexium. She reports that her cough eased up a bit for a few days, but then went back to same daily cough, productive of clear mucous. Seems to be worse at night. She has some sinus pain.    Review of Systems As per HPI. Also with dyspnea with exertion, walking. No CP. Occas wheeze. Strong voice.    Objective:   Physical Exam Gen: Pleasant, well-nourished, in no  distress,  normal affect  ENT: No lesions,  mouth clear,  oropharynx clear mild nasal congestion  Neck: No JVD, no TMG, no carotid bruits  Lungs: No use of accessory muscles, no dullness to percussion, clear without rales or rhonchi  Cardiovascular: RRR, 3/6 syst M with intact S2, no peripheral edema  Musculoskeletal: No deformities, no cyanosis or clubbing  Neuro: alert, non focal  Skin: Warm, no lesions or rashes   Assessment & Plan:  COUGH Will CT sinuses Incfrease nexium to bid until next OV Continue the nasal regimen as she is taking it rov 3 weeks

## 2011-04-25 ENCOUNTER — Ambulatory Visit (INDEPENDENT_AMBULATORY_CARE_PROVIDER_SITE_OTHER)
Admission: RE | Admit: 2011-04-25 | Discharge: 2011-04-25 | Disposition: A | Discharge status: Home / Self Care | Payer: Medicare Other | Source: Ambulatory Visit | Attending: Emergency Medicine | Admitting: Emergency Medicine

## 2011-04-25 DIAGNOSIS — R05 Cough: Secondary | ICD-10-CM

## 2011-04-29 ENCOUNTER — Telehealth: Payer: Self-pay | Admitting: *Deleted

## 2011-04-29 ENCOUNTER — Encounter (HOSPITAL_COMMUNITY): Payer: Medicare Other

## 2011-04-29 NOTE — Telephone Encounter (Signed)
Spoke with pt and notified that RB out of the office until 05/04/11 and will have him review results and call her. Please advise CT sinus results, thanks!

## 2011-04-29 NOTE — Telephone Encounter (Signed)
[email or web address].Jennifer Castillo, CMA  

## 2011-05-02 NOTE — Telephone Encounter (Signed)
LMTCB

## 2011-05-02 NOTE — Telephone Encounter (Signed)
PLease inform pt that her CT scan does not show any evidence for acute sinusitis. There is an area of chronic mucosal thickening on the R that is unchanged compared with August 2011. I don't believe that this is contributing to her cough. Thanks.

## 2011-05-03 NOTE — Telephone Encounter (Signed)
ATC x 3 line busy, [facility]

## 2011-05-04 NOTE — Telephone Encounter (Signed)
Spoke with pt and notified of results/recs per RB. Pt verbalized understanding, states that her cough is not getting any better at all. I sched her appt with see TP tomorrow pm at 3:45.

## 2011-05-05 ENCOUNTER — Ambulatory Visit (INDEPENDENT_AMBULATORY_CARE_PROVIDER_SITE_OTHER): Payer: Medicare Other | Admitting: Adult Health

## 2011-05-05 ENCOUNTER — Encounter: Payer: Self-pay | Admitting: Adult Health

## 2011-05-05 DIAGNOSIS — J449 Chronic obstructive pulmonary disease, unspecified: Secondary | ICD-10-CM

## 2011-05-05 MED ORDER — CEFDINIR 300 MG PO CAPS: 300.0000 mg | ORAL_CAPSULE | Freq: Two times a day (BID) | ORAL | Status: AC

## 2011-05-05 MED ORDER — HYDROCODONE-HOMATROPINE 5-1.5 MG/5ML PO SYRP: 5.0000 mL | ORAL_SOLUTION | Freq: Four times a day (QID) | ORAL | Status: AC | PRN

## 2011-05-05 NOTE — Progress Notes (Signed)
Subjective:    Patient ID: [name], female    DOB: 08-14-1946, 65 y.o.   MRN: [id number]  HPI 66 yobf quit smoking after hospitalization [facility] 2009 never better since then with essentially nl pft's documented 05/26/09.   June 09, 2010 ov  c/o worsening cough over the past few months.  Cough mainly dry- occ produces minimal clear to yellow sputum.  Pt states that she coughs "all day and night"- gets SOB when has coughing spells- esp at night w/in 10 min of lying down but minimal actual sputum production and no premature awakening or am exacerbation.  Pt denies any significant sore throat, dysphagia, itching, sneezing,  nasal congestion or excess secretions,  fever, chills, sweats, unintended wt loss, pleuritic or exertional cp, hempoptysis, change in activity tolerance  orthopnea pnd or leg swelling   ROV 07/22/10 -- 65 yo woman. Hx mild COPD, CHF, chronic cough. Presented last time with refractory cough - Dr Sherene Sires held Spiriva, fexofenadine, atenolol. New b-b;locker per cards is coreg. Drainage much improved with chlorphenerimine. Cough is much improved. She doesn't miss the Spiriva. She was started on supplimental O2 during hosp admission last week.   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 Has been taking OTC decongestants without much relief of congestion, sore throat, cough. Started taking loratadine last week. Has been doing nasal saline washes prn.   ROV 04/20/11 - Acute OV, mixed disease, chronic cough. Last time we treated allergies - NSW, nasal steroid, loratadine for flare of coughing. Still takes Nexium. She reports that her cough eased up a bit for a few days, but then went back to same daily cough, productive of clear mucous. Seems to be worse at night. She has some sinus pain. >>tx w/ claritin and nasal rinses and flonase   ROV 05/05/11 Acute OV  Pt presents for an acute office visit. Complains of persistent cough, congestion and drainage. Cough is keeping  her up night.  Using no meds for cough. Mucus is yellow and white. Mucus is very thick.  CT sinus done last ov was neg for acute changes.  Cough and congestion are getting worse.     Review of Systems Constitutional:   No  weight loss, night sweats,  Fevers, chills, + fatigue, or  lassitude.  HEENT:   No headaches,  Difficulty swallowing,  Tooth/dental problems, or  Sore throat,                No sneezing, itching, ear ache, nasal congestion, post nasal drip,   CV:  No chest pain,  Orthopnea, PND, swelling in lower extremities, anasarca, dizziness, palpitations, syncope.   GI  No heartburn, indigestion, abdominal pain, nausea, vomiting, diarrhea, change in bowel habits, loss of appetite, bloody stools.   Resp:  No excess mucus, no productive cough,   wheezing.  No chest wall deformity  Skin: no rash or lesions.  GU: no dysuria, change in color of urine, no urgency or frequency.  No flank pain, no hematuria   MS:  No joint pain or swelling.  No decreased range of motion.  No back pain.  Psych:  No change in mood or affect. No depression or anxiety.  No memory loss.         Objective:   Physical Exam GEN: A/Ox3; pleasant , NAD, well nourished   HEENT:  [place]/AT,  EACs-clear, TMs-wnl, NOSE-clear drainage , THROAT-clear, no lesions, no postnasal drip or exudate noted.   NECK:  Supple w/ fair ROM; no JVD; normal carotid impulses w/o bruits; no thyromegaly or nodules palpated; no lymphadenopathy.  RESP  Clear  P & A; w/o, wheezes/ rales/ or rhonchi.no accessory muscle use, no dullness to percussion  CARD:  RRR, no m/r/g  , no peripheral edema, pulses intact, no cyanosis or clubbing.  GI:   Soft & nt; nml bowel sounds; no organomegaly or masses detected.  Musco: Warm bil, no deformities or joint swelling noted.   Neuro: alert, no focal deficits noted.    Skin: Warm, no lesions or rashes         Assessment & Plan:

## 2011-05-05 NOTE — Patient Instructions (Addendum)
Omnicef 300mg  Twice daily  For 7 days Mucinex DM Twice daily  As needed  Cough/congestion  Hydromet 1-2 tsp every 4-6 hr As needed  Cough-may make you sleepy.  Please contact office for sooner follow up if symptoms do not improve or worsen or seek emergency care

## 2011-05-05 NOTE — Assessment & Plan Note (Signed)
Flare   Plan:  Omnicef 300mg  Twice daily  For 7 days Mucinex DM Twice daily  As needed  Cough/congestion  Hydromet 1-2 tsp every 4-6 hr As needed  Cough-may make you sleepy.  Please contact office for sooner follow up if symptoms do not improve or worsen or seek emergency care

## 2011-05-06 ENCOUNTER — Other Ambulatory Visit: Payer: Self-pay | Admitting: Nephrology

## 2011-05-06 ENCOUNTER — Encounter (HOSPITAL_COMMUNITY): Payer: Medicare Other | Attending: Nephrology

## 2011-05-06 DIAGNOSIS — N184 Chronic kidney disease, stage 4 (severe): Secondary | ICD-10-CM | POA: Insufficient documentation

## 2011-05-06 DIAGNOSIS — D638 Anemia in other chronic diseases classified elsewhere: Secondary | ICD-10-CM | POA: Insufficient documentation

## 2011-05-06 LAB — RENAL FUNCTION PANEL
CO2: 36 mEq/L — ABNORMAL HIGH (ref 19–32)
Chloride: 94 mEq/L — ABNORMAL LOW (ref 96–112)
GFR calc Af Amer: 10 mL/min — ABNORMAL LOW (ref >60)
Glucose, Bld: 49 mg/dL — ABNORMAL LOW (ref 70–99)
Phosphorus: 6 mg/dL — ABNORMAL HIGH (ref 2.3–4.6)
Potassium: 3 mEq/L — ABNORMAL LOW (ref 3.5–5.1)
Sodium: 140 mEq/L (ref 135–145)

## 2011-05-06 LAB — IRON AND TIBC
Iron: 77 ug/dL (ref 42–135)
TIBC: 323 ug/dL (ref 250–470)
UIBC: 246 ug/dL

## 2011-05-12 ENCOUNTER — Encounter: Payer: Self-pay | Admitting: Emergency Medicine

## 2011-05-12 ENCOUNTER — Ambulatory Visit (INDEPENDENT_AMBULATORY_CARE_PROVIDER_SITE_OTHER): Payer: Medicare Other | Admitting: Emergency Medicine

## 2011-05-12 DIAGNOSIS — J449 Chronic obstructive pulmonary disease, unspecified: Secondary | ICD-10-CM

## 2011-05-12 DIAGNOSIS — R05 Cough: Secondary | ICD-10-CM

## 2011-05-12 MED ORDER — ALBUTEROL SULFATE HFA 108 (90 BASE) MCG/ACT IN AERS: 2.0000 | INHALATION_SPRAY | RESPIRATORY_TRACT | Status: DC | PRN

## 2011-05-12 NOTE — Patient Instructions (Signed)
Please continue your loratadine, flonase spray and nasal saline washes Continue your nexium twice a day We will call in an albuterol inhaler to your pharmacy for you to use as needed Follow with Dr Delton Coombes in 6 months or sooner if you have any trouble.

## 2011-05-12 NOTE — Assessment & Plan Note (Signed)
SABA prn  

## 2011-05-12 NOTE — Assessment & Plan Note (Signed)
Continue allergy and GERD regimens Prn albuterol for her mixed disease F/u 6 mo or prn

## 2011-05-12 NOTE — Progress Notes (Signed)
Subjective:    Patient ID: [name], female    DOB: [birth year], 65 y.o.   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   June 09, 2010 ov  c/o worsening cough over the past few months.  Cough mainly dry- occ produces minimal clear to yellow sputum.  Pt states that she coughs "all day and night"- gets SOB when has coughing spells- esp at night w/in 10 min of lying down but minimal actual sputum production and no premature awakening or am exacerbation.  Pt denies any significant sore throat, dysphagia, itching, sneezing,  nasal congestion or excess secretions,  fever, chills, sweats, unintended wt loss, pleuritic or exertional cp, hempoptysis, change in activity tolerance  orthopnea pnd or leg swelling   ROV 07/22/10 -- 65 yo woman. Hx mild COPD, CHF, chronic cough. Presented last time with refractory cough - Dr Sherene Sires held Spiriva, fexofenadine, atenolol. New b-b;locker per cards is coreg. Drainage much improved with chlorphenerimine. Cough is much improved. She doesn't miss the Spiriva. She was started on supplimental O2 during hosp admission last week.   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 Has been taking OTC decongestants without much relief of congestion, sore throat, cough. Started taking loratadine last week. Has been doing nasal saline washes prn.   ROV 04/20/11 - Acute OV, mixed disease, chronic cough. Last time we treated allergies - NSW, nasal steroid, loratadine for flare of coughing. Still takes Nexium. She reports that her cough eased up a bit for a few days, but then went back to same daily cough, productive of clear mucous. Seems to be worse at night. She has some sinus pain. >>tx w/ claritin and nasal rinses and flonase   ROV 05/05/11 Acute OV  Pt presents for an acute office visit. Complains of persistent cough, congestion and drainage. Cough is keeping her up night.  Using no meds for  cough. Mucus is yellow and white. Mucus is very thick.  CT sinus done last ov was neg for acute changes.  Cough and congestion are getting worse.   ROV 05/12/11 -- 65 woman, former tobacco, mixed dz on Spirio, chronic cough with allergic rhinitis. She was seen last week with refractory cough, prod of tan mucous, treated for acute bronchitis and is now improved. She remains on loratadine + flonase + [place]. Also on nexium bid. Not on any inhaled medications. She is preparing to get cataract     Review of Systems Constitutional:   No  weight loss, night sweats,  Fevers, chills, + fatigue, or  lassitude.  HEENT:   No headaches,  Difficulty swallowing,  Tooth/dental problems, or  Sore throat,                No sneezing, itching, ear ache, nasal congestion, Nasal gtt improved compared w last visit   CV:  No chest pain,  Orthopnea, PND, swelling in lower extremities, anasarca, dizziness, palpitations, syncope.   GI  No heartburn, indigestion, abdominal pain, nausea, vomiting, diarrhea, change in bowel habits, loss of appetite, bloody stools.   Resp:  No excess mucus, no productive cough,   wheezing.  No chest wall deformity  Skin: no rash or lesions.  GU: no dysuria, change in color of urine, no urgency or frequency.  No flank pain, no hematuria   MS:  No joint pain or swelling.  No decreased range of motion.  No back  pain.  Psych:  No change in mood or affect. No depression or anxiety.  No memory loss.      Objective:   Physical Exam GEN: A/Ox3; pleasant , NAD, well nourished   HEENT:  [place]/AT,  EACs-clear, TMs-wnl, NOSE-clear drainage , THROAT-clear, no lesions, no postnasal drip or exudate noted.   NECK:  Supple w/ fair ROM; no JVD; normal carotid impulses w/o bruits; no thyromegaly or nodules palpated; no lymphadenopathy.  RESP  Clear  P & A; w/o, wheezes/ rales/ or rhonchi.no accessory muscle use, no dullness to percussion  CARD:  RRR, no m/r/g  , no peripheral edema, pulses intact,  no cyanosis or clubbing.  Musco: Warm bil, no deformities or joint swelling noted.   Neuro: alert, no focal deficits noted.    Skin: Warm, no lesions or rashes      Assessment & Plan:  COUGH Continue allergy and GERD regimens Prn albuterol for her mixed disease F/u 6 mo or prn   COPD, MILD SABA prn

## 2011-05-17 ENCOUNTER — Telehealth: Payer: Self-pay | Admitting: Cardiology

## 2011-05-17 NOTE — Telephone Encounter (Signed)
OK to discontinue O2 

## 2011-05-17 NOTE — Telephone Encounter (Signed)
Will request order from MD to have 02 d/ced

## 2011-05-17 NOTE — Telephone Encounter (Signed)
Pt doing very well and wants to dc her oxygen with [facility], needs dc order called or faxed to them

## 2011-05-20 ENCOUNTER — Other Ambulatory Visit: Payer: Self-pay | Admitting: Nephrology

## 2011-05-20 ENCOUNTER — Encounter (HOSPITAL_COMMUNITY): Payer: Medicare Other

## 2011-05-23 ENCOUNTER — Telehealth: Payer: Self-pay | Admitting: Cardiology

## 2011-05-23 NOTE — Telephone Encounter (Signed)
Pt aware that 02 will continue as ordered.

## 2011-05-23 NOTE — Telephone Encounter (Signed)
Pt calling to let know pt no longer wants to be taken off oxygen. Pt wants to know if pt needs a renewal for oxygen so medicare will pay for it. Please fax a renewal for oxygen to [facility].

## 2011-05-23 NOTE — Telephone Encounter (Signed)
Pt has called back and stated she does not want 02 discontinued.  OK to continue as needed

## 2011-06-03 ENCOUNTER — Other Ambulatory Visit: Payer: Self-pay | Admitting: Nephrology

## 2011-06-03 ENCOUNTER — Encounter (HOSPITAL_COMMUNITY): Payer: Medicare Other | Attending: Nephrology

## 2011-06-03 ENCOUNTER — Telehealth: Payer: Self-pay | Admitting: Cardiology

## 2011-06-03 DIAGNOSIS — N184 Chronic kidney disease, stage 4 (severe): Secondary | ICD-10-CM | POA: Insufficient documentation

## 2011-06-03 DIAGNOSIS — D638 Anemia in other chronic diseases classified elsewhere: Secondary | ICD-10-CM | POA: Insufficient documentation

## 2011-06-03 LAB — RENAL FUNCTION PANEL
BUN: 82 mg/dL — ABNORMAL HIGH (ref 6–23)
CO2: 35 mEq/L — ABNORMAL HIGH (ref 19–32)
Calcium: 9.7 mg/dL (ref 8.4–10.5)
GFR calc Af Amer: 9 mL/min — ABNORMAL LOW (ref >60)
Glucose, Bld: 84 mg/dL (ref 70–99)
Potassium: 3.9 mEq/L (ref 3.5–5.1)
Sodium: 141 mEq/L (ref 135–145)

## 2011-06-03 LAB — IRON AND TIBC
Saturation Ratios: 23 % (ref 20–55)
TIBC: 315 ug/dL (ref 250–470)

## 2011-06-03 LAB — POCT HEMOGLOBIN-HEMACUE: Hemoglobin: 11.2 g/dL — ABNORMAL LOW (ref 12.0–15.0)

## 2011-06-03 NOTE — Telephone Encounter (Signed)
Per pt call, pt needs DR to confirm that pt still needs to be on oxygen. Pt year will be up in October. Pt needs to get clearance from a DR and/or be seen by a DR to confirm pt still needs to be on oxygen. The earliest appt available for Dr. Antoine Poche is Oct 2nd. Pt would like to know if the PA can give her clearance before then. Please return pt call and advise.

## 2011-06-03 NOTE — Telephone Encounter (Signed)
Per pt - she needs an evaluation to follow up on whether she needs to continue 02 or not.  [facility] is requiring this as the pt 02 was ordered "one year ago".  Pt scheduled for PA 7/31 she is agreeable.

## 2011-06-15 ENCOUNTER — Ambulatory Visit (HOSPITAL_COMMUNITY)
Admission: RE | Admit: 2011-06-15 | Discharge: 2011-06-15 | Disposition: A | Discharge status: Home / Self Care | Payer: Medicare Other | Source: Ambulatory Visit | Attending: Internal Medicine | Admitting: Internal Medicine

## 2011-06-15 ENCOUNTER — Inpatient Hospital Stay (HOSPITAL_COMMUNITY)
Admission: EM | Admit: 2011-06-15 | Discharge: 2011-06-17 | DRG: 292 | Disposition: A | Discharge status: Home Health | Payer: Medicare Other | Attending: Family Medicine | Admitting: Family Medicine

## 2011-06-15 ENCOUNTER — Emergency Department (HOSPITAL_COMMUNITY): Payer: Medicare Other

## 2011-06-15 ENCOUNTER — Other Ambulatory Visit (HOSPITAL_COMMUNITY): Payer: Self-pay | Admitting: Internal Medicine

## 2011-06-15 DIAGNOSIS — R0902 Hypoxemia: Secondary | ICD-10-CM | POA: Insufficient documentation

## 2011-06-15 DIAGNOSIS — Z7982 Long term (current) use of aspirin: Secondary | ICD-10-CM

## 2011-06-15 DIAGNOSIS — R062 Wheezing: Secondary | ICD-10-CM | POA: Insufficient documentation

## 2011-06-15 DIAGNOSIS — E785 Hyperlipidemia, unspecified: Secondary | ICD-10-CM | POA: Diagnosis present

## 2011-06-15 DIAGNOSIS — K219 Gastro-esophageal reflux disease without esophagitis: Secondary | ICD-10-CM | POA: Diagnosis present

## 2011-06-15 DIAGNOSIS — I5033 Acute on chronic diastolic (congestive) heart failure: Principal | ICD-10-CM | POA: Diagnosis present

## 2011-06-15 DIAGNOSIS — I509 Heart failure, unspecified: Secondary | ICD-10-CM | POA: Diagnosis present

## 2011-06-15 DIAGNOSIS — D638 Anemia in other chronic diseases classified elsewhere: Secondary | ICD-10-CM | POA: Diagnosis present

## 2011-06-15 DIAGNOSIS — F3289 Other specified depressive episodes: Secondary | ICD-10-CM | POA: Diagnosis present

## 2011-06-15 DIAGNOSIS — I517 Cardiomegaly: Secondary | ICD-10-CM | POA: Insufficient documentation

## 2011-06-15 DIAGNOSIS — E119 Type 2 diabetes mellitus without complications: Secondary | ICD-10-CM | POA: Diagnosis present

## 2011-06-15 DIAGNOSIS — I129 Hypertensive chronic kidney disease with stage 1 through stage 4 chronic kidney disease, or unspecified chronic kidney disease: Secondary | ICD-10-CM | POA: Diagnosis present

## 2011-06-15 DIAGNOSIS — Z794 Long term (current) use of insulin: Secondary | ICD-10-CM

## 2011-06-15 DIAGNOSIS — E876 Hypokalemia: Secondary | ICD-10-CM | POA: Diagnosis present

## 2011-06-15 DIAGNOSIS — N184 Chronic kidney disease, stage 4 (severe): Secondary | ICD-10-CM | POA: Diagnosis present

## 2011-06-15 DIAGNOSIS — Z87891 Personal history of nicotine dependence: Secondary | ICD-10-CM

## 2011-06-15 DIAGNOSIS — Z79899 Other long term (current) drug therapy: Secondary | ICD-10-CM

## 2011-06-15 DIAGNOSIS — F329 Major depressive disorder, single episode, unspecified: Secondary | ICD-10-CM | POA: Diagnosis present

## 2011-06-15 LAB — DIFFERENTIAL
Eosinophils Absolute: 0.3 10*3/uL (ref 0.0–0.7)
Eosinophils Relative: 3 % (ref 0–5)
Lymphs Abs: 2.1 10*3/uL (ref 0.7–4.0)
Monocytes Relative: 8 % (ref 3–12)

## 2011-06-15 LAB — CBC
MCH: 29.6 pg (ref 26.0–34.0)
MCHC: 32.5 g/dL (ref 30.0–36.0)
MCV: 91 fL (ref 78.0–100.0)
Platelets: 185 10*3/uL (ref 150–400)
RDW: 17.2 % — ABNORMAL HIGH (ref 11.5–15.5)

## 2011-06-15 LAB — BASIC METABOLIC PANEL
Calcium: 9.4 mg/dL (ref 8.4–10.5)
Creatinine, Ser: 5.97 mg/dL — ABNORMAL HIGH (ref 0.50–1.10)
GFR calc Af Amer: 9 mL/min — ABNORMAL LOW (ref >60)

## 2011-06-15 LAB — PRO B NATRIURETIC PEPTIDE: Pro B Natriuretic peptide (BNP): 1742 pg/mL — ABNORMAL HIGH (ref 0–125)

## 2011-06-15 LAB — CK TOTAL AND CKMB (NOT AT ARMC): Total CK: 237 U/L — ABNORMAL HIGH (ref 7–177)

## 2011-06-15 LAB — PHOSPHORUS: Phosphorus: 5.1 mg/dL — ABNORMAL HIGH (ref 2.3–4.6)

## 2011-06-16 ENCOUNTER — Encounter: Payer: Self-pay | Admitting: Physician Assistant

## 2011-06-16 DIAGNOSIS — R0989 Other specified symptoms and signs involving the circulatory and respiratory systems: Secondary | ICD-10-CM

## 2011-06-16 DIAGNOSIS — R0609 Other forms of dyspnea: Secondary | ICD-10-CM

## 2011-06-16 LAB — CARDIAC PANEL(CRET KIN+CKTOT+MB+TROPI)
CK, MB: 3.4 ng/mL (ref 0.3–4.0)
CK, MB: 3.3 ng/mL (ref 0.3–4.0)
Relative Index: 1.8 (ref 0.0–2.5)
Relative Index: 1.9 (ref 0.0–2.5)
Relative Index: 2.2 (ref 0.0–2.5)
Total CK: 181 U/L — ABNORMAL HIGH (ref 7–177)
Total CK: 184 U/L — ABNORMAL HIGH (ref 7–177)
Troponin I: <0.3 ng/mL (ref <0.30)
Troponin I: <0.3 ng/mL (ref <0.30)
Troponin I: <0.3 ng/mL (ref <0.30)
Troponin I: <0.3 ng/mL (ref <0.30)

## 2011-06-16 LAB — GLUCOSE, CAPILLARY: Glucose-Capillary: 142 mg/dL — ABNORMAL HIGH (ref 70–99)

## 2011-06-16 LAB — COMPREHENSIVE METABOLIC PANEL
BUN: 87 mg/dL — ABNORMAL HIGH (ref 6–23)
Calcium: 9.4 mg/dL (ref 8.4–10.5)
Creatinine, Ser: 6.02 mg/dL — ABNORMAL HIGH (ref 0.50–1.10)
GFR calc Af Amer: 8 mL/min — ABNORMAL LOW (ref >60)
Glucose, Bld: 142 mg/dL — ABNORMAL HIGH (ref 70–99)
Sodium: 140 mEq/L (ref 135–145)
Total Protein: 7.1 g/dL (ref 6.0–8.3)

## 2011-06-16 LAB — LIPID PANEL
Cholesterol: 131 mg/dL (ref 0–200)
VLDL: 39 mg/dL (ref 0–40)

## 2011-06-17 LAB — RENAL FUNCTION PANEL
Albumin: 3.2 g/dL — ABNORMAL LOW (ref 3.5–5.2)
CO2: 35 mEq/L — ABNORMAL HIGH (ref 19–32)
Calcium: 9.4 mg/dL (ref 8.4–10.5)
Chloride: 98 mEq/L (ref 96–112)
GFR calc Af Amer: 9 mL/min — ABNORMAL LOW (ref >60)
GFR calc non Af Amer: 7 mL/min — ABNORMAL LOW (ref >60)
Sodium: 141 mEq/L (ref 135–145)

## 2011-06-17 LAB — GLUCOSE, CAPILLARY

## 2011-06-19 NOTE — Discharge Summary (Signed)
[name], [name]               ACCOUNT NO.:  [id number]  MEDICAL RECORD NO.:  [id number]  LOCATION:  4733                         FACILITY:  [facility]  PHYSICIAN:  Pleas Koch, MD        DATE OF BIRTH:  February 02, 1946  DATE OF ADMISSION:  06/15/2011 DATE OF DISCHARGE:  06/17/2011                              DISCHARGE SUMMARY   CURRENT FACILITY:  [facility].  DIAGNOSES: 1. Congestive heart failure exacerbation. 2. Stage IV chronic kidney disease. 3. Dyslipidemia. 4. Anemia of chronic disease. 5. Diabetes mellitus A1c 8.8. 6. Hypokalemia.  DISCHARGE MEDICATIONS:  Magnesium 200 mg over-the-counter 1 tablet daily.  NEW MEDICATION: 1. Tums 1-2 tablets t.i.d. with meals. 2. Vigamox 1 drop t.i.d. 3. Lyrica. 4. Gabapentin 3 mg b.i.d. 5. Celexa 40 mg 1 tablet daily. 6. Coreg 6.25 mg 1 tablet b.i.d. with meals.  I have discontinued her     Cardizem given she had low heart rates in the emergency room.  She     will continue on clonidine 0.3 mg 1 tablet daily.  I have     discontinued her Vytorin. 7. Pred forte 1% to right eye t.i.d. 8. Fluticasone 2-3 sprays daily p.r.n. 9. Lipitor 40 mg 1 tablet daily. 10.Humalog insulin 14-26 units a.c. h.s. and 60 units of Lantus daily. 11.Lasix 160 mg 1 tablet b.i.d.  The patient is to continue this     dosage until seen by nephrologist Dr. Hyman Hopes. 12.Bromfenac sodium ophthalmic solution 1 drop t.i.d. 13.Tramadol 1 tablet q.4 p.r.n. 14.Aspirin 81 1 tablet daily. 15.PhosLo 667 1 tablet t.i.d. with meals. I have discontinued her     Nexium. 16.Potassium chloride 20 mEq 2 tablets b.i.d. 17.Claritin 10 mg 1 tablet daily. I have discontinued her Zaroxolyn,     she will continue vitamin B12 and 1000 mcg daily. 18  Vitamin D3 5000 international 2 tablets daily. 1. Zemplar 1 mcg 1 tablet Monday, Wednesday, Friday.  IMAGING STUDIES:  Chest x-ray showed cardiomegaly, mild interstitial edema.  HOSPITAL COURSE:  This is a 65 year old female  who presented to [facility] emergency room with exacerbation of chronic dyspnea for 3-4 days and it has been getting progressively worse.  She went to her PCP's office and blood pressure and heart rate we low and she was advised to go hospital. She has gained 6-7 pounds in last 2 months and endorse chronic cough. Baseline creatinine seems to be 4.5 and 5.5.  Renal team was consulted to help see her.  The patient did have AV fistula placed July 06, 2010.  On admission temperature is 98.1, blood pressure was 126-155/60-68, pulse 64, satting 100 on 2 liters.  She is resting comfortably.  She had a 2/6 systolic murmur.  She was clear to auscultation bilaterally. Abdomen is soft, nondistended 1+ edema.  A1c was 8.8.  TSH was 2.8. BUN, creatinine of 87 and 6.32, phosphorus 5.1.  Hemoglobin 11.5, hematocrit 35.4, platelets 185, white count 8.9.  HOSPITAL COURSE: 1. CKD stage 4-5.  Dr. Lowell Guitar of the Nephrology was consulted.  They     recommended increasing transiently dose of IV Lasix to 120 b.i.d.     and subsequently placed on 160  b.i.d. orally which the patient will     continue at home.  The patient's weight over the course of stay     here has actually gone up from 96-98, but she feels subjectively     better.  She does have history of CHF as well.  Her echo done June 16, 2011. 2. CHF.  She had a severely increased PA peak pressures 72 mmHg and     likely need outpatient follow-up with pulmonary.  Her EF was 55%-     60%.  She had grade 1 diastolic dysfunction.  Again, she will be on     Lasix until seen by Nephrology. 3. Dyslipidemia.  The patient's cholesterol was noted to be well-     controlled.  She was on Vytorin and Lisinopril.  The patient     admitted to having difficulty affording all medications, hence I     have simplified her medication to just her Lisinopril which she     will continue.  there is some conflicting evidence about the Utility of Vytorin over statin anyway 4.  Hypertension.  Blood pressure was well-controlled while in the     hospital.  Her daughter relates that her pulse rate has been low in     the 50s to 60s hence I have discontinue completely her Cardizem.     The patient's daughter states that the heart rate was as low 30s to     40s in the emergency room and she will continue on Coreg.  If her     heart rate can tolerate and blood pressure does go up she can be     implemented on Cardizem as an outpatient.  Blood pressure on discharge     138/63. 5. Anemia of chronic disease.  She had a hemoglobin of 11.5 on June 15, 2011 appears to have this chronically this may be related to     her renal insufficiency and low erythropoietin.  I will leave this     up to Dr. Hyman Hopes to determine if she needs to be on Aranesp certainly     but right now she does not appear to need this. 6. Hyperkalemia.  This was replaced orally and this resolved.  The patient was seen on day of discharge.  I had an extensive discussion with her.  She appears to need requalification for O2 and does requalify for the same and will go back home on O2 by nasal cannula 2 liters. I have attempted to simply her medications and she was seen by Nephrology and deemed fit for discharge.  Hence I feel comfortable sending her home today.  She will need to follow up with her primary care physician, Dr. Lucky Cowboy in the near future as well as Dr. Hyman Hopes and she will need a pulmonary followup.  I would recommend getting a BMP in about 1 week.          ______________________________ Pleas Koch, MD     JS/MEDQ  D:  06/17/2011  T:  06/17/2011  Job:  [id number]  Electronically Signed by Pleas Koch MD on 06/19/2011 12:45:40 PM

## 2011-06-20 ENCOUNTER — Encounter: Payer: Self-pay | Admitting: Cardiology

## 2011-06-20 ENCOUNTER — Encounter (HOSPITAL_COMMUNITY): Payer: Medicare Other

## 2011-06-21 ENCOUNTER — Ambulatory Visit: Payer: Medicare Other | Admitting: Physician Assistant

## 2011-06-27 NOTE — Consult Note (Signed)
[name], [name]               ACCOUNT NO.:  [id number]  MEDICAL RECORD NO.:  [id number]  LOCATION:  4733                         FACILITY:  [facility]  PHYSICIAN:  Mindi Slicker. Lowell Guitar, M.D.  DATE OF BIRTH:  10/26/1946  DATE OF CONSULTATION:  06/16/2011 DATE OF DISCHARGE:                                CONSULTATION   PRIMARY CARE PHYSICIAN:  Jeoffrey Massed, MD  NEPHROLOGIST:  Garnetta Buddy, MD  CHIEF COMPLAINT:  Shortness of breath.  HISTORY OF PRESENT ILLNESS:  Briefly, this is a 65 year old African American female with a history of diabetes, hypertension, chronic kidney disease stage IV, and diastolic heart failure who presents with shortness of breath x3-4 days.  The patient has chronic dyspnea with exertion, but she says that the dyspnea has been getting progressively worse in the last few days.  The patient went to her PCP's office yesterday.  She was told that her blood pressure and her heart rate were both low and was advised to go to the hospital.  The patient states that she has gained 6-7 pounds in the last 2 months.  She believes it is all fluid retention.  She also endorses chronic cough.  The patient is followed by Dr. Hyman Hopes at [facility].  According to her previous medical records, her baseline creatinine appears to be between 4.5-5.5. Today, her creatinine is 6.02.  The Renal Team was consulted to help co- manage chronic kidney disease in the setting of acute CHF exacerbation. Of note, the patient had an AV fistula placed on July 06, 2010.  ALLERGIES:  CODEINE.  INPATIENT MEDICATIONS: 1. Aspirin 81 mg p.o. daily. 2. Calcium carbonate 650 one to two tablets p.o. b.i.d. 3. Coreg 18.75 mg 1 tablet p.o. b.i.d. 4. Celexa 20 mg 1 tablet p.o. daily. 5. Vitamin D 2000 units p.o. at bedtime. 6. Clonidine 0.3 mg 1 tablet p.o. b.i.d. 7. Tiazac 360 mg 1 tablet p.o. daily. 8. Zetia 10 mg 1 tablet p.o. at bedtime. 9. Lasix 40 mg 1-1/2 tablets p.o.  b.i.d. 10.Sliding scale insulin. 11.Lantus 25 units b.i.d. 12.Bystolic 10 mg 1 tablet p.o. at bedtime. 13.Protonix 40 mg p.o. daily. 14.Zemplar 1 mg on Monday, Wednesday, and Friday. 15.Lyrica 50 mg 1 tablet p.o. b.i.d. 16.Albuterol. 17.Tramadol 50 mg 1 tablet p.o. q.4 h. p.r.n. for pain.  PAST MEDICAL HISTORY: 1. Diabetes, insulin dependent. 2. Hypertension. 3. Hyperlipidemia. 4. Obesity. 5. Chronic kidney disease, stage IV-V. 6. Diastolic CHF.  EF 60%. 7. GERD. 8. Depression. 9. Degenerative joint disease.  PAST SURGICAL HISTORY: 1. Status post left upper extremity AV fistula on July 06, 2010. 2. Colonoscopy with polypectomy.  SOCIAL HISTORY:  The patient lives with her husband in [place].  She is a retired Lawyer.  She is a former smoker.  She quit in 2009, smoked for over 40 years.  She denies any alcohol or drug abuse.  FAMILY HISTORY:  Mother is deceased in her 62s, she had coronary artery disease.  Father is deceased in his 31s, also coronary artery disease. Siblings medical history unknown.  REVIEW OF SYSTEMS:  The patient endorses increased weight gain, chest pressure, edema, orthopnea, PND, cough, and shortness of breath.  She denies fevers, chills, sweats, headache, or sputum.  She denies nausea, vomiting, diarrhea, or dysuria.  PHYSICAL EXAMINATION:  VITAL SIGNS:  Temperature 98.1, pulse 64, respirations 20, blood pressure 126-155 systolic over 60-68 diastolic, pulse ox 100% on 2 liters, and weight 96.3 kg. GENERAL:  She is pleasant.  She is resting comfortably, in no acute distress. HEENT:  NCAT. NECK:  Normal range of motion.  No evidence of JVD at this time. CARDIOVASCULAR:  Regular rate and rhythm.  2/6 systolic murmur. LUNGS:  Clear to auscultation bilaterally.  No wheezes, rales, or rhonchi. ABDOMEN:  Soft, nontender, and nondistended with active bowel sounds. EXTREMITIES:  1+ pedal edema. NEURO:  No focal deficits. MUSCULOSKELETAL:  2+ distal  pulses bilaterally.  LABORATORY DATA AND STUDIES:  Hemoglobin A1c 8.8.  TSH 2.8.  CMET: Sodium 148, potassium 3.2, chloride 96, CO2 of 31, BUN 87, creatinine 6.32, and glucose 142.  GFR 8.  Liver enzymes within normal limits. Albumin 3.2, calcium 9.4, phosphorus 5.1, mag 3.2.  BMP 1754.  CBC showed a white count of 8.9, hemoglobin 11.5, hematocrit 35.4, and platelets 185.  Chest x-ray showed cardiomegaly with mild interstitial edema.  ASSESSMENT/PLAN:  This is a 65 year old female with a history of chronic kidney disease stage IV-V and congestive heart failure who presents with shortness of breath. 1. Chronic kidney disease stage IV-V.  Creatinine is mildly elevated     compared to baseline.  The patient appears to have fluid overload     with evidence of edema on chest x-ray.  We will discontinue IV     fluids and increase Lasix to 120 mg IV b.i.d.  We will repeat a     renal panel in the morning and get an intact PTH.  No need for     hemodialysis today. 2. Diastolic heart failure.  A repeat echo pending.  The last echo in     February 2011 showed an EF of 60%.  Chest x-ray showed mild     interstitial edema.  We will increase Lasix to 120 mg IV b.i.d. 3. Hypertension.  Blood pressure is currently stable.  Continue with     Coreg, clonidine, Tiazac, Lasix, and Bystolic. 4. Diabetes.  CBGs ranging from 142-248.  Continue sliding scale     insulin.  May need to increase Lantus per Primary Team. 5. Anemia.  Hemoglobin is currently stable.  The patient may benefit     from oral iron supplement. 6. Hypokalemia.  Potassium is still low today.  Consider giving K-Dur     40 mEq p.o. x1 dose now and then a second dose in 8 hours.  We will     recheck potassium in the morning.    ______________________________ Barnabas Lister, MD   ______________________________ Mindi Slicker. Lowell Guitar, M.D.    ID/MEDQ  D:  06/16/2011  T:  06/17/2011  Job:  [id number]  Electronically Signed by Barnabas Lister  MD on 06/18/2011 03:07:36 PM Electronically Signed by Casimiro Needle M.D. on 06/27/2011 06:32:48 PM

## 2011-07-07 ENCOUNTER — Other Ambulatory Visit: Payer: Self-pay | Admitting: Nephrology

## 2011-07-07 ENCOUNTER — Encounter (HOSPITAL_COMMUNITY): Payer: Medicare Other | Attending: Nephrology

## 2011-07-07 DIAGNOSIS — D638 Anemia in other chronic diseases classified elsewhere: Secondary | ICD-10-CM | POA: Insufficient documentation

## 2011-07-07 DIAGNOSIS — N184 Chronic kidney disease, stage 4 (severe): Secondary | ICD-10-CM | POA: Insufficient documentation

## 2011-07-07 LAB — IRON AND TIBC
Saturation Ratios: 22 % (ref 20–55)
TIBC: 299 ug/dL (ref 250–470)
UIBC: 233 ug/dL

## 2011-07-07 LAB — RENAL FUNCTION PANEL
CO2: 30 mEq/L (ref 19–32)
Calcium: 10.9 mg/dL — ABNORMAL HIGH (ref 8.4–10.5)
Chloride: 98 mEq/L (ref 96–112)
GFR calc Af Amer: 9 mL/min — ABNORMAL LOW (ref >60)
GFR calc non Af Amer: 8 mL/min — ABNORMAL LOW (ref >60)
Glucose, Bld: 59 mg/dL — ABNORMAL LOW (ref 70–99)
Sodium: 141 mEq/L (ref 135–145)

## 2011-07-11 ENCOUNTER — Telehealth: Payer: Self-pay | Admitting: Emergency Medicine

## 2011-07-11 ENCOUNTER — Encounter: Payer: Self-pay | Admitting: Emergency Medicine

## 2011-07-11 ENCOUNTER — Ambulatory Visit (INDEPENDENT_AMBULATORY_CARE_PROVIDER_SITE_OTHER): Payer: Medicare Other | Admitting: Emergency Medicine

## 2011-07-11 VITALS — BP 140/76 | HR 67 | Temp 98.4°F | Ht 64.0 in | Wt 216.2 lb

## 2011-07-11 DIAGNOSIS — R05 Cough: Secondary | ICD-10-CM

## 2011-07-11 MED ORDER — AZITHROMYCIN 250 MG PO TABS: ORAL_TABLET | ORAL | Status: AC

## 2011-07-11 MED ORDER — AZELASTINE HCL 0.1 % NA SOLN: 1.0000 | Freq: Two times a day (BID) | NASAL | Status: DC

## 2011-07-11 NOTE — Patient Instructions (Signed)
We will change your fluticasone spray to astepro 2 sprays each nostril twice a day Continue your loratadine, nexium and nasal washes as you are taking them Use Ventolin only as needed Take azithromycin x 5 days.  Follow up with Dr Delton Coombes in 1 month

## 2011-07-11 NOTE — Assessment & Plan Note (Signed)
Due largely to uncontrolled PND - will adjust allergy regimen and treat for possible bronchitis - azithro x 5 days - astepro substituted for fluticasone - loratadine - ventolin prn - rov 1 mon

## 2011-07-11 NOTE — Progress Notes (Signed)
Subjective:    Patient ID: [name], female    DOB: 06/04/1946, 65 y.o.   MRN: [id number]  HPI 79 yobf quit smoking after hospitalization [facility] 2009, mixed dz by PFT, chronic cough.   June 09, 2010 ov  c/o worsening cough over the past few months.  Cough mainly dry- occ produces minimal clear to yellow sputum.  Pt states that she coughs "all day and night"- gets SOB when has coughing spells- esp at night w/in 10 min of lying down but minimal actual sputum production and no premature awakening or am exacerbation.  Pt denies any significant sore throat, dysphagia, itching, sneezing,  nasal congestion or excess secretions,  fever, chills, sweats, unintended wt loss, pleuritic or exertional cp, hempoptysis, change in activity tolerance  orthopnea pnd or leg swelling   ROV 07/22/10 -- 65 yo woman. Hx mild COPD, CHF, chronic cough. Presented last time with refractory cough - Dr Sherene Sires held Spiriva, fexofenadine, atenolol. New b-b;locker per cards is coreg. Drainage much improved with chlorphenerimine. Cough is much improved. She doesn't miss the Spiriva. She was started on supplimental O2 during hosp admission last week.   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 Has been taking OTC decongestants without much relief of congestion, sore throat, cough. Started taking loratadine last week. Has been doing nasal saline washes prn.   ROV 04/20/11 - Acute OV, mixed disease, chronic cough. Last time we treated allergies - [place], nasal steroid, loratadine for flare of coughing. Still takes Nexium. She reports that her cough eased up a bit for a few days, but then went back to same daily cough, productive of clear mucous. Seems to be worse at night. She has some sinus pain. >>tx w/ claritin and nasal rinses and flonase   ROV 05/05/11 Acute OV  Pt presents for an acute office visit. Complains of persistent cough, congestion and drainage. Cough is keeping her up night.  Using no meds for  cough. Mucus is yellow and white. Mucus is very thick.  CT sinus done last ov was neg for acute changes.  Cough and congestion are getting worse.   ROV 05/12/11 -- 65 woman, former tobacco, mixed dz on Spirio, chronic cough with allergic rhinitis. She was seen last week with refractory cough, prod of tan mucous, treated for acute bronchitis and is now improved. She remains on loratadine + flonase + [place]. Also on nexium bid. Not on any inhaled medications. She is preparing to get cataract   ROV 07/11/11 -- COPD/mixed disease, chronic cough and allergies.  She has been coughing worse for over 2 weeks. More nasal congestion, more pharyngeal drainage, sore throat. Has been taking her fluticasone, loratadine, NSW's. She tells me that since last time she has been admitted to hospital for CHF exacerbation. No fevers, no purulent secretions. Still on nexium bid.      Objective:   Physical Exam GEN: A/Ox3; pleasant , NAD, well nourished   HEENT:  [place]/AT,  EACs-clear, TMs-wnl, NOSE-clear drainage , THROAT-clear, no lesions, no postnasal drip or exudate noted.   NECK:  Supple w/ fair ROM; no JVD; normal carotid impulses w/o bruits; no thyromegaly or nodules palpated; no lymphadenopathy.  RESP  Clear  P & A; w/o, wheezes/ rales/ or rhonchi.no accessory muscle use, no dullness to percussion  CARD:  RRR, no m/r/g  , no peripheral edema, pulses intact, no cyanosis or clubbing.  Musco: Warm bil, no deformities or joint swelling noted.  Neuro: alert, no focal deficits noted.    Skin: Warm, no lesions or rashes      Assessment & Plan:  COUGH Due largely to uncontrolled PND - will adjust allergy regimen and treat for possible bronchitis - azithro x 5 days - astepro substituted for fluticasone - loratadine - ventolin prn - rov 1 mon

## 2011-07-11 NOTE — Telephone Encounter (Signed)
Pt requesting an appt with RB and is scheduled for today at 3 pm.

## 2011-07-12 NOTE — H&P (Signed)
[name], [name]               ACCOUNT NO.:  [id number]  MEDICAL RECORD NO.:  [id number]  LOCATION:  [facility]                         FACILITY:  [facility]  PHYSICIAN:  Richarda Overlie, MD       DATE OF BIRTH:  1946/10/22  DATE OF ADMISSION:  06/15/2011 DATE OF DISCHARGE:                             HISTORY & PHYSICAL   PRIMARY CARE PHYSICIAN:  Dr. Michele Mcalpine.  PRIMARY NEPHROLOGIST:  Dr. Hyman Hopes.  CHIEF COMPLAINT:  Shortness of breath.  SUBJECTIVE:  This is a 65 year old female with a history of uncontrolled diabetes, chronic kidney disease stage IV who presented with chronic dyspnea on exertion with acute exacerbation over the last few days.  Per the daughter the patient had an episode of bronchitis 6 weeks ago which was treated with an antibiotic the name of which she cannot recall.  She has noticed worsening dyspnea on exertion limiting her physical activity.  She denies any bilateral lower extremity edema.  She does not have any current ongoing cough, congestion, sore throat.  She denies any fever, chills, or rigors.  She states that she has gained about 6-7 pounds in the last 2 months.  She also states that she has had very labile sugars at home and has been experiencing some hypoglycemic episodes.  She denies any chest pain today.  She does have significant orthopnea and paroxysmal nocturnal dyspnea.  PAST MEDICAL HISTORY:  History of diabetes insulin dependent, hypertension, dyslipidemia, remote history of tobacco abuse, morbid obesity, chronic kidney disease stage IV, stage V, status post Myoview in 2009 that was negative, status post echocardiogram in 2011 that showed an EF of 60% with mild LVH, mild MR, chronic diastolic heart failure, gastroesophageal reflux disease, depression, degenerative joint disease.  PAST SURGICAL HISTORY:  Status post left upper extremity AV fistula on July 06, 2010 as well as colonoscopy and polypectomy.  ALLERGIES:  CODEINE.  HOME MEDICATIONS:   Zemplar, Vytorin, vitamin D3, vitamin B12, Tums, tramadol, aspirin, Bystolic, Cardizem, Coreg, Celexa, chlorpheniramine, clonidine, Humalog, Lantus, Lasix, Lyrica, Nasacort, Nexium.  Kindly refer to the home medical reconciliation sheet for the exact dosages.  SOCIAL HISTORY:  She lives in [place] with her husband.  She is a retired Lawyer.  She has a 40-pack-year history of tobacco abuse, quit smoking in 2009, although her husband still smokes in the house.  She has no history of alcohol abuse or drug abuse.  FAMILY HISTORY:  Mother died in her 42s and her father died in his 40s, both have a history of heart disease and having MIs but no siblings have heart disease.  REVIEW OF SYSTEMS:  Complete 14 point review of systems was done as documented in HPI.  PHYSICAL EXAMINATION:  VITAL SIGNS:  Blood pressure 125/78, pulse of 55, respirations 18, temperature 98.3. GENERAL:  Comfortable in no acute cardiopulmonary distress. HEENT:  Pupils equal and reactive.  Extraocular movements intact. NECK:  Supple.  No JVD. LUNGS:  Clear to auscultation bilaterally.  No wheezes, crackles, or rhonchi. CARDIOVASCULAR:  Regular rate and rhythm.  No murmurs, rubs, or gallops. ABDOMEN:  Obese, soft, nontender, nondistended. EXTREMITIES:  Trace pedal edema, bilateral lower extremities. NEUROLOGIC:  Cranial nerves II-XII grossly intact.  PSYCHIATRIC:  Appropriate mood and affect. LYMPHATIC:  No axillary or cervical lymphadenopathy.  EKG shows normal sinus rhythm with a baseline rate of 57 beats per minute without any acute ST-T segment changes.  Chest x-ray shows cardiomegaly with mild interstitial edema.  Troponin less than 0.03. Pro BNP is 1742.  BMET:  Sodium 139, potassium 2.2, chloride 94, bicarb 34, glucose 64, BUN 87, creatinine 5.97, calcium 9.4. CBC:  WBC 8.9, hemoglobin 11.5, hematocrit 35.4, platelet count of 185,000.  ASSESSMENT/PLAN: 68. A 65 year old female with a history of shortness of  breath likely     secondary to acute on chronic diastolic heart failure with evidence     of mild volume overload based on a BNP and a chest x-ray and     clinical exam. 2. Chronic kidney disease stage IV, stage V. 3. Anemia of chronic disease. 4. Hypokalemia. 5. Hypertension. 6. Labile diabetes.  PLAN:  The patient will be admitted to telemetry.  We will cycle cardiac enzymes to make sure that she does not have any underlying unstable angina or acute coronary syndrome contributing to her shortness of breath.  The patient does not have any known history of coronary artery disease but she does have multiple risk factors for it.  The patient will also need aggressive diuresis for her acute on chronic diastolic heart failure.  Her last ejection fraction is mentioned as above.  We will repeat a 2-D echo.  We will start her on Lasix 60 IV b.i.d., monitor her GFR and her creatinine clearance closely.  Nephrology will be consulted in the morning.  There is no need for any urgent referral to  Nephrology tonight because the creatinine has not increased significantly compared to last year and there are no electrolyte  abnormalities. .We will start a trial of diuresis.  However, if the patient's diuretic response is inadequate or if the patient's symptoms have not resolved, the Nephrology does need to be consulted.  The patient will be continued on a Lantus at the lower dose and we will continue her on regular insulin sliding scale with before meals and at bedtime coverage.  The plan of care was discussed with the patient and her daughter by the bedside who are agreeable.  She is a full code.     Richarda Overlie, MD    NA/MEDQ  D:  06/15/2011  T:  06/15/2011  Job:  [id number]  Electronically Signed by Richarda Overlie MD on 07/12/2011 10:24:05 PM

## 2011-07-20 NOTE — Discharge Summary (Signed)
  NAMEVIEVA, [name]               ACCOUNT NO.:  [id number]  MEDICAL RECORD NO.:  [id number]  LOCATION:  4733                         FACILITY:  [facility]  PHYSICIAN:  Pleas Koch, MD        DATE OF BIRTH:  1946-02-13  DATE OF ADMISSION:  06/15/2011 DATE OF DISCHARGE:  06/17/2011                              DISCHARGE SUMMARY   ADDENDUM: To clarify that the patient was discontinued off her Lyrica (pregabalin) medication.          ______________________________ Pleas Koch, MD     JS/MEDQ  D:  07/18/2011  T:  07/18/2011  Job:  [id number]  Electronically Signed by Pleas Koch MD on 07/20/2011 04:34:57 PM

## 2011-07-21 ENCOUNTER — Encounter (HOSPITAL_COMMUNITY): Payer: Medicare Other

## 2011-07-22 ENCOUNTER — Other Ambulatory Visit: Payer: Self-pay | Admitting: Nephrology

## 2011-07-22 ENCOUNTER — Encounter (HOSPITAL_COMMUNITY): Payer: Medicare Other

## 2011-08-01 ENCOUNTER — Encounter (HOSPITAL_COMMUNITY): Payer: Medicare Other

## 2011-08-05 ENCOUNTER — Other Ambulatory Visit: Payer: Self-pay | Admitting: Nephrology

## 2011-08-05 ENCOUNTER — Encounter (HOSPITAL_COMMUNITY): Payer: Medicare Other | Attending: Nephrology

## 2011-08-05 DIAGNOSIS — N184 Chronic kidney disease, stage 4 (severe): Secondary | ICD-10-CM | POA: Insufficient documentation

## 2011-08-05 DIAGNOSIS — D638 Anemia in other chronic diseases classified elsewhere: Secondary | ICD-10-CM | POA: Insufficient documentation

## 2011-08-05 LAB — RENAL FUNCTION PANEL
Albumin: 3.4 g/dL — ABNORMAL LOW (ref 3.5–5.2)
Calcium: 10.1 mg/dL (ref 8.4–10.5)
GFR calc Af Amer: 10 mL/min — ABNORMAL LOW (ref >60)
Glucose, Bld: 48 mg/dL — ABNORMAL LOW (ref 70–99)
Phosphorus: 4.8 mg/dL — ABNORMAL HIGH (ref 2.3–4.6)
Potassium: 3.9 mEq/L (ref 3.5–5.1)
Sodium: 141 mEq/L (ref 135–145)

## 2011-08-05 LAB — IRON AND TIBC
Saturation Ratios: 20 % (ref 20–55)
UIBC: 228 ug/dL (ref 125–400)

## 2011-08-05 LAB — FERRITIN: Ferritin: 60 ng/mL (ref 10–291)

## 2011-08-08 ENCOUNTER — Telehealth: Payer: Self-pay | Admitting: Emergency Medicine

## 2011-08-08 NOTE — Telephone Encounter (Signed)
Pt is coming in to see RB tomorrow morning at 10:15

## 2011-08-09 ENCOUNTER — Ambulatory Visit (INDEPENDENT_AMBULATORY_CARE_PROVIDER_SITE_OTHER): Payer: Medicare Other | Admitting: Emergency Medicine

## 2011-08-09 ENCOUNTER — Encounter: Payer: Self-pay | Admitting: Emergency Medicine

## 2011-08-09 VITALS — BP 112/58 | HR 58 | Temp 98.5°F | Wt 218.4 lb

## 2011-08-09 DIAGNOSIS — R05 Cough: Secondary | ICD-10-CM

## 2011-08-19 ENCOUNTER — Encounter (HOSPITAL_COMMUNITY): Payer: Medicare Other

## 2011-08-19 ENCOUNTER — Ambulatory Visit: Payer: Medicare Other | Admitting: Emergency Medicine

## 2011-08-23 ENCOUNTER — Encounter: Payer: Self-pay | Admitting: Cardiology

## 2011-08-23 ENCOUNTER — Telehealth: Payer: Self-pay | Admitting: Emergency Medicine

## 2011-08-23 ENCOUNTER — Ambulatory Visit (INDEPENDENT_AMBULATORY_CARE_PROVIDER_SITE_OTHER): Payer: Medicare Other | Admitting: Cardiology

## 2011-08-23 ENCOUNTER — Encounter: Payer: Self-pay | Admitting: Emergency Medicine

## 2011-08-23 VITALS — BP 124/60 | HR 58 | Resp 18 | Ht 65.0 in | Wt 212.4 lb

## 2011-08-23 DIAGNOSIS — E663 Overweight: Secondary | ICD-10-CM

## 2011-08-23 DIAGNOSIS — I10 Essential (primary) hypertension: Secondary | ICD-10-CM

## 2011-08-23 DIAGNOSIS — I5032 Chronic diastolic (congestive) heart failure: Secondary | ICD-10-CM

## 2011-08-23 DIAGNOSIS — N259 Disorder resulting from impaired renal tubular function, unspecified: Secondary | ICD-10-CM

## 2011-08-23 DIAGNOSIS — I498 Other specified cardiac arrhythmias: Secondary | ICD-10-CM

## 2011-08-23 DIAGNOSIS — I509 Heart failure, unspecified: Secondary | ICD-10-CM

## 2011-08-23 LAB — CBC
HCT: 35.9 — ABNORMAL LOW
Hemoglobin: 11.9 — ABNORMAL LOW
MCHC: 33.1
MCV: 92.7
Platelets: 226
Platelets: 254
RDW: 17 — ABNORMAL HIGH
WBC: 6.6

## 2011-08-23 LAB — GLUCOSE, CAPILLARY
Glucose-Capillary: 113 — ABNORMAL HIGH
Glucose-Capillary: 114 — ABNORMAL HIGH
Glucose-Capillary: 128 — ABNORMAL HIGH
Glucose-Capillary: 129 — ABNORMAL HIGH
Glucose-Capillary: 135 — ABNORMAL HIGH
Glucose-Capillary: 145 — ABNORMAL HIGH
Glucose-Capillary: 152 — ABNORMAL HIGH
Glucose-Capillary: 185 — ABNORMAL HIGH
Glucose-Capillary: 186 — ABNORMAL HIGH
Glucose-Capillary: 195 — ABNORMAL HIGH
Glucose-Capillary: 42 — ABNORMAL LOW
Glucose-Capillary: 42 — ABNORMAL LOW
Glucose-Capillary: 85
Glucose-Capillary: 90
Glucose-Capillary: 93
Glucose-Capillary: 95

## 2011-08-23 LAB — COMPREHENSIVE METABOLIC PANEL
ALT: 15
AST: 17
Albumin: 2.6 — ABNORMAL LOW
Alkaline Phosphatase: 47
CO2: 30
Calcium: 8.9
Chloride: 105
Chloride: 106
Creatinine, Ser: 1.58 — ABNORMAL HIGH
GFR calc non Af Amer: 33 — ABNORMAL LOW
Glucose, Bld: 108 — ABNORMAL HIGH
Potassium: 3.7
Sodium: 143
Total Bilirubin: 0.4
Total Bilirubin: 0.5
Total Protein: 5.8 — ABNORMAL LOW

## 2011-08-23 LAB — BASIC METABOLIC PANEL
Chloride: 108
GFR calc non Af Amer: 36 — ABNORMAL LOW
Glucose, Bld: 74
Potassium: 3.6
Sodium: 140

## 2011-08-23 LAB — CK TOTAL AND CKMB (NOT AT ARMC)
Relative Index: 1.4
Total CK: 120
Total CK: 156
Total CK: 188 — ABNORMAL HIGH
Total CK: 197 — ABNORMAL HIGH

## 2011-08-23 LAB — TROPONIN I
Troponin I: 0.01
Troponin I: 0.02
Troponin I: 0.02

## 2011-08-23 LAB — POCT CARDIAC MARKERS
Myoglobin, poc: 134
Troponin i, poc: <0.05

## 2011-08-23 LAB — PROTIME-INR
INR: 1
Prothrombin Time: 12.8

## 2011-08-23 LAB — B-NATRIURETIC PEPTIDE (CONVERTED LAB): Pro B Natriuretic peptide (BNP): 174 — ABNORMAL HIGH

## 2011-08-23 LAB — POCT I-STAT, CHEM 8
Calcium, Ion: 1.12
Chloride: 109
HCT: 38
Hemoglobin: 12.9
Potassium: 3.7

## 2011-08-23 LAB — APTT: aPTT: 31

## 2011-08-23 LAB — TSH: TSH: 1.441

## 2011-08-23 MED ORDER — ALBUTEROL SULFATE HFA 108 (90 BASE) MCG/ACT IN AERS: 2.0000 | INHALATION_SPRAY | Freq: Four times a day (QID) | RESPIRATORY_TRACT | Status: DC | PRN

## 2011-08-23 NOTE — Progress Notes (Signed)
Subjective:    Patient ID: [name], female    DOB: [birth year], 65 y.o.   MRN: [id number]  HPI 7 yobf quit smoking after hospitalization [facility] 2009, mixed dz by PFT, chronic cough.   June 09, 2010 ov  c/o worsening cough over the past few months.  Cough mainly dry- occ produces minimal clear to yellow sputum.  Pt states that she coughs "all day and night"- gets SOB when has coughing spells- esp at night w/in 10 min of lying down but minimal actual sputum production and no premature awakening or am exacerbation.  Pt denies any significant sore throat, dysphagia, itching, sneezing,  nasal congestion or excess secretions,  fever, chills, sweats, unintended wt loss, pleuritic or exertional cp, hempoptysis, change in activity tolerance  orthopnea pnd or leg swelling   ROV 07/22/10 -- 65 yo woman. Hx mild COPD, CHF, chronic cough. Presented last time with refractory cough - Dr Sherene Sires held Spiriva, fexofenadine, atenolol. New b-b;locker per cards is coreg. Drainage much improved with chlorphenerimine. Cough is much improved. She doesn't miss the Spiriva. She was started on supplimental O2 during hosp admission last week.   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 Has been taking OTC decongestants without much relief of congestion, sore throat, cough. Started taking loratadine last week. Has been doing nasal saline washes prn.   ROV 04/20/11 - Acute OV, mixed disease, chronic cough. Last time we treated allergies - NSW, nasal steroid, loratadine for flare of coughing. Still takes Nexium. She reports that her cough eased up a bit for a few days, but then went back to same daily cough, productive of clear mucous. Seems to be worse at night. She has some sinus pain. >>tx w/ claritin and nasal rinses and flonase   ROV 05/05/11 Acute OV  Pt presents for an acute office visit. Complains of persistent cough, congestion and drainage. Cough is keeping her up night.  Using no meds for  cough. Mucus is yellow and white. Mucus is very thick.  CT sinus done last ov was neg for acute changes.  Cough and congestion are getting worse.   ROV 05/12/11 -- 65 woman, former tobacco, mixed dz on Spirio, chronic cough with allergic rhinitis. She was seen last week with refractory cough, prod of tan mucous, treated for acute bronchitis and is now improved. She remains on loratadine + flonase + [place]. Also on nexium bid. Not on any inhaled medications. She is preparing to get cataract   ROV 07/11/11 -- COPD/mixed disease, chronic cough and allergies.  She has been coughing worse for over 2 weeks. More nasal congestion, more pharyngeal drainage, sore throat. Has been taking her fluticasone, loratadine, NSW's. She tells me that since last time she has been admitted to hospital for CHF exacerbation. No fevers, no purulent secretions. Still on nexium bid.   ROV 08/23/11 -- COPD, coexisting restriction, chronic cough, allergies. Last time changed fluticasone to astopro, continued NSW + loratadine = nexium. Also rx with azithro x 5 days. Still w PND     Objective:   Physical Exam GEN: A/Ox3; pleasant , NAD, well nourished   HEENT:  [place]/AT,  EACs-clear, TMs-wnl, NOSE-clear drainage , THROAT-clear, no lesions, no postnasal drip or exudate noted. Hoarse voice  NECK:  Supple w/ fair ROM; no JVD; normal carotid impulses w/o bruits; no thyromegaly or nodules palpated; no lymphadenopathy.  RESP  Clear  P & A; w/o, wheezes/ rales/ or rhonchi.no accessory muscle use,  no dullness to percussion  CARD:  RRR, no m/r/g  , no peripheral edema, pulses intact, no cyanosis or clubbing.  Musco: Warm bil, no deformities or joint swelling noted.   Neuro: alert, no focal deficits noted.    Skin: Warm, no lesions or rashes      Assessment & Plan:  COUGH - change back to fluticasone bid + loratadine + NSW - continue nexium - prn tussionex - OK for cataract surgery - ROV 6 months

## 2011-08-23 NOTE — Assessment & Plan Note (Signed)
She is having no symptoms related to this.  No change in therapy is indicated.

## 2011-08-23 NOTE — Telephone Encounter (Signed)
Due to insurance coverage, pt's Ventolin HFA needs to be changed to Proair HFA. Dr. Delton Coombes is aware and this change has been made. New Rx sent to pt's pharmacy.

## 2011-08-23 NOTE — Progress Notes (Signed)
HPI The patient presents for one year follow up.  Since I last saw her she was hospitalized for volume overload.  I reviewed these hospital records. She had no change in her echocardiogram. Since that time she has done well. She did have a cough which was treated by Dr. Delton Coombes and is improved.  The patient denies any new symptoms such as chest discomfort, neck or arm discomfort. There has been no new shortness of breath, PND or orthopnea. There have been no reported palpitations, presyncope or syncope.   Allergies  Allergen Reactions  . Codeine     Current Outpatient Prescriptions  Medication Sig Dispense Refill  . albuterol (VENTOLIN HFA) 108 (90 BASE) MCG/ACT inhaler Inhale 2 puffs into the lungs every 4 (four) hours as needed.  1 Inhaler  5  . aspirin 81 MG tablet Take 81 mg by mouth daily.        Marland Kitchen atorvastatin (LIPITOR) 40 MG tablet Take 1 tablet by mouth daily.      Marland Kitchen azelastine (ASTEPRO) 137 MCG/SPRAY nasal spray Place 1 spray into the nose 2 (two) times daily. Use in each nostril as directed  30 mL  12  . B-D ULTRAFINE III SHORT PEN 31G X 8 MM MISC As directed      . calcium acetate (PHOSLO) 667 MG capsule Take 1 tablet by mouth Daily.      . calcium carbonate (TUMS - DOSED IN MG ELEMENTAL CALCIUM) 500 MG chewable tablet Chew 1 tablet by mouth daily.        . carvedilol (COREG) 6.25 MG tablet Take 6.25 mg by mouth 2 (two) times daily with a meal.        . Cholecalciferol (VITAMIN D3) 2000 UNITS TABS Take 1 tablet by mouth daily.        . citalopram (CELEXA) 40 MG tablet Take 1 tablet by mouth daily.      . cloNIDine (CATAPRES) 0.3 MG tablet Take 1/2 two times a day       . diltiazem (TIAZAC) 360 MG 24 hr capsule Take 1 tablet by mouth Daily.       Marland Kitchen esomeprazole (NEXIUM) 40 MG capsule Take 40 mg by mouth as needed. 30-60 minutes before the first and last meal of the day      . famotidine (PEPCID) 20 MG tablet Take 20 mg by mouth at bedtime as needed.        . furosemide (LASIX) 80 MG  tablet Take 80 mg by mouth 2 (two) times daily.        . insulin glargine (LANTUS) 100 UNIT/ML injection Inject into the skin. 60 units every morning      . insulin lispro (HUMALOG) 100 UNIT/ML injection Inject into the skin. Sliding scale       . metolazone (ZAROXOLYN) 5 MG tablet Take 5 mg by mouth as needed.       . paricalcitol (ZEMPLAR) 1 MCG capsule 1 mcg. 3 times weekly       . potassium chloride SA (K-DUR,KLOR-CON) 20 MEQ tablet 2 twice daily      . pregabalin (LYRICA) 50 MG capsule Take 50 mg by mouth daily.       . traMADol (ULTRAM) 50 MG tablet Take 50 mg by mouth every 4 (four) hours as needed.        . vitamin B-12 (CYANOCOBALAMIN) 1000 MCG tablet Take 1,000 mcg by mouth daily.        Marland Kitchen VYTORIN 10-80 MG per tablet Take  1 tablet by mouth Daily.        Past Medical History  Diagnosis Date  . Type II or unspecified type diabetes mellitus without mention of complication, not stated as uncontrolled   . Esophageal reflux   . DJD (degenerative joint disease)   . Depressive disorder, not elsewhere classified   . Unspecified essential hypertension   . Chronic cough   . Renal insufficiency   . CHF (congestive heart failure)     with well preserved EF   . HTN (hypertension)   . HLD (hyperlipidemia)     Past Surgical History  Procedure Date  . Insertion of dialysis catheter     ROS:  As stated in the HPI and negative for all other systems.  PHYSICAL EXAM BP 124/60  Pulse 58  Resp 18  Ht 5\' 5"  (1.651 m)  Wt 212 lb 6.4 oz (96.344 kg)  BMI 35.35 kg/m2 GENERAL:  Well appearing HEENT:  Pupils equal round and reactive, fundi not visualized, oral mucosa unremarkable NECK:  No jugular venous distention, waveform within normal limits, carotid upstroke brisk and symmetric, no bruits, no thyromegaly LYMPHATICS:  No cervical, inguinal adenopathy LUNGS:  Clear to auscultation bilaterally BACK:  No CVA tenderness CHEST:  Unremarkable, LUSB holosystolic machine like murmur radiating  to the left carotid. HEART:  PMI not displaced or sustained,S1 and S2 within normal limits, no S3, no S4, no clicks, no rubs, no murmurs ABD:  Flat, positive bowel sounds normal in frequency in pitch, no bruits, no rebound, no guarding, no midline pulsatile mass, no hepatomegaly, no splenomegaly EXT:  2 plus pulses throughout, no edema, no cyanosis no clubbing, left upper arm fistula SKIN:  No rashes no nodules NEURO:  Cranial nerves II through XII grossly intact, motor grossly intact throughout PSYCH:  Cognitively intact, oriented to person place and time   EKG:  Sinus rhythm, rate 58, axis within normal limits, intervals within normal limits, no acute ST-T wave changes, LVH   ASSESSMENT AND PLAN

## 2011-08-23 NOTE — Assessment & Plan Note (Signed)
I reviewed the labs.  She follows closely with Dr. Hyman Hopes.

## 2011-08-23 NOTE — Assessment & Plan Note (Signed)
The patient understands the need to lose weight with diet and exercise. We have discussed specific strategies for this.  

## 2011-08-23 NOTE — Assessment & Plan Note (Signed)
She seems to be euvolemic.  At this point, no change in therapy is indicated.  We have reviewed salt and fluid restrictions.  No further cardiovascular testing is indicated.   

## 2011-08-23 NOTE — Patient Instructions (Signed)
The current medical regimen is effective;  continue present plan and medications.  Follow up in 1 year with Dr Hochrein.  You will receive a letter in the mail 2 months before you are due.  Please call us when you receive this letter to schedule your follow up appointment.  

## 2011-08-23 NOTE — Assessment & Plan Note (Signed)
-   change back to fluticasone bid + loratadine + [place] - continue nexium - prn tussionex - OK for cataract surgery - ROV 6 months

## 2011-08-23 NOTE — Assessment & Plan Note (Signed)
The blood pressure is at target. No change in medications is indicated. We will continue with therapeutic lifestyle changes (TLC).  

## 2011-09-01 ENCOUNTER — Other Ambulatory Visit: Payer: Self-pay | Admitting: Nephrology

## 2011-09-01 ENCOUNTER — Encounter (HOSPITAL_COMMUNITY)
Admission: RE | Admit: 2011-09-01 | Discharge: 2011-09-01 | Disposition: A | Discharge status: Home / Self Care | Payer: Medicare Other | Source: Ambulatory Visit | Attending: Nephrology | Admitting: Nephrology

## 2011-09-01 DIAGNOSIS — D638 Anemia in other chronic diseases classified elsewhere: Secondary | ICD-10-CM | POA: Insufficient documentation

## 2011-09-01 DIAGNOSIS — N184 Chronic kidney disease, stage 4 (severe): Secondary | ICD-10-CM | POA: Insufficient documentation

## 2011-09-01 LAB — RENAL FUNCTION PANEL
BUN: 90 mg/dL — ABNORMAL HIGH (ref 6–23)
CO2: 30 mEq/L (ref 19–32)
Calcium: 9.4 mg/dL (ref 8.4–10.5)
Creatinine, Ser: 5.5 mg/dL — ABNORMAL HIGH (ref 0.50–1.10)
GFR calc Af Amer: 9 mL/min — ABNORMAL LOW (ref >90)
Glucose, Bld: 81 mg/dL (ref 70–99)
Sodium: 140 mEq/L (ref 135–145)

## 2011-09-01 LAB — FERRITIN: Ferritin: 77 ng/mL (ref 10–291)

## 2011-09-01 LAB — IRON AND TIBC
Saturation Ratios: 21 % (ref 20–55)
UIBC: 239 ug/dL (ref 125–400)

## 2011-09-15 ENCOUNTER — Encounter (HOSPITAL_COMMUNITY): Payer: Medicare Other

## 2011-09-15 ENCOUNTER — Other Ambulatory Visit: Payer: Self-pay | Admitting: Nephrology

## 2011-09-22 ENCOUNTER — Other Ambulatory Visit (HOSPITAL_COMMUNITY): Payer: Self-pay | Admitting: *Deleted

## 2011-09-29 ENCOUNTER — Encounter (HOSPITAL_COMMUNITY)
Admission: RE | Admit: 2011-09-29 | Discharge: 2011-09-29 | Disposition: A | Discharge status: Home / Self Care | Payer: Medicare Other | Source: Ambulatory Visit | Attending: Nephrology | Admitting: Nephrology

## 2011-09-29 DIAGNOSIS — N184 Chronic kidney disease, stage 4 (severe): Secondary | ICD-10-CM | POA: Insufficient documentation

## 2011-09-29 DIAGNOSIS — D638 Anemia in other chronic diseases classified elsewhere: Secondary | ICD-10-CM | POA: Insufficient documentation

## 2011-09-29 LAB — RENAL FUNCTION PANEL
Albumin: 3.3 g/dL — ABNORMAL LOW (ref 3.5–5.2)
BUN: 82 mg/dL — ABNORMAL HIGH (ref 6–23)
Chloride: 99 mEq/L (ref 96–112)
GFR calc non Af Amer: 11 mL/min — ABNORMAL LOW (ref >90)
Phosphorus: 4.3 mg/dL (ref 2.3–4.6)
Potassium: 4.6 mEq/L (ref 3.5–5.1)
Sodium: 138 mEq/L (ref 135–145)

## 2011-09-29 LAB — IRON AND TIBC
Iron: 66 ug/dL (ref 42–135)
Saturation Ratios: 21 % (ref 20–55)
TIBC: 314 ug/dL (ref 250–470)
UIBC: 248 ug/dL (ref 125–400)

## 2011-09-29 LAB — POCT HEMOGLOBIN-HEMACUE: Hemoglobin: 11.9 g/dL — ABNORMAL LOW (ref 12.0–15.0)

## 2011-09-29 LAB — FERRITIN: Ferritin: 53 ng/mL (ref 10–291)

## 2011-09-29 MED ORDER — DARBEPOETIN ALFA-POLYSORBATE 100 MCG/0.5ML IJ SOLN
INTRAMUSCULAR | Status: AC
  Administered 2011-09-29: 09:00:00 via SUBCUTANEOUS
  Filled 2011-09-29: qty 0.5

## 2011-09-29 MED ORDER — DARBEPOETIN ALFA-POLYSORBATE 500 MCG/ML IJ SOLN
100.0000 ug | INTRAMUSCULAR | Status: DC
Start: 2011-09-29 — End: 2011-09-30
  Filled 2011-09-29: qty 1

## 2011-10-14 ENCOUNTER — Encounter (HOSPITAL_COMMUNITY)
Admission: RE | Admit: 2011-10-14 | Discharge: 2011-10-14 | Disposition: A | Discharge status: Home / Self Care | Payer: Medicare Other | Source: Ambulatory Visit | Attending: Nephrology | Admitting: Nephrology

## 2011-10-14 LAB — POCT HEMOGLOBIN-HEMACUE: Hemoglobin: 11.3 g/dL — ABNORMAL LOW (ref 12.0–15.0)

## 2011-10-14 MED ORDER — DARBEPOETIN ALFA-POLYSORBATE 500 MCG/ML IJ SOLN: 100.0000 ug | INTRAMUSCULAR | Status: DC

## 2011-10-14 MED ORDER — DARBEPOETIN ALFA-POLYSORBATE 100 MCG/0.5ML IJ SOLN
INTRAMUSCULAR | Status: AC
  Administered 2011-10-14: 100 ug
  Filled 2011-10-14: qty 0.5

## 2011-10-16 ENCOUNTER — Other Ambulatory Visit: Payer: Self-pay | Admitting: Cardiology

## 2011-10-28 ENCOUNTER — Encounter (HOSPITAL_COMMUNITY)
Admission: RE | Admit: 2011-10-28 | Discharge: 2011-10-28 | Disposition: A | Discharge status: Home / Self Care | Payer: Medicare Other | Source: Ambulatory Visit | Attending: Nephrology | Admitting: Nephrology

## 2011-10-28 DIAGNOSIS — N184 Chronic kidney disease, stage 4 (severe): Secondary | ICD-10-CM | POA: Insufficient documentation

## 2011-10-28 DIAGNOSIS — D638 Anemia in other chronic diseases classified elsewhere: Secondary | ICD-10-CM | POA: Insufficient documentation

## 2011-10-28 LAB — RENAL FUNCTION PANEL
Albumin: 3.2 g/dL — ABNORMAL LOW (ref 3.5–5.2)
GFR calc Af Amer: 13 mL/min — ABNORMAL LOW (ref >90)
GFR calc non Af Amer: 11 mL/min — ABNORMAL LOW (ref >90)
Glucose, Bld: 58 mg/dL — ABNORMAL LOW (ref 70–99)
Phosphorus: 4.2 mg/dL (ref 2.3–4.6)
Potassium: 3.7 mEq/L (ref 3.5–5.1)
Sodium: 138 mEq/L (ref 135–145)

## 2011-10-28 LAB — IRON AND TIBC
Iron: 51 ug/dL (ref 42–135)
UIBC: 231 ug/dL (ref 125–400)

## 2011-10-28 MED ORDER — DARBEPOETIN ALFA-POLYSORBATE 100 MCG/0.5ML IJ SOLN
INTRAMUSCULAR | Status: AC
  Administered 2011-10-28: 100 ug
  Filled 2011-10-28: qty 0.5

## 2011-10-28 MED ORDER — DARBEPOETIN ALFA-POLYSORBATE 500 MCG/ML IJ SOLN
100.0000 ug | INTRAMUSCULAR | Status: DC
  Filled 2011-10-28: qty 1

## 2011-11-11 ENCOUNTER — Encounter (HOSPITAL_COMMUNITY)
Admission: RE | Admit: 2011-11-11 | Discharge: 2011-11-11 | Disposition: A | Discharge status: Home / Self Care | Payer: Medicare Other | Source: Ambulatory Visit | Attending: Nephrology | Admitting: Nephrology

## 2011-11-11 MED ORDER — DARBEPOETIN ALFA-POLYSORBATE 500 MCG/ML IJ SOLN
100.0000 ug | INTRAMUSCULAR | Status: DC
  Administered 2011-11-11: 100 ug via SUBCUTANEOUS

## 2011-11-11 MED ORDER — DARBEPOETIN ALFA-POLYSORBATE 100 MCG/0.5ML IJ SOLN
INTRAMUSCULAR | Status: AC
  Filled 2011-11-11: qty 0.5

## 2011-11-14 MED FILL — Darbepoetin Alfa-Polysorbate 80 Soln Inj 100 MCG/0.5ML: INTRAMUSCULAR | Qty: 0.5 | Status: AC

## 2011-11-25 ENCOUNTER — Encounter (HOSPITAL_COMMUNITY)
Admission: RE | Admit: 2011-11-25 | Discharge: 2011-11-25 | Disposition: A | Discharge status: Home / Self Care | Payer: Medicare Other | Source: Ambulatory Visit | Attending: Nephrology | Admitting: Nephrology

## 2011-11-25 ENCOUNTER — Other Ambulatory Visit (HOSPITAL_COMMUNITY): Payer: Self-pay | Admitting: *Deleted

## 2011-11-25 DIAGNOSIS — D638 Anemia in other chronic diseases classified elsewhere: Secondary | ICD-10-CM | POA: Insufficient documentation

## 2011-11-25 DIAGNOSIS — N184 Chronic kidney disease, stage 4 (severe): Secondary | ICD-10-CM | POA: Insufficient documentation

## 2011-11-25 LAB — RENAL FUNCTION PANEL
BUN: 57 mg/dL — ABNORMAL HIGH (ref 6–23)
Calcium: 9.6 mg/dL (ref 8.4–10.5)
Creatinine, Ser: 3.96 mg/dL — ABNORMAL HIGH (ref 0.50–1.10)
Glucose, Bld: 188 mg/dL — ABNORMAL HIGH (ref 70–99)
Phosphorus: 4.2 mg/dL (ref 2.3–4.6)

## 2011-11-25 LAB — FERRITIN: Ferritin: 54 ng/mL (ref 10–291)

## 2011-11-25 LAB — IRON AND TIBC: UIBC: 251 ug/dL (ref 125–400)

## 2011-11-25 MED ORDER — DARBEPOETIN ALFA-POLYSORBATE 500 MCG/ML IJ SOLN
100.0000 ug | INTRAMUSCULAR | Status: DC
  Filled 2011-11-25: qty 1

## 2011-11-25 MED ORDER — DARBEPOETIN ALFA-POLYSORBATE 100 MCG/0.5ML IJ SOLN
INTRAMUSCULAR | Status: AC
  Administered 2011-11-25: 100 ug via SUBCUTANEOUS
  Filled 2011-11-25: qty 0.5

## 2011-12-09 ENCOUNTER — Encounter (HOSPITAL_COMMUNITY): Payer: Self-pay | Admitting: *Deleted

## 2011-12-09 ENCOUNTER — Emergency Department (HOSPITAL_COMMUNITY): Payer: Medicare Other

## 2011-12-09 ENCOUNTER — Emergency Department (HOSPITAL_COMMUNITY)
Admission: EM | Admit: 2011-12-09 | Discharge: 2011-12-10 | Disposition: A | Discharge status: Home / Self Care | Payer: Medicare Other | Attending: Emergency Medicine | Admitting: Emergency Medicine

## 2011-12-09 ENCOUNTER — Encounter (HOSPITAL_COMMUNITY): Payer: Medicare Other

## 2011-12-09 DIAGNOSIS — Z79899 Other long term (current) drug therapy: Secondary | ICD-10-CM | POA: Insufficient documentation

## 2011-12-09 DIAGNOSIS — K219 Gastro-esophageal reflux disease without esophagitis: Secondary | ICD-10-CM | POA: Insufficient documentation

## 2011-12-09 DIAGNOSIS — N189 Chronic kidney disease, unspecified: Secondary | ICD-10-CM

## 2011-12-09 DIAGNOSIS — R079 Chest pain, unspecified: Secondary | ICD-10-CM | POA: Insufficient documentation

## 2011-12-09 DIAGNOSIS — R0602 Shortness of breath: Secondary | ICD-10-CM | POA: Insufficient documentation

## 2011-12-09 DIAGNOSIS — Z794 Long term (current) use of insulin: Secondary | ICD-10-CM | POA: Insufficient documentation

## 2011-12-09 DIAGNOSIS — E785 Hyperlipidemia, unspecified: Secondary | ICD-10-CM | POA: Insufficient documentation

## 2011-12-09 DIAGNOSIS — F329 Major depressive disorder, single episode, unspecified: Secondary | ICD-10-CM | POA: Insufficient documentation

## 2011-12-09 DIAGNOSIS — I129 Hypertensive chronic kidney disease with stage 1 through stage 4 chronic kidney disease, or unspecified chronic kidney disease: Secondary | ICD-10-CM | POA: Insufficient documentation

## 2011-12-09 DIAGNOSIS — Z7982 Long term (current) use of aspirin: Secondary | ICD-10-CM | POA: Insufficient documentation

## 2011-12-09 DIAGNOSIS — M109 Gout, unspecified: Secondary | ICD-10-CM | POA: Insufficient documentation

## 2011-12-09 DIAGNOSIS — I509 Heart failure, unspecified: Secondary | ICD-10-CM

## 2011-12-09 DIAGNOSIS — R5383 Other fatigue: Secondary | ICD-10-CM | POA: Insufficient documentation

## 2011-12-09 DIAGNOSIS — R5381 Other malaise: Secondary | ICD-10-CM | POA: Insufficient documentation

## 2011-12-09 DIAGNOSIS — M7989 Other specified soft tissue disorders: Secondary | ICD-10-CM | POA: Insufficient documentation

## 2011-12-09 DIAGNOSIS — M79609 Pain in unspecified limb: Secondary | ICD-10-CM | POA: Insufficient documentation

## 2011-12-09 DIAGNOSIS — E119 Type 2 diabetes mellitus without complications: Secondary | ICD-10-CM | POA: Insufficient documentation

## 2011-12-09 DIAGNOSIS — F3289 Other specified depressive episodes: Secondary | ICD-10-CM | POA: Insufficient documentation

## 2011-12-09 LAB — POCT I-STAT, CHEM 8
Calcium, Ion: 1.19 mmol/L (ref 1.12–1.32)
Creatinine, Ser: 3.9 mg/dL — ABNORMAL HIGH (ref 0.50–1.10)
Hemoglobin: 11.2 g/dL — ABNORMAL LOW (ref 12.0–15.0)
Sodium: 141 mEq/L (ref 135–145)
TCO2: 25 mmol/L (ref 0–100)

## 2011-12-09 LAB — CARDIAC PANEL(CRET KIN+CKTOT+MB+TROPI)
CK, MB: 3.9 ng/mL (ref 0.3–4.0)
Relative Index: 3.4 — ABNORMAL HIGH (ref 0.0–2.5)
Total CK: 116 U/L (ref 7–177)

## 2011-12-09 LAB — CBC
Platelets: 177 10*3/uL (ref 150–400)
RBC: 3.56 MIL/uL — ABNORMAL LOW (ref 3.87–5.11)
WBC: 8.7 10*3/uL (ref 4.0–10.5)

## 2011-12-09 LAB — DIFFERENTIAL
Basophils Absolute: 0 10*3/uL (ref 0.0–0.1)
Eosinophils Absolute: 0.1 10*3/uL (ref 0.0–0.7)
Lymphocytes Relative: 17 % (ref 12–46)
Lymphs Abs: 1.5 10*3/uL (ref 0.7–4.0)
Neutrophils Relative %: 75 % (ref 43–77)

## 2011-12-09 NOTE — ED Provider Notes (Signed)
History     CSN: [id number]  Arrival date & time 12/09/11  2136   None     Chief Complaint  Patient presents with  . Fatigue    (Consider location/radiation/quality/duration/timing/severity/associated sxs/prior treatment) HPI Comments: Patient has had to use her home oxygen more frequently or consistently for the past week.  Blood sugars have been quite labile through the week.  Today she noticed excruciating pain and swelling of her right foot without injury also having chest pain that radiates to the back, increased with exertion.  She is predialysis.  She gets every two-week injections for her hemoglobin, which she was due for today, but missed due to the weather.  Now presenting to the emergency room with increased fatigue that been gradually increasing over the last week  The history is provided by the patient.    Past Medical History  Diagnosis Date  . Type II or unspecified type diabetes mellitus without mention of complication, not stated as uncontrolled   . Esophageal reflux   . DJD (degenerative joint disease)   . Depressive disorder, not elsewhere classified   . Unspecified essential hypertension   . Chronic cough   . Renal insufficiency   . CHF (congestive heart failure)     with well preserved EF   . HTN (hypertension)   . HLD (hyperlipidemia)     Past Surgical History  Procedure Date  . Insertion of dialysis catheter     Family History  Problem Relation Age of Onset  . Heart disease Father   . Cancer Sister   . Hypertension    . Diabetes      History  Substance Use Topics  . Smoking status: Former Smoker -- 1.0 packs/day for 30 years    Types: Cigarettes    Quit date: 11/22/2007  . Smokeless tobacco: Not on file   Comment: 1ppd x 30 years  . Alcohol Use: No    OB History    Grav Para Term Preterm Abortions TAB SAB Ect Mult Living                  Review of Systems  Allergies  Codeine  Home Medications   Current Outpatient Rx  Name  Route Sig Dispense Refill  . ALBUTEROL SULFATE HFA 108 (90 BASE) MCG/ACT IN AERS Inhalation Inhale 2 puffs into the lungs every 6 (six) hours as needed. For wheezing    . ASPIRIN 81 MG PO TABS Oral Take 81 mg by mouth daily.      . ATORVASTATIN CALCIUM 40 MG PO TABS Oral Take 1 tablet by mouth every morning.     Marland Kitchen CALCIUM ACETATE 667 MG PO CAPS Oral Take 1 tablet by mouth 3 (three) times daily with meals.     Marland Kitchen CARVEDILOL 6.25 MG PO TABS Oral Take 6.25 mg by mouth 2 (two) times daily with a meal.      . VITAMIN D3 5000 UNITS PO CAPS Oral Take 2 capsules by mouth daily.    Marland Kitchen CITALOPRAM HYDROBROMIDE 40 MG PO TABS Oral Take 1 tablet by mouth daily.    Marland Kitchen CLONIDINE HCL 0.3 MG PO TABS Oral Take 0.3 mg by mouth 2 (two) times daily. Take 1/2 two times a day    . DILTIAZEM HCL ER BEADS 360 MG PO CP24 Oral Take 1 tablet by mouth every morning.     . FUROSEMIDE 80 MG PO TABS Oral Take 160 mg by mouth 2 (two) times daily.     Marland Kitchen  INSULIN GLARGINE 100 UNIT/ML [place] SOLN Subcutaneous Inject into the skin. 60 units every morning    . INSULIN LISPRO (HUMAN) 100 UNIT/ML [place] SOLN Subcutaneous Inject 14-26 Units into the skin 3 (three) times daily. Sliding scale as directed: 70-150=14 units, 151-200=17 units, 210-250=20 units, 251-300=23 units & >300=26 units    . LORATADINE 10 MG PO TABS Oral Take 10 mg by mouth daily.    Marland Kitchen MAGNESIUM 200 MG PO TABS Oral Take 1 tablet by mouth daily.    Marland Kitchen METOLAZONE 5 MG PO TABS Oral Take 5 mg by mouth every Monday, Wednesday, and Friday.     Marland Kitchen PARICALCITOL 1 MCG PO CAPS Oral Take 1 mcg by mouth every Monday, Wednesday, and Friday.     Marland Kitchen POTASSIUM CHLORIDE CRYS ER 20 MEQ PO TBCR Oral Take 40 mEq by mouth 2 (two) times daily. 2 twice daily    . PREGABALIN 50 MG PO CAPS Oral Take 50 mg by mouth at bedtime.     . TRAMADOL HCL 50 MG PO TABS Oral Take 50 mg by mouth every 4 (four) hours as needed. For pain    . TRIAMCINOLONE ACETONIDE 55 MCG/ACT NA INHA Nasal Place 2 sprays into the nose  daily as needed. For nasal congestion    . VITAMIN B-12 1000 MCG PO TABS Oral Take 1,000 mcg by mouth at bedtime.       BP 159/55  Pulse 60  Temp(Src) 98.8 F (37.1 C) (Oral)  Resp 18  SpO2 97%  Physical Exam  ED Course  Procedures (including critical care time)   Labs Reviewed  CBC  DIFFERENTIAL  I-STAT, CHEM 8  CARDIAC PANEL(CRET KIN+CKTOT+MB+TROPI)   No results found.   No diagnosis found.    MDM  Right foot swelling, short of breath, chest pain        Arman Filter, NP 12/09/11 2237

## 2011-12-09 NOTE — ED Notes (Signed)
Pt c/o pain in right  foot since waking up this am, st's can't put any wt on it.  Pt denies any injury.  Pt has swelling in right foot up to knee, denies any leg pain.  Pt also c/o increased shortness of breath over past week with feeling of fatigue.  Pt is on home 02 at 2LPM.  Daughter at bedside.

## 2011-12-09 NOTE — ED Notes (Signed)
The pt has multiple complaints rt foot pain with swollen she feels tired  Much of the time and some intermittnet sob. Exertional sob

## 2011-12-09 NOTE — ED Provider Notes (Signed)
Medical screening examination/treatment/procedure(s) were performed by non-physician practitioner and as supervising physician I was immediately available for consultation/collaboration.   Dione Booze, MD 12/09/11 [phone]

## 2011-12-10 ENCOUNTER — Other Ambulatory Visit: Payer: Self-pay

## 2011-12-10 LAB — URIC ACID: Uric Acid, Serum: 11.3 mg/dL — ABNORMAL HIGH (ref 2.4–7.0)

## 2011-12-10 LAB — PRO B NATRIURETIC PEPTIDE: Pro B Natriuretic peptide (BNP): 3803 pg/mL — ABNORMAL HIGH (ref 0–125)

## 2011-12-10 MED ORDER — PREDNISONE 20 MG PO TABS
40.0000 mg | ORAL_TABLET | Freq: Once | ORAL | Status: AC
  Administered 2011-12-10: 40 mg via ORAL
  Filled 2011-12-10: qty 2

## 2011-12-10 MED ORDER — OXYCODONE-ACETAMINOPHEN 5-325 MG PO TABS
ORAL_TABLET | ORAL | Status: DC
Start: 2011-12-10 — End: 2011-12-11

## 2011-12-10 MED ORDER — PREDNISONE 20 MG PO TABS: 40.0000 mg | ORAL_TABLET | Freq: Every day | ORAL | Status: DC

## 2011-12-10 MED ORDER — FUROSEMIDE 10 MG/ML IJ SOLN
80.0000 mg | Freq: Once | INTRAMUSCULAR | Status: AC
  Administered 2011-12-10: 80 mg via INTRAVENOUS
  Filled 2011-12-10: qty 8

## 2011-12-10 MED ORDER — MORPHINE SULFATE 4 MG/ML IJ SOLN
4.0000 mg | Freq: Once | INTRAMUSCULAR | Status: AC
  Administered 2011-12-10: 4 mg via INTRAVENOUS
  Filled 2011-12-10: qty 1

## 2011-12-10 NOTE — ED Provider Notes (Addendum)
History     CSN: [id number]  Arrival date & time 12/09/11  2136   First MD Initiated Contact with Patient 12/10/11 0013      Chief Complaint  Patient presents with  . Fatigue    (Consider location/radiation/quality/duration/timing/severity/associated sxs/prior treatment) HPI Comments: Patient has a history of chronic kidney disease, diabetes and is on home oxygen do to chronic bronchitis as well as pulmonary edema. She she comes here primarily due to onset of right foot pain that occurred this morning not related to any specific trauma. She reports that her foot is swollen, hurts to touch even with light touch such as putting her socks on her putting her shoes on, and certainly hurts worse with movement. She denies any abrasions or lacerations, no fevers or chills. She does not have any redness that is moving from her foot up her leg. She denies any enlarged lymph nodes. She also thinks that her shortness of breath is a little worse than usual although it is a chronic problem. She denies any chest pain. She already takes 40 mg of Lasix 4 times per day. However on her and they are, she takes 80 mg tablets of Lasix 2 tablets twice a day. She also has a graft in her left upper extremity in preparation for dialysis although she has not yet become so. She denies any fevers, runny nose, sore throat or worsening of her chronic cough. Her cough is nonproductive. She denies any pleurisy. She reports no history of gout although her family member reports that she does have a calcium imbalance at baseline. She denies any calf tenderness or swelling be on her ankle area. She reports that she also has a history of a bone spur at her right heel. She reports this pain is different from any pain that she's had in her foot before.  The history is provided by the patient and a relative.    Past Medical History  Diagnosis Date  . Type II or unspecified type diabetes mellitus without mention of complication, not  stated as uncontrolled   . Esophageal reflux   . DJD (degenerative joint disease)   . Depressive disorder, not elsewhere classified   . Unspecified essential hypertension   . Chronic cough   . Renal insufficiency   . CHF (congestive heart failure)     with well preserved EF   . HTN (hypertension)   . HLD (hyperlipidemia)     Past Surgical History  Procedure Date  . Insertion of dialysis catheter     Family History  Problem Relation Age of Onset  . Heart disease Father   . Cancer Sister   . Hypertension    . Diabetes      History  Substance Use Topics  . Smoking status: Former Smoker -- 1.0 packs/day for 30 years    Types: Cigarettes    Quit date: 11/22/2007  . Smokeless tobacco: Not on file   Comment: 1ppd x 30 years  . Alcohol Use: No    OB History    Grav Para Term Preterm Abortions TAB SAB Ect Mult Living                  Review of Systems  Constitutional: Negative for fever and chills.  HENT: Negative for congestion, rhinorrhea and sinus pressure.   Respiratory: Positive for cough and shortness of breath.   Cardiovascular: Negative for chest pain and palpitations.  Gastrointestinal: Negative for nausea, vomiting and abdominal pain.  Musculoskeletal: Positive for  joint swelling and arthralgias.  Skin: Negative for rash and wound.  All other systems reviewed and are negative.    Allergies  Codeine  Home Medications   Current Outpatient Rx  Name Route Sig Dispense Refill  . ALBUTEROL SULFATE HFA 108 (90 BASE) MCG/ACT IN AERS Inhalation Inhale 2 puffs into the lungs every 6 (six) hours as needed. For wheezing    . ASPIRIN 81 MG PO TABS Oral Take 81 mg by mouth daily.      . ATORVASTATIN CALCIUM 40 MG PO TABS Oral Take 1 tablet by mouth every morning.     [place] Kitchen CALCIUM ACETATE 667 MG PO CAPS Oral Take 1 tablet by mouth 3 (three) times daily with meals.     [place] Kitchen CARVEDILOL 6.25 MG PO TABS Oral Take 6.25 mg by mouth 2 (two) times daily with a meal.      .  VITAMIN D3 5000 UNITS PO CAPS Oral Take 2 capsules by mouth daily.    [place] Kitchen CITALOPRAM HYDROBROMIDE 40 MG PO TABS Oral Take 1 tablet by mouth daily.    [place] Kitchen CLONIDINE HCL 0.3 MG PO TABS Oral Take 0.3 mg by mouth 2 (two) times daily. Take 1/2 two times a day    . DILTIAZEM HCL ER BEADS 360 MG PO CP24 Oral Take 1 tablet by mouth every morning.     . FUROSEMIDE 80 MG PO TABS Oral Take 160 mg by mouth 2 (two) times daily.     . INSULIN GLARGINE 100 UNIT/ML [place] SOLN Subcutaneous Inject into the skin. 60 units every morning    . INSULIN LISPRO (HUMAN) 100 UNIT/ML [place] SOLN Subcutaneous Inject 14-26 Units into the skin 3 (three) times daily. Sliding scale as directed: 70-150=14 units, 151-200=17 units, 210-250=20 units, 251-300=23 units & >300=26 units    . LORATADINE 10 MG PO TABS Oral Take 10 mg by mouth daily.    [place] Kitchen MAGNESIUM 200 MG PO TABS Oral Take 1 tablet by mouth daily.    [place] Kitchen METOLAZONE 5 MG PO TABS Oral Take 5 mg by mouth every Monday, Wednesday, and Friday.     [place] Kitchen PARICALCITOL 1 MCG PO CAPS Oral Take 1 mcg by mouth every Monday, Wednesday, and Friday.     [place] Kitchen POTASSIUM CHLORIDE CRYS ER 20 MEQ PO TBCR Oral Take 40 mEq by mouth 2 (two) times daily. 2 twice daily    . PREGABALIN 50 MG PO CAPS Oral Take 50 mg by mouth at bedtime.     . TRAMADOL HCL 50 MG PO TABS Oral Take 50 mg by mouth every 4 (four) hours as needed. For pain    . TRIAMCINOLONE ACETONIDE 55 MCG/ACT NA INHA Nasal Place 2 sprays into the nose daily as needed. For nasal congestion    . VITAMIN B-12 1000 MCG PO TABS Oral Take 1,000 mcg by mouth at bedtime.     . OXYCODONE-ACETAMINOPHEN 5-325 MG PO TABS  1-2 tablets po q 6 hours prn moderate to severe pain 15 tablet 0  . PREDNISONE 20 MG PO TABS Oral Take 2 tablets (40 mg total) by mouth daily. 12 tablet 0    BP 147/52  Pulse 59  Temp(Src) 98.8 F (37.1 C) (Oral)  Resp 18  SpO2 97%  Physical Exam  Nursing note and vitals reviewed. Constitutional: She appears well-developed and  well-nourished.  HENT:  Head: Normocephalic and atraumatic.  Eyes: Pupils are equal, round, and reactive to light.  Neck: Normal range of motion. Neck supple. No JVD present. No  tracheal deviation present. No thyromegaly present.  Cardiovascular: Normal rate and regular rhythm.   No murmur heard. Abdominal: Soft.  Musculoskeletal: She exhibits edema and tenderness.       Feet:  Neurological: She is alert. Coordination normal.  Skin: Skin is warm. No rash noted. No erythema. No pallor.  Psychiatric: She has a normal mood and affect.    ED Course  Procedures (including critical care time)  Labs Reviewed  CBC - Abnormal; Notable for the following:    RBC 3.56 (*)    Hemoglobin 10.5 (*)    HCT 33.2 (*)    RDW 17.3 (*)    All other components within normal limits  CARDIAC PANEL(CRET KIN+CKTOT+MB+TROPI) - Abnormal; Notable for the following:    Relative Index 3.4 (*)    All other components within normal limits  POCT I-STAT, CHEM 8 - Abnormal; Notable for the following:    BUN 67 (*)    Creatinine, Ser 3.90 (*)    Glucose, Bld 252 (*)    Hemoglobin 11.2 (*)    HCT 33.0 (*)    All other components within normal limits  URIC ACID - Abnormal; Notable for the following:    Uric Acid, Serum 11.3 (*)    All other components within normal limits  PRO B NATRIURETIC PEPTIDE - Abnormal; Notable for the following:    Pro B Natriuretic peptide (BNP) 3803.0 (*)    All other components within normal limits  DIFFERENTIAL  I-STAT, CHEM 8   Dg Chest 2 View  12/10/2011  *RADIOLOGY REPORT*  Clinical Data: Shortness of breath.  History of CHF.  CHEST - 2 VIEW  Comparison: 06/15/2011  Findings: There is cardiomegaly with vascular congestion.  No definite overt edema.  No focal opacities or effusions.  No acute bony abnormality.  IMPRESSION: Cardiomegaly, vascular congestion.  Original Report Authenticated By: Cyndie Chime, M.D.   Dg Foot Complete Right  12/10/2011  *RADIOLOGY REPORT*   Clinical Data: Pain, swelling.  RIGHT FOOT COMPLETE - 3+ VIEW  Comparison: None.  Findings: No acute bony abnormality.  Specifically, no fracture, subluxation, or dislocation.  Soft tissues are intact.  Plantar calcaneal spur.  IMPRESSION: No acute bony abnormality.  Original Report Authenticated By: Cyndie Chime, M.D.   I reviewed above films myself  1. Gout   2. CHF (congestive heart failure)   3. Chronic renal disease     Saturations on nasal cannula oxygen is 97% which is likely normal for her.  MDM   EKG at time 00:15 shows normal sinus rhythm at rate 67. Normal axis, normal intervals and no ST or T wave changes. This is similar in appearance to EKG on date 06/15/2011.  Patient's exam and history suggests possibly gout or pseudogout. Plan is to put her on prednisone and oral analgesics. Patient will be given some IV morphine here. Will also give her 80 mg of Lasix and place a Foley catheter as I feel the patient has some mild congestive heart failure and pulmonary edema coming for her shortness of breath. She has no chest pain and abnormal EKG so I do not feel that this is related to acute coronary syndrome. X-rays which I also reviewed shows no specific acute abnormalities of her right foot. I've discussed my suspicions and plan with the patient and family who are in agreement with plan thus far. She is told that oral steroids may cause an increase in her blood sugar, and she can watch this carefully at  home and treat with sliding scale insulin as needed.      2:58 AM Pt reports she feels improved.  sats are adequate.  Will treat for gout and she can follow up with PCP.  Uric acid is elevated, suggestive of gout.    Gavin Pound. Oletta Lamas, MD 12/10/11 9604  Gavin Pound. Oletta Lamas, MD 12/10/11 [id number]

## 2011-12-10 NOTE — Discharge Instructions (Signed)
 Gout Gout is an inflammatory condition (arthritis) caused by a buildup of uric acid crystals in the joints. Uric acid is a chemical that is normally present in the blood. Under some circumstances, uric acid can form into crystals in your joints. This causes joint redness, soreness, and swelling (inflammation). Repeat attacks are common. Over time, uric acid crystals can form into masses (tophi) near a joint, causing disfigurement. Gout is treatable and often preventable. CAUSES  The disease begins with elevated levels of uric acid in the blood. Uric acid is produced by your body when it breaks down a naturally found substance called purines. This also happens when you eat certain foods such as meats and fish. Causes of an elevated uric acid level include:  Being passed down from parent to child (heredity).   Diseases that cause increased uric acid production (obesity, psoriasis, some cancers).   Excessive alcohol use.   Diet, especially diets rich in meat and seafood.   Medicines, including certain cancer-fighting drugs (chemotherapy), diuretics, and aspirin .   Chronic kidney disease. The kidneys are no longer able to remove uric acid well.   Problems with metabolism.  Conditions strongly associated with gout include:  Obesity.   High blood pressure.   High cholesterol.   Diabetes.  Not everyone with elevated uric acid levels gets gout. It is not understood why some people get gout and others do not. Surgery, joint injury, and eating too much of certain foods are some of the factors that can lead to gout. SYMPTOMS   An attack of gout comes on quickly. It causes intense pain with redness, swelling, and warmth in a joint.   Fever can occur.   Often, only one joint is involved. Certain joints are more commonly involved:   Base of the big toe.   Knee.   Ankle.   Wrist.   Finger.  Without treatment, an attack usually goes away in a few days to weeks. Between attacks, you  usually will not have symptoms, which is different from many other forms of arthritis. DIAGNOSIS  Your caregiver will suspect gout based on your symptoms and exam. Removal of fluid from the joint (arthrocentesis) is done to check for uric acid crystals. Your caregiver will give you a medicine that numbs the area (local anesthetic) and use a needle to remove joint fluid for exam. Gout is confirmed when uric acid crystals are seen in joint fluid, using a special microscope. Sometimes, blood, urine, and X-ray tests are also used. TREATMENT  There are 2 phases to gout treatment: treating the sudden onset (acute) attack and preventing attacks (prophylaxis). Treatment of an Acute Attack  Medicines are used. These include anti-inflammatory medicines or steroid medicines.   An injection of steroid medicine into the affected joint is sometimes necessary.   The painful joint is rested. Movement can worsen the arthritis.   You may use warm or cold treatments on painful joints, depending which works best for you.   Discuss the use of coffee, vitamin C, or cherries with your caregiver. These may be helpful treatment options.  Treatment to Prevent Attacks After the acute attack subsides, your caregiver may advise prophylactic medicine. These medicines either help your kidneys eliminate uric acid from your body or decrease your uric acid production. You may need to stay on these medicines for a very long time. The early phase of treatment with prophylactic medicine can be associated with an increase in acute gout attacks. For this reason, during the first few months  of treatment, your caregiver may also advise you to take medicines usually used for acute gout treatment. Be sure you understand your caregiver's directions. You should also discuss dietary treatment with your caregiver. Certain foods such as meats and fish can increase uric acid levels. Other foods such as dairy can decrease levels. Your caregiver  can give you a list of foods to avoid. HOME CARE INSTRUCTIONS   Do not take aspirin  to relieve pain. This raises uric acid levels.   Only take over-the-counter or prescription medicines for pain, discomfort, or fever as directed by your caregiver.   Rest the joint as much as possible. When in bed, keep sheets and blankets off painful areas.   Keep the affected joint raised (elevated).   Use crutches if the painful joint is in your leg.   Drink enough water and fluids to keep your urine clear or pale yellow. This helps your body get rid of uric acid. Do not drink alcoholic beverages. They slow the passage of uric acid.   Follow your caregiver's dietary instructions. Pay careful attention to the amount of protein you eat. Your daily diet should emphasize fruits, vegetables, whole grains, and fat-free or low-fat milk products.   Maintain a healthy body weight.  SEEK MEDICAL CARE IF:   You have an oral temperature above 102 F (38.9 C).   You develop diarrhea, vomiting, or any side effects from medicines.   You do not feel better in 24 hours, or you are getting worse.  SEEK IMMEDIATE MEDICAL CARE IF:   Your joint becomes suddenly more tender and you have:   Chills.   An oral temperature above 102 F (38.9 C), not controlled by medicine.  MAKE SURE YOU:   Understand these instructions.   Will watch your condition.   Will get help right away if you are not doing well or get worse.  Document Released: 11/04/2000 Document Revised: 07/20/2011 Document Reviewed: 02/15/2010 [facility] Patient Information 2012 [place], [place].    Pulmonary Edema Pulmonary edema (PE) is a condition in which fluid collects in the lungs. This makes it hard to breathe. PE may be a result of the heart not pumping very well, or a result of injury.  CAUSES   Coronary artery disease (blockages in the arteries of the heart) deprives the heart muscle of oxygen, and weakens the muscle. A heart attack is a  form of coronary artery disease.   High blood pressure causes the heart muscle to work harder than usual. Over time, the heart muscle may get stiff, and it starts to work less efficiently. It may also fatigue and weaken.   Viral infection of the heart (myocarditis) may weaken the heart muscle.   Metabolic conditions such as thyroid disease, excessive alcohol use, certain vitamin deficiencies, or diabetes, may also weaken the heart muscle.   Leaky or stiff heart valves may impair normal heart function.   Lung disease may strain the heart muscle.   Excessive demands on the heart such as too much salt or fluid intake. Failure to take prescribed medicines.   Lung injury from toxins such as heat or poisonous gas.   Infection in the lungs or other parts of the body.   Fluid overload caused by kidney failure or medications.  SYMPTOMS   Shortness of breath at rest or with exertion.   Grunting, wheezing or gurgling while breathing.   Feeling like you are cannot get enough air.   Breaths are shallow and fast.   A  lot of coughing with frothy or bloody mucus.   Skin may become cool, damp and turn a pale or bluish color.  DIAGNOSIS  Initial diagnosis may be based on your history, symptoms, and a physical examination. Additional tests for PE may include:  EKG.   Chest X-ray.   Blood tests.   Stress test.   Ultrasound evaluation of the heart (echocardiogram).   Evaluation by a heart doctor (cardiologist).   Test of the heart arteries to look for blockages (angiogram).   Check of blood oxygen.  TREATMENT  Treatment of PE will depend on the underlying cause and will focus first on relieving the symptoms.  Extra oxygen to make breathing easier and assist with removing mucus. This may include breathing treatments or a tube into the lungs and a breathing machine.   Medicine to help the body get rid of extra water, usually through an IV.   IV drugs or pills to help the heart pump  better.   If poor heart function is the cause, it may include:   Procedures to open blocked arteries, repair damaged heart valves, or remove some of the damaged heart muscle.   A pacemaker to help the heart pump with less effort.  HOME CARE INSTRUCTIONS   Activity Level -- Your caregiver will help you determine what type of exercise program may be helpful. It is important to maintain strength and increase it if possible. Pace your activities to avoid shortness of breath or chest pain. Rest for at least 1 hour before and after meals. Cardiac rehabilitation programs are available in some locations.   Diet -- Eat a heart healthy diet, low in salt, saturated fat, and cholesterol. Ask for help with choices.   Weight Monitoring --Record your hospital or clinic weight. When you get home, compare it to your scale and record your weight. Then, weigh yourself first thing in the morning daily, and record the weights. Tell your caregiver right away if you have gained 3 lb/1.4 kg in 1 day, 5 lb/2.3 kg in a week, or whatever amount you were told to report. Your medications may need to be adjusted.   Blood pressure monitoring should be done as often as directed. You can get a home blood pressure cuff at your drugstore. Record these values and bring them with you for your clinic visits. Notify your caregiver if you become dizzy or lightheaded upon standing up.   If you are currently a smoker, it is time to quit. Nicotine makes your heart work harder and is one of the leading causes of heart (cardiac) deaths. Do not use nicotine gum or patches before talking to your doctor.   Make an appointment with your caregiver as directed for follow-up.  SEEK IMMEDIATE MEDICAL CARE IF:   You have severe chest pain. Especially if the pain is crushing or pressure-like and spreads to the arms, back, neck, or jaw, or if you have sweating, are feeling sick to your stomach (nausea), or you are experiencing shortness of breath.  THIS IS AN EMERGENCY. DO NOT wait to see if the pain will go away. Get medical help at once. Call for local emergency medical help. DO NOT drive yourself to the hospital.   Your weight increases by 3 lb/1.4 kg in 1 day or 5 lb/2.3 kg in a week.   You notice increasing shortness of breath that is unusual for you. This may happen during rest, sleep or with activity.   You develop chest pain (angina) or  pain that is unusual for you.   You develop sweating or nausea that is unusual for you.   You notice more swelling in your hands, feet, ankles or abdomen.   You notice lasting (persistent) dizziness, blurred vision, headache, or unsteadiness.   You begin to cough up bloody mucus (sputum).   You are unable to sleep because it is hard to breathe.   You begin to feel a jumping or fluttering sensation (palpitations) in the chest that is unusual for you.  IMPORTANT:  Make a list of every medicine, vitamin, or herbal supplement you are taking. Keep the list with you at all times. Show it to your caregiver at every visit and before starting a new medicine. Keep the list up to date.   Ask your caregiver or pharmacist to help you write a plan or schedule so that you know things about each medicine such as:   Why you are taking it.   The possible side effects.   The best time of day to take it.   Foods to take with it or avoid.   When to stop taking it.   Ask your caregiver for a copy of your latest heart tracing (EKG or ECG). Keep a copy with you at all times.  Document Released: 01/28/2003 Document Revised: 07/20/2011 Document Reviewed: 01/04/2008 [facility] Patient Information 2012 [place], [place].   BE SURE TO WATCH YOUR BLOOD SUGARS CAREFULLY WHILE TAKING STEROIDS

## 2011-12-11 ENCOUNTER — Other Ambulatory Visit: Payer: Self-pay

## 2011-12-11 ENCOUNTER — Inpatient Hospital Stay (HOSPITAL_COMMUNITY)
Admission: EM | Admit: 2011-12-11 | Discharge: 2011-12-15 | DRG: 682 | Disposition: A | Discharge status: Home / Self Care | Payer: Medicare Other | Source: Ambulatory Visit | Attending: Internal Medicine | Admitting: Internal Medicine

## 2011-12-11 ENCOUNTER — Emergency Department (HOSPITAL_COMMUNITY): Payer: Medicare Other

## 2011-12-11 ENCOUNTER — Encounter (HOSPITAL_COMMUNITY): Payer: Self-pay | Admitting: *Deleted

## 2011-12-11 DIAGNOSIS — E875 Hyperkalemia: Secondary | ICD-10-CM | POA: Diagnosis present

## 2011-12-11 DIAGNOSIS — N186 End stage renal disease: Secondary | ICD-10-CM | POA: Diagnosis present

## 2011-12-11 DIAGNOSIS — J4489 Other specified chronic obstructive pulmonary disease: Secondary | ICD-10-CM | POA: Diagnosis present

## 2011-12-11 DIAGNOSIS — D631 Anemia in chronic kidney disease: Secondary | ICD-10-CM | POA: Diagnosis present

## 2011-12-11 DIAGNOSIS — M1990 Unspecified osteoarthritis, unspecified site: Secondary | ICD-10-CM | POA: Diagnosis present

## 2011-12-11 DIAGNOSIS — I120 Hypertensive chronic kidney disease with stage 5 chronic kidney disease or end stage renal disease: Principal | ICD-10-CM | POA: Diagnosis present

## 2011-12-11 DIAGNOSIS — R001 Bradycardia, unspecified: Secondary | ICD-10-CM

## 2011-12-11 DIAGNOSIS — I509 Heart failure, unspecified: Secondary | ICD-10-CM | POA: Diagnosis present

## 2011-12-11 DIAGNOSIS — I498 Other specified cardiac arrhythmias: Secondary | ICD-10-CM | POA: Diagnosis present

## 2011-12-11 DIAGNOSIS — I5032 Chronic diastolic (congestive) heart failure: Secondary | ICD-10-CM | POA: Diagnosis present

## 2011-12-11 DIAGNOSIS — F329 Major depressive disorder, single episode, unspecified: Secondary | ICD-10-CM | POA: Diagnosis present

## 2011-12-11 DIAGNOSIS — N179 Acute kidney failure, unspecified: Secondary | ICD-10-CM

## 2011-12-11 DIAGNOSIS — Z9981 Dependence on supplemental oxygen: Secondary | ICD-10-CM

## 2011-12-11 DIAGNOSIS — F411 Generalized anxiety disorder: Secondary | ICD-10-CM | POA: Diagnosis present

## 2011-12-11 DIAGNOSIS — Z886 Allergy status to analgesic agent status: Secondary | ICD-10-CM

## 2011-12-11 DIAGNOSIS — Z79899 Other long term (current) drug therapy: Secondary | ICD-10-CM

## 2011-12-11 DIAGNOSIS — F3289 Other specified depressive episodes: Secondary | ICD-10-CM | POA: Diagnosis present

## 2011-12-11 DIAGNOSIS — Z87891 Personal history of nicotine dependence: Secondary | ICD-10-CM

## 2011-12-11 DIAGNOSIS — E663 Overweight: Secondary | ICD-10-CM

## 2011-12-11 DIAGNOSIS — J449 Chronic obstructive pulmonary disease, unspecified: Secondary | ICD-10-CM | POA: Diagnosis present

## 2011-12-11 DIAGNOSIS — Z7982 Long term (current) use of aspirin: Secondary | ICD-10-CM

## 2011-12-11 DIAGNOSIS — Z794 Long term (current) use of insulin: Secondary | ICD-10-CM

## 2011-12-11 DIAGNOSIS — Z22322 Carrier or suspected carrier of Methicillin resistant Staphylococcus aureus: Secondary | ICD-10-CM

## 2011-12-11 DIAGNOSIS — R0602 Shortness of breath: Secondary | ICD-10-CM

## 2011-12-11 DIAGNOSIS — N2581 Secondary hyperparathyroidism of renal origin: Secondary | ICD-10-CM | POA: Diagnosis present

## 2011-12-11 DIAGNOSIS — K219 Gastro-esophageal reflux disease without esophagitis: Secondary | ICD-10-CM | POA: Diagnosis present

## 2011-12-11 DIAGNOSIS — E785 Hyperlipidemia, unspecified: Secondary | ICD-10-CM | POA: Diagnosis present

## 2011-12-11 DIAGNOSIS — E1169 Type 2 diabetes mellitus with other specified complication: Secondary | ICD-10-CM | POA: Diagnosis present

## 2011-12-11 HISTORY — DX: Anxiety disorder, unspecified: F41.9

## 2011-12-11 HISTORY — DX: Shortness of breath: R06.02

## 2011-12-11 HISTORY — DX: Anemia, unspecified: D64.9

## 2011-12-11 LAB — HEPATIC FUNCTION PANEL
ALT: 206 U/L — ABNORMAL HIGH (ref 0–35)
AST: 143 U/L — ABNORMAL HIGH (ref 0–37)
Albumin: 3 g/dL — ABNORMAL LOW (ref 3.5–5.2)
Alkaline Phosphatase: 105 U/L (ref 39–117)
Total Protein: 6.6 g/dL (ref 6.0–8.3)

## 2011-12-11 LAB — POCT I-STAT, CHEM 8
BUN: 102 mg/dL — ABNORMAL HIGH (ref 6–23)
Calcium, Ion: 1.15 mmol/L (ref 1.12–1.32)
Chloride: 111 mEq/L (ref 96–112)
Glucose, Bld: 320 mg/dL — ABNORMAL HIGH (ref 70–99)
TCO2: 20 mmol/L (ref 0–100)

## 2011-12-11 LAB — LACTIC ACID, PLASMA: Lactic Acid, Venous: 4.8 mmol/L — ABNORMAL HIGH (ref 0.5–2.2)

## 2011-12-11 LAB — DIFFERENTIAL
Basophils Relative: 0 % (ref 0–1)
Eosinophils Absolute: 0 10*3/uL (ref 0.0–0.7)
Eosinophils Relative: 0 % (ref 0–5)
Lymphs Abs: 1.4 10*3/uL (ref 0.7–4.0)
Neutrophils Relative %: 86 % — ABNORMAL HIGH (ref 43–77)

## 2011-12-11 LAB — PRO B NATRIURETIC PEPTIDE: Pro B Natriuretic peptide (BNP): 5876 pg/mL — ABNORMAL HIGH (ref 0–125)

## 2011-12-11 LAB — POCT I-STAT TROPONIN I

## 2011-12-11 LAB — BASIC METABOLIC PANEL
CO2: 18 mEq/L — ABNORMAL LOW (ref 19–32)
Chloride: 101 mEq/L (ref 96–112)
Creatinine, Ser: 5.16 mg/dL — ABNORMAL HIGH (ref 0.50–1.10)
GFR calc Af Amer: 9 mL/min — ABNORMAL LOW (ref >90)
Potassium: 7.9 mEq/L (ref 3.5–5.1)
Sodium: 132 mEq/L — ABNORMAL LOW (ref 135–145)

## 2011-12-11 LAB — TROPONIN I: Troponin I: <0.3 ng/mL (ref <0.30)

## 2011-12-11 LAB — PROTIME-INR: Prothrombin Time: 15.5 seconds — ABNORMAL HIGH (ref 11.6–15.2)

## 2011-12-11 LAB — LIPASE, BLOOD: Lipase: 14 U/L (ref 11–59)

## 2011-12-11 LAB — CBC
MCH: 29.2 pg (ref 26.0–34.0)
MCHC: 30.8 g/dL (ref 30.0–36.0)
MCV: 94.8 fL (ref 78.0–100.0)
Platelets: 226 10*3/uL (ref 150–400)

## 2011-12-11 MED ORDER — ATROPINE SULFATE 1 MG/ML IJ SOLN
INTRAMUSCULAR | Status: AC
  Filled 2011-12-11: qty 1

## 2011-12-11 MED ORDER — ONDANSETRON HCL 4 MG/2ML IJ SOLN
4.0000 mg | Freq: Once | INTRAMUSCULAR | Status: AC
  Administered 2011-12-11: 4 mg via INTRAVENOUS
  Filled 2011-12-11: qty 2

## 2011-12-11 MED ORDER — INSULIN REGULAR HUMAN 100 UNIT/ML IJ SOLN: 5.0000 [IU] | Freq: Once | INTRAMUSCULAR | Status: DC

## 2011-12-11 MED ORDER — FUROSEMIDE 10 MG/ML IJ SOLN
80.0000 mg | Freq: Once | INTRAMUSCULAR | Status: AC
  Administered 2011-12-11: 80 mg via INTRAVENOUS
  Filled 2011-12-11: qty 8

## 2011-12-11 MED ORDER — ASPIRIN 325 MG PO TABS
325.0000 mg | ORAL_TABLET | Freq: Once | ORAL | Status: AC
  Administered 2011-12-11: 325 mg via ORAL

## 2011-12-11 MED ORDER — PENTAFLUOROPROP-TETRAFLUOROETH EX AERO: 1.0000 "application " | INHALATION_SPRAY | CUTANEOUS | Status: DC | PRN

## 2011-12-11 MED ORDER — INSULIN REGULAR HUMAN 100 UNIT/ML IJ SOLN
10.0000 [IU] | INTRAMUSCULAR | Status: DC
  Filled 2011-12-11: qty 0.1

## 2011-12-11 MED ORDER — ATROPINE SULFATE 0.1 MG/ML IJ SOLN
0.5000 mg | Freq: Once | INTRAMUSCULAR | Status: AC
  Administered 2011-12-11: 0.5 mg via INTRAVENOUS
  Filled 2011-12-11: qty 10

## 2011-12-11 MED ORDER — SODIUM CHLORIDE 0.9 % IV SOLN: 100.0000 mL | INTRAVENOUS | Status: DC | PRN

## 2011-12-11 MED ORDER — SODIUM POLYSTYRENE SULFONATE 15 GM/60ML PO SUSP
30.0000 g | Freq: Once | ORAL | Status: AC
  Administered 2011-12-11: 30 g via ORAL
  Filled 2011-12-11: qty 120

## 2011-12-11 MED ORDER — SODIUM CHLORIDE 0.9 % IV SOLN
1.0000 g | INTRAVENOUS | Status: AC
  Administered 2011-12-11: 1 g via INTRAVENOUS
  Filled 2011-12-11: qty 10

## 2011-12-11 MED ORDER — NEPRO/CARBSTEADY PO LIQD
237.0000 mL | ORAL | Status: DC | PRN
  Filled 2011-12-11: qty 237

## 2011-12-11 MED ORDER — INSULIN ASPART 100 UNIT/ML ~~LOC~~ SOLN
5.0000 [IU] | Freq: Once | SUBCUTANEOUS | Status: AC
  Administered 2011-12-11: 5 [IU] via INTRAVENOUS

## 2011-12-11 MED ORDER — GLUCAGON HCL (RDNA) 1 MG IJ SOLR
1.0000 mg | Freq: Once | INTRAMUSCULAR | Status: AC
  Administered 2011-12-11: 1 mg via INTRAVENOUS
  Filled 2011-12-11: qty 1

## 2011-12-11 MED ORDER — SODIUM CHLORIDE 0.9 % IV BOLUS (SEPSIS)
500.0000 mL | Freq: Once | INTRAVENOUS | Status: AC
  Administered 2011-12-11: 500 mL via INTRAVENOUS

## 2011-12-11 MED ORDER — ALBUTEROL (5 MG/ML) CONTINUOUS INHALATION SOLN
10.0000 mg/h | INHALATION_SOLUTION | Freq: Once | RESPIRATORY_TRACT | Status: AC
  Administered 2011-12-11: 10 mg/h via RESPIRATORY_TRACT
  Filled 2011-12-11: qty 20

## 2011-12-11 MED ORDER — ALTEPLASE 2 MG IJ SOLR
2.0000 mg | Freq: Once | INTRAMUSCULAR | Status: AC | PRN
  Filled 2011-12-11: qty 2

## 2011-12-11 MED ORDER — INSULIN ASPART 100 UNIT/ML ~~LOC~~ SOLN
5.0000 [IU] | Freq: Once | SUBCUTANEOUS | Status: DC
  Filled 2011-12-11: qty 1

## 2011-12-11 MED ORDER — LIDOCAINE HCL (PF) 1 % IJ SOLN: 5.0000 mL | INTRAMUSCULAR | Status: DC | PRN

## 2011-12-11 MED ORDER — INSULIN ASPART 100 UNIT/ML ~~LOC~~ SOLN
10.0000 [IU] | SUBCUTANEOUS | Status: AC
  Administered 2011-12-11: 10 [IU] via INTRAVENOUS
  Filled 2011-12-11: qty 1

## 2011-12-11 MED ORDER — CALCIUM GLUCONATE 10 % IV SOLN
INTRAVENOUS | Status: AC
  Filled 2011-12-11: qty 10

## 2011-12-11 MED ORDER — LIDOCAINE-PRILOCAINE 2.5-2.5 % EX CREA: 1.0000 "application " | TOPICAL_CREAM | CUTANEOUS | Status: DC | PRN

## 2011-12-11 MED ORDER — HEPARIN SODIUM (PORCINE) 1000 UNIT/ML DIALYSIS
20.0000 [IU]/kg | INTRAMUSCULAR | Status: DC | PRN
  Filled 2011-12-11: qty 2

## 2011-12-11 MED ORDER — SODIUM BICARBONATE 8.4 % IV SOLN
50.0000 meq | Freq: Once | INTRAVENOUS | Status: AC
  Administered 2011-12-11: 50 meq via INTRAVENOUS
  Filled 2011-12-11: qty 50

## 2011-12-11 MED ORDER — ASPIRIN 325 MG PO TABS
325.0000 mg | ORAL_TABLET | Freq: Once | ORAL | Status: DC
  Filled 2011-12-11 (×2): qty 1

## 2011-12-11 MED ORDER — HEPARIN SODIUM (PORCINE) 1000 UNIT/ML DIALYSIS
1000.0000 [IU] | INTRAMUSCULAR | Status: DC | PRN
  Filled 2011-12-11: qty 1

## 2011-12-11 MED ORDER — SODIUM CHLORIDE 0.9 % IV SOLN
INTRAVENOUS | Status: AC
  Filled 2011-12-11: qty 100

## 2011-12-11 MED ORDER — DEXTROSE 50 % IV SOLN
25.0000 g | Freq: Once | INTRAVENOUS | Status: AC
  Administered 2011-12-11: 25 g via INTRAVENOUS
  Filled 2011-12-11: qty 50

## 2011-12-11 NOTE — ED Notes (Signed)
Hyperkalemia meds held pending re-check of potassium.

## 2011-12-11 NOTE — ED Notes (Addendum)
Per report from day shift RN, the MD advised to hold the Kayexalate, the insulin, and the dextrose until the repeat BMP results are posted.

## 2011-12-11 NOTE — ED Notes (Signed)
Advised MD of new K+ level of 7.9.

## 2011-12-11 NOTE — ED Notes (Addendum)
Patient with reported near syncope today per family.  Patient with weakness since 1430 today.  Patient started on new pain meds a couple of days ago.  Patient complains of abd pain for 3 days with constipation as well. Patient reports nausea,  Denies emesis.  Patient states she is short of breath.  Patient heart rate is 34 per ems.  cbg 384

## 2011-12-11 NOTE — ED Notes (Signed)
Fluid bolus from 2212 discontinued.  Patient received 225 mL.

## 2011-12-11 NOTE — ED Notes (Signed)
Abnormal labs given to MD 

## 2011-12-11 NOTE — Consult Note (Signed)
[name] 12/11/2011 [name] D Requesting Physician:  ED physician, Dr. Meredith Pel at [facility].  Reason for Consult:  Hyperkalemia in pt with known CKD stage IV HPI: The patient is a 66 y.o. year-old with CKD f/b Dr. Hyman Hopes at [facility].  She has a left upper arm AVF about a year old.  Pt presenting with near syncope and weakness since 1430 today.  Started a new pain med 2 days ago.  Abd pain for 3 days and constipation.  No nausea or vomiting, no confusion.  She does endorse SOB, no cough or orthopnea.  Per EMS heart rate was 34, CBG 384.    In ED, telemetry showed wide complex bradycardia; repeat EKG after medical Rx shows narrowing of QRS, still junctional with low rate in the 40's.  Pt on betablocker and CCB.  No CP or SOB currently in ED.  Recent labs as follows:  Creatinine, Ser  Date/Time Value Range Status  12/11/2011  8:23 PM 5.16* 0.50-1.10 (mg/dL) Final  1/61/0960  4:54 PM 5.20* 0.50-1.10 (mg/dL) Final  0/98/1191 47:82 PM 3.90* 0.50-1.10 (mg/dL) Final  07/26/6212  0:86 AM 3.96* 0.50-1.10 (mg/dL) Final  57/06/4695  2:95 AM 3.91* 0.50-1.10 (mg/dL) Final  28/02/1323  4:01 AM 4.05* 0.50-1.10 (mg/dL) Final  02/72/5366  4:40 AM 5.50* 0.50-1.10 (mg/dL) Final  3/47/4259  5:63 AM 5.11* 0.50-1.10 (mg/dL) Final  8/75/6433  2:95 AM 5.59* 0.50-1.10 (mg/dL) Final  1/88/4166  0:63 AM 5.97* 0.50-1.10 (mg/dL) Final  0/16/0109  3:23 AM 6.02* 0.50-1.10 (mg/dL) Final  5/57/3220  2:54 PM 5.97* 0.50-1.10 (mg/dL) Final  2/70/6237 62:83 AM 5.50* 0.50-1.10 (mg/dL) Final     **Please note change in reference range.**  05/06/2011  9:30 AM 5.38* 0.50-1.10 (mg/dL) Final     **Please note change in reference range.**  04/01/2011  1:31 PM 4.35* 0.4-1.2 (mg/dL) Final  1/51/7616 07:37 AM 5.01* 0.4-1.2 (mg/dL) Final  11/26/2692  8:54 AM 4.42* 0.4-1.2 (mg/dL) Final  05/17/349  0:93 PM 3.42* 0.4-1.2 (mg/dL) Final  07/08/2992  7:16 AM 3.78* 0.4-1.2 (mg/dL) Final  96/78/9381  0:17 AM 4.14* 0.4-1.2 (mg/dL) Final  03/30/2584  27:78 AM 3.9* 0.4-1.2 (mg/dL) Final  2/42/3536  1:44 PM 4.0* 0.4-1.2 (mg/dL) Final  01/19/5399  8:67 AM 3.44* 0.4-1.2 (mg/dL) Final  04/21/9508  3:26 AM 4.03* 0.4-1.2 (mg/dL) Final  05/21/2457  0:99 PM 4.46* 0.4-1.2 (mg/dL) Final  8/33/8250  5:39 AM 3.38* 0.4-1.2 (mg/dL) Final  7/67/3419  3:79 AM 3.56* 0.4-1.2 (mg/dL) Final  0/24/0973  5:32 AM 3.36* 0.4-1.2 (mg/dL) Final  9/92/4268  3:41 AM 3.6* 0.4-1.2 (mg/dL) Final  9/62/2297  9:89 AM 3.1* 0.4-1.2 (mg/dL) Final  21/19/4174 08:14 PM 2.8* 0.4-1.2 (mg/dL) Final  48/11/8561  1:49 AM 1.66*  Final  09/22/2008  3:34 PM 1.58*  Final  09/21/2008  2:10 AM 1.7*  Final  09/21/2008  2:00 AM 1.47*  Final    Past Medical History:  Past Medical History  Diagnosis Date  . Type II or unspecified type diabetes mellitus without mention of complication, not stated as uncontrolled   . Esophageal reflux   . DJD (degenerative joint disease)   . Depressive disorder, not elsewhere classified   . Unspecified essential hypertension   . Chronic cough   . Renal insufficiency   . CHF (congestive heart failure)     with well preserved EF   . HTN (hypertension)   . HLD (hyperlipidemia)     Past Surgical History:  Past Surgical History  Procedure Date  . Insertion of dialysis catheter  Family History:  Family History  Problem Relation Age of Onset  . Heart disease Father   . Cancer Sister   . Hypertension    . Diabetes     Social History:  reports that she quit smoking about 4 years ago. Her smoking use included Cigarettes. She has a 30 pack-year smoking history. She does not have any smokeless tobacco history on file. She reports that she does not drink alcohol or use illicit drugs.  Allergies:  Allergies  Allergen Reactions  . Codeine Hives and Itching    Home medications: Prior to Admission medications   Medication Sig Start Date End Date Taking? Authorizing Provider  albuterol (PROVENTIL HFA;VENTOLIN HFA) 108 (90 BASE) MCG/ACT inhaler Inhale 2  puffs into the lungs every 6 (six) hours as needed. For wheezing 08/23/11 08/22/12 Yes Leslye Peer, MD  aspirin 81 MG tablet Take 81 mg by mouth daily.     Yes Historical Provider, MD  atorvastatin (LIPITOR) 40 MG tablet Take 1 tablet by mouth every morning.  02/25/11  Yes Historical Provider, MD  calcium acetate (PHOSLO) 667 MG capsule Take 1 tablet by mouth 3 (three) times daily with meals.  02/09/11  Yes Historical Provider, MD  carvedilol (COREG) 6.25 MG tablet Take 6.25 mg by mouth 2 (two) times daily with a meal.     Yes Historical Provider, MD  Cholecalciferol (VITAMIN D3) 5000 UNITS CAPS Take 2 capsules by mouth daily.   Yes Historical Provider, MD  citalopram (CELEXA) 40 MG tablet Take 1 tablet by mouth daily. 02/18/11  Yes Historical Provider, MD  cloNIDine (CATAPRES) 0.3 MG tablet Take 0.3 mg by mouth 2 (two) times daily. Take 1/2 two times a day   Yes Historical Provider, MD  diltiazem (TIAZAC) 360 MG 24 hr capsule Take 1 tablet by mouth every morning.  02/25/11  Yes Historical Provider, MD  furosemide (LASIX) 80 MG tablet Take 160 mg by mouth 2 (two) times daily.    Yes Historical Provider, MD  insulin glargine (LANTUS) 100 UNIT/ML injection Inject into the skin. 60 units every morning   Yes Historical Provider, MD  insulin lispro (HUMALOG) 100 UNIT/ML injection Inject 14-26 Units into the skin 3 (three) times daily. Sliding scale as directed: 70-150=14 units, 151-200=17 units, 210-250=20 units, 251-300=23 units & >300=26 units   Yes Historical Provider, MD  loratadine (CLARITIN) 10 MG tablet Take 10 mg by mouth daily.   Yes Historical Provider, MD  metolazone (ZAROXOLYN) 5 MG tablet Take 5 mg by mouth every Monday, Wednesday, and Friday.    Yes Historical Provider, MD  potassium chloride SA (K-DUR,KLOR-CON) 20 MEQ tablet Take 40 mEq by mouth 2 (two) times daily. 2 twice daily   Yes Historical Provider, MD  pregabalin (LYRICA) 50 MG capsule Take 50 mg by mouth at bedtime.    Yes Historical  Provider, MD  triamcinolone (NASACORT) 55 MCG/ACT nasal inhaler Place 2 sprays into the nose daily as needed. For nasal congestion   Yes Historical Provider, MD  vitamin B-12 (CYANOCOBALAMIN) 1000 MCG tablet Take 1,000 mcg by mouth at bedtime.    Yes Historical Provider, MD    Inpatient medications:    . albuterol  10 mg/hr Nebulization Once  . aspirin  325 mg Oral Once  . atropine  0.5 mg Intravenous Once  . atropine      . calcium gluconate  1 g Intravenous To Major  . calcium gluconate      . dextrose  25 g Intravenous Once  .  glucagon  1 mg Intravenous Once  . insulin aspart  10 Units Intravenous To Major  . insulin aspart  5 Units Subcutaneous Once  . insulin aspart  5 Units Intravenous Once  . ondansetron (ZOFRAN) IV  4 mg Intravenous Once  . sodium bicarbonate  50 mEq Intravenous Once  . sodium chloride  500 mL Intravenous Once  . sodium chloride      . sodium polystyrene  30 g Oral Once  . DISCONTD: aspirin  325 mg Oral Once  . DISCONTD: insulin regular  10 Units Intravenous To Major  . DISCONTD: insulin regular  5 Units Subcutaneous Once    Review of Systems Gen:  Denies headache, fever, chills, sweats.  No weight loss. HEENT:  No visual change, sore throat, difficulty swallowing. Resp:  No difficulty breathing, DOE.  No cough or hemoptysis. Cardiac:  No chest pain, orthopnea, PND.  Denies edema. GI:   Denies abdominal pain.   No nausea, vomiting, diarrhea.  No constipation. GU:  Denies difficulty or change in voiding.  No change in urine color.     MS:  Denies joint pain or swelling.   Derm:  Denies skin rash or itching.  No chronic skin conditions.  Neuro:   Denies focal weakness, memory problems, hx stroke or TIA.   Psych:  Denies symptoms of depression of anxiety.  No hallucination.    Physical Exam:  Blood pressure 121/50, pulse 44, temperature 97.3 F (36.3 C), temperature source Oral, resp. rate 18, SpO2 94.00%.  Gen: alert older female, no distress  Skin:  no rash, cyanosis Neck: + JVD to angle of jaw, no bruits or LAN Chest: bibasilar fine crackles, no wheezing Heart: regular, no rub or gallop Abdomen: soft, nontender, obese Ext: 1+ edema LE's , large AVF left upper arm Neuro: alert, Ox3, no focal deficit Heme/Lymph: no bruising or LAN  Labs: Basic Metabolic Panel:  Lab 12/11/11 4401 12/11/11 1855 12/09/11 2317  NA 132* 133* 141  K 7.9* 8.3* 4.4  CL 101 111 110  CO2 18* -- --  GLUCOSE 385* 320* 252*  BUN 93* 102* 67*  CREATININE 5.16* 5.20* 3.90*  ALB -- -- --  CALCIUM 9.0 -- --  PHOS -- -- --   Liver Function Tests:  Lab 12/11/11 2023  AST 143*  ALT 206*  ALKPHOS 105  BILITOT 0.5  PROT 6.6  ALBUMIN 3.0*    Lab 12/11/11 1846  LIPASE 14  AMYLASE --   No results found for this basename: AMMONIA:3 in the last 168 hours CBC:  Lab 12/11/11 1855 12/11/11 1846 12/09/11 2317 12/09/11 2240  WBC -- 12.5* -- 8.7  NEUTROABS -- 10.7* -- 6.5  HGB 12.9 11.2* 11.2* 10.5*  HCT 38.0 36.4 33.0* 33.2*  MCV -- 94.8 -- 93.3  PLT -- 226 -- 177   PT/INR: @labrcntip (inr:5) Cardiac Enzymes:  Lab 12/11/11 1846 12/09/11 2240  CKTOTAL -- 116  CKMB -- 3.9  CKMBINDEX -- --  TROPONINI <0.30 <0.30   CBG: No results found for this basename: GLUCAP:5 in the last 168 hours  Iron Studies: No results found for this basename: IRON:30,TIBC:30,TRANSFERRIN:30,FERRITIN:30 in the last 168 hours  Xrays/Other Studies: Dg Chest 2 View  12/10/2011  *RADIOLOGY REPORT*  Clinical Data: Shortness of breath.  History of CHF.  CHEST - 2 VIEW  Comparison: 06/15/2011  Findings: There is cardiomegaly with vascular congestion.  No definite overt edema.  No focal opacities or effusions.  No acute bony abnormality.  IMPRESSION: Cardiomegaly, vascular  congestion.  Original Report Authenticated By: Cyndie Chime, M.D.   Dg Chest Portable 1 View  12/11/2011  *RADIOLOGY REPORT*  Clinical Data: Bradycardia.  Cough and congestion.  PORTABLE CHEST - 1 VIEW   Comparison: PA and lateral chest 12/09/2011.  Findings: There is cardiomegaly with some vascular congestion.  No pneumothorax or effusion.  No consolidative process.  IMPRESSION: Cardiomegaly and pulmonary vascular congestion.  Original Report Authenticated By: Bernadene Bell. D'ALESSIO, M.D.   Dg Foot Complete Right  12/10/2011  *RADIOLOGY REPORT*  Clinical Data: Pain, swelling.  RIGHT FOOT COMPLETE - 3+ VIEW  Comparison: None.  Findings: No acute bony abnormality.  Specifically, no fracture, subluxation, or dislocation.  Soft tissues are intact.  Plantar calcaneal spur.  IMPRESSION: No acute bony abnormality.  Original Report Authenticated By: Cyndie Chime, M.D.    Assessment/Plan 1. Severe hyperkalemia with bradycardia, widened QRS, partial response to acute therapy         in ED.  Needs acute HD tonight.  Pt is agreeable.  See orders. 2.  CKD IV to V - creat was high 3's recently, mid 5's today.  Not overtly uremic.  Not sure if she'll need permanent RRT or not.   3.  DM 2 on insulin 4.  HTN 5.  Hx CHF, with normal EF  Vinson Moselle, MD [facility] [phone] pager   [phone] cell 12/11/2011, 10:39 PM

## 2011-12-11 NOTE — ED Notes (Signed)
Asked lab to call with K+ results a.s.a.p.

## 2011-12-11 NOTE — ED Provider Notes (Signed)
History     CSN: [id number]  Arrival date & time 12/11/11  4401   First MD Initiated Contact with Patient 12/11/11 1836      Chief Complaint  Patient presents with  . Near Syncope  . Bradycardia    (Consider location/radiation/quality/duration/timing/severity/associated sxs/prior treatment) Patient is a 66 y.o. female presenting with abdominal pain. The history is provided by the patient and the EMS personnel.  Abdominal Pain The primary symptoms of the illness include abdominal pain (upper abdomen), fatigue, shortness of breath and nausea. The primary symptoms of the illness do not include fever, vomiting, diarrhea, hematemesis, hematochezia, dysuria, vaginal discharge or vaginal bleeding. The current episode started yesterday. The onset of the illness was gradual. The problem has been gradually worsening.  Additional symptoms associated with the illness include chills. Symptoms associated with the illness do not include anorexia, diaphoresis, constipation, urgency, hematuria, frequency or back pain. Associated medical issues comments: chronic kidney disease, HTN, DM, and CHF.    Past Medical History  Diagnosis Date  . Type II or unspecified type diabetes mellitus without mention of complication, not stated as uncontrolled   . Esophageal reflux   . DJD (degenerative joint disease)   . Depressive disorder, not elsewhere classified   . Unspecified essential hypertension   . Chronic cough   . Renal insufficiency   . CHF (congestive heart failure)     with well preserved EF   . HTN (hypertension)   . HLD (hyperlipidemia)     Past Surgical History  Procedure Date  . Insertion of dialysis catheter     Family History  Problem Relation Age of Onset  . Heart disease Father   . Cancer Sister   . Hypertension    . Diabetes      History  Substance Use Topics  . Smoking status: Former Smoker -- 1.0 packs/day for 30 years    Types: Cigarettes    Quit date: 11/22/2007  .  Smokeless tobacco: Not on file   Comment: 1ppd x 30 years  . Alcohol Use: No    OB History    Grav Para Term Preterm Abortions TAB SAB Ect Mult Living                  Review of Systems  Constitutional: Positive for chills and fatigue. Negative for fever, diaphoresis, activity change and appetite change.  HENT: Negative for congestion, sore throat, rhinorrhea, neck pain and neck stiffness.   Eyes: Negative for photophobia, redness and visual disturbance.  Respiratory: Positive for shortness of breath. Negative for cough and wheezing.   Cardiovascular: Negative for chest pain, palpitations and leg swelling.  Gastrointestinal: Positive for nausea and abdominal pain (upper abdomen). Negative for vomiting, diarrhea, constipation, blood in stool, hematochezia, anorexia and hematemesis.  Genitourinary: Negative for dysuria, urgency, frequency, hematuria, flank pain, vaginal bleeding and vaginal discharge.  Musculoskeletal: Negative for back pain.  Skin: Negative for rash and wound.  Neurological: Positive for weakness (generalized). Negative for dizziness, seizures, facial asymmetry, speech difficulty, light-headedness, numbness and headaches.  Psychiatric/Behavioral: Negative for confusion.  All other systems reviewed and are negative.    Allergies  Codeine  Home Medications   Current Outpatient Rx  Name Route Sig Dispense Refill  . ALBUTEROL SULFATE HFA 108 (90 BASE) MCG/ACT IN AERS Inhalation Inhale 2 puffs into the lungs every 6 (six) hours as needed. For wheezing    . ASPIRIN 81 MG PO TABS Oral Take 81 mg by mouth daily.      Marland Kitchen  ATORVASTATIN CALCIUM 40 MG PO TABS Oral Take 1 tablet by mouth every morning.     Marland Kitchen CALCIUM ACETATE 667 MG PO CAPS Oral Take 1 tablet by mouth 3 (three) times daily with meals.     Marland Kitchen CARVEDILOL 6.25 MG PO TABS Oral Take 6.25 mg by mouth 2 (two) times daily with a meal.      . VITAMIN D3 5000 UNITS PO CAPS Oral Take 2 capsules by mouth daily.    Marland Kitchen  CITALOPRAM HYDROBROMIDE 40 MG PO TABS Oral Take 1 tablet by mouth daily.    Marland Kitchen CLONIDINE HCL 0.3 MG PO TABS Oral Take 0.3 mg by mouth 2 (two) times daily. Take 1/2 two times a day    . DILTIAZEM HCL ER BEADS 360 MG PO CP24 Oral Take 1 tablet by mouth every morning.     . FUROSEMIDE 80 MG PO TABS Oral Take 160 mg by mouth 2 (two) times daily.     . INSULIN GLARGINE 100 UNIT/ML [place] SOLN Subcutaneous Inject into the skin. 60 units every morning    . INSULIN LISPRO (HUMAN) 100 UNIT/ML [place] SOLN Subcutaneous Inject 14-26 Units into the skin 3 (three) times daily. Sliding scale as directed: 70-150=14 units, 151-200=17 units, 210-250=20 units, 251-300=23 units & >300=26 units    . LORATADINE 10 MG PO TABS Oral Take 10 mg by mouth daily.    Marland Kitchen MAGNESIUM 200 MG PO TABS Oral Take 1 tablet by mouth daily.    Marland Kitchen METOLAZONE 5 MG PO TABS Oral Take 5 mg by mouth every Monday, Wednesday, and Friday.     . OXYCODONE-ACETAMINOPHEN 5-325 MG PO TABS  1-2 tablets po q 6 hours prn moderate to severe pain 15 tablet 0  . PARICALCITOL 1 MCG PO CAPS Oral Take 1 mcg by mouth every Monday, Wednesday, and Friday.     Marland Kitchen POTASSIUM CHLORIDE CRYS ER 20 MEQ PO TBCR Oral Take 40 mEq by mouth 2 (two) times daily. 2 twice daily    . PREDNISONE 20 MG PO TABS Oral Take 2 tablets (40 mg total) by mouth daily. 12 tablet 0  . PREGABALIN 50 MG PO CAPS Oral Take 50 mg by mouth at bedtime.     . TRAMADOL HCL 50 MG PO TABS Oral Take 50 mg by mouth every 4 (four) hours as needed. For pain    . TRIAMCINOLONE ACETONIDE 55 MCG/ACT NA INHA Nasal Place 2 sprays into the nose daily as needed. For nasal congestion    . VITAMIN B-12 1000 MCG PO TABS Oral Take 1,000 mcg by mouth at bedtime.       There were no vitals taken for this visit.  Physical Exam  Nursing note and vitals reviewed. Constitutional: She is oriented to person, place, and time. She appears well-developed and well-nourished. She has a sickly appearance. No distress.  HENT:  Head:  Normocephalic and atraumatic.  Mouth/Throat: Oropharynx is clear and moist.  Eyes: Conjunctivae and EOM are normal. Pupils are equal, round, and reactive to light. No scleral icterus.  Neck: Normal range of motion. Neck supple. No JVD present.  Cardiovascular: Regular rhythm, S1 normal, S2 normal, normal heart sounds, intact distal pulses and normal pulses.  Bradycardia present.  Exam reveals no gallop.   No murmur heard. Pulmonary/Chest: Effort normal. No respiratory distress. She has no wheezes. She has rales (mild rales at bases).  Abdominal: Soft. Bowel sounds are normal. She exhibits no distension. There is tenderness (mild tenderness diffusely across upper abdomen). There  is no rebound and no guarding.  Musculoskeletal: Normal range of motion. She exhibits edema (1+  bilaterally).  Neurological: She is alert and oriented to person, place, and time. She has normal strength. No cranial nerve deficit. GCS eye subscore is 4. GCS verbal subscore is 5. GCS motor subscore is 6.  Skin: Skin is warm and dry. No rash noted. She is not diaphoretic.  Psychiatric: She has a normal mood and affect.    ED Course  Procedures (including critical care time)  Labs Reviewed  LACTIC ACID, PLASMA - Abnormal; Notable for the following:    Lactic Acid, Venous 4.8 (*)    All other components within normal limits  PROTIME-INR - Abnormal; Notable for the following:    Prothrombin Time 15.5 (*)    All other components within normal limits  CBC - Abnormal; Notable for the following:    WBC 12.5 (*)    RBC 3.84 (*)    Hemoglobin 11.2 (*)    RDW 17.9 (*)    All other components within normal limits  DIFFERENTIAL - Abnormal; Notable for the following:    Neutrophils Relative 86 (*)    Neutro Abs 10.7 (*)    Lymphocytes Relative 11 (*)    All other components within normal limits  PRO B NATRIURETIC PEPTIDE - Abnormal; Notable for the following:    Pro B Natriuretic peptide (BNP) 5876.0 (*)    All other  components within normal limits  POCT I-STAT, CHEM 8 - Abnormal; Notable for the following:    Sodium 133 (*)    Potassium 8.3 (*)    BUN 102 (*)    Creatinine, Ser 5.20 (*)    Glucose, Bld 320 (*)    All other components within normal limits  BASIC METABOLIC PANEL - Abnormal; Notable for the following:    Sodium 132 (*)    Potassium 7.9 (*)    CO2 18 (*)    Glucose, Bld 385 (*)    BUN 93 (*)    Creatinine, Ser 5.16 (*)    GFR calc non Af Amer 8 (*)    GFR calc Af Amer 9 (*)    All other components within normal limits  HEPATIC FUNCTION PANEL - Abnormal; Notable for the following:    Albumin 3.0 (*)    AST 143 (*)    ALT 206 (*)    Indirect Bilirubin 0.2 (*)    All other components within normal limits  LIPASE, BLOOD  TROPONIN I  POCT I-STAT TROPONIN I  I-STAT TROPONIN I  I-STAT, CHEM 8    ED ECG REPORT   Date: 12/11/2011  EKG Time: 18:55  Rate: 33  Rhythm: Junctional bradycardia  Axis: left  Intervals: QRS prolonged  ST&T Change: [place]  Narrative Interpretation: Profound junctional bradycardia. Absent P waves. [place]. Concerning for hyperkalemia.               Dg Chest 2 View  12/10/2011  *RADIOLOGY REPORT*  Clinical Data: Shortness of breath.  History of CHF.  CHEST - 2 VIEW  Comparison: 06/15/2011  Findings: There is cardiomegaly with vascular congestion.  No definite overt edema.  No focal opacities or effusions.  No acute bony abnormality.  IMPRESSION: Cardiomegaly, vascular congestion.  Original Report Authenticated By: Cyndie Chime, M.D.   Dg Foot Complete Right  12/10/2011  *RADIOLOGY REPORT*  Clinical Data: Pain, swelling.  RIGHT FOOT COMPLETE - 3+ VIEW  Comparison: None.  Findings: No acute bony  abnormality.  Specifically, no fracture, subluxation, or dislocation.  Soft tissues are intact.  Plantar calcaneal spur.  IMPRESSION: No acute bony abnormality.  Original Report Authenticated By: Cyndie Chime, M.D.     1. Junctional  bradycardia   2. Hyperkalemia   3. CHF (congestive heart failure)   4. Acute renal failure       MDM  65yo AAF with PMH signficant for DM2, HTN, CHF, and CKD who presents to the ED due to generalized weakness, upper abdominal pain, and nausea with presyncopal symptoms per family. Pt in no distress on arrival. HR 30s. BP 100s/60s. Getting PIV. Pt has hx HTN and CHF on BB and CCBs. Giving glucagon.   EKG with wide QRS and absent Pwaves and junctional bradycardia. Consistent with hyperkalemia.   At 7:04 PM istat K 8.3, creatinine 5.2. Giving calcium, insulin, D50, albuterol cont neb, and kayexelate. Will repeat to make sure this is accurate.   Nephrology paged.   Repeat K 7.9.  Giving 2nd dose insulin IV.   At 10:08 PM spoke with nephrology they recommend adding bicarb and medical admission. They will dialyze tonight.   At 10:15 PM discussed with critical care they will see pt in ED.   At 10:44 PM critical care and nephrology at bedside.   Critical care admitting. Repeat EKG 67 junctional brady with escape beats. Repeat   Verne Carrow, MD 12/11/11 2322

## 2011-12-11 NOTE — H&P (Signed)
Patient name: [name] Medical record number: [id number] Date of birth: 07/12/1946 Age: 66 y.o. Gender: female PCP: Nadean Corwin, MD, MD  Date: 12/11/2011  Patient Details:    [name] is a 66 y.o. female with ESRD, now hyperkalemia.  Admission for urgent new dialysis.  Lines/tubes :   Microbiology/Sepsis markers: Results for orders placed during the hospital encounter of 07/23/10  MRSA PCR SCREENING     Status: Normal   Collection Time   07/24/10 12:46 AM      Component Value Range Status Comment   MRSA by PCR    NEGATIVE  Final    Value: NEGATIVE            The GeneXpert MRSA Assay (FDA     approved for NASAL specimens     only), is one component of a     comprehensive MRSA colonization     surveillance program. It is not     intended to diagnose MRSA     infection nor to guide or     monitor treatment for     MRSA infections.    Anti-infectives:  Anti-infectives    None      Best Practice/Protocols:  VTE Prophylaxis: Heparin (SQ) GI Prophylaxis: Antihistamine   Consults:  Nephrology    Studies: Dg Chest 2 View  12/10/2011  *RADIOLOGY REPORT*  Clinical Data: Shortness of breath.  History of CHF.  CHEST - 2 VIEW  Comparison: 06/15/2011  Findings: There is cardiomegaly with vascular congestion.  No definite overt edema.  No focal opacities or effusions.  No acute bony abnormality.  IMPRESSION: Cardiomegaly, vascular congestion.  Original Report Authenticated By: Cyndie Chime, M.D.   Dg Chest Portable 1 View  12/11/2011  *RADIOLOGY REPORT*  Clinical Data: Bradycardia.  Cough and congestion.  PORTABLE CHEST - 1 VIEW  Comparison: PA and lateral chest 12/09/2011.  Findings: There is cardiomegaly with some vascular congestion.  No pneumothorax or effusion.  No consolidative process.  IMPRESSION: Cardiomegaly and pulmonary vascular congestion.  Original Report Authenticated By: Bernadene Bell. D'ALESSIO, M.D.   Dg Foot Complete Right  12/10/2011   *RADIOLOGY REPORT*  Clinical Data: Pain, swelling.  RIGHT FOOT COMPLETE - 3+ VIEW  Comparison: None.  Findings: No acute bony abnormality.  Specifically, no fracture, subluxation, or dislocation.  Soft tissues are intact.  Plantar calcaneal spur.  IMPRESSION: No acute bony abnormality.  Original Report Authenticated By: Cyndie Chime, M.D.    Events:  HPI: Ms. Cueva is a 66 year-old woman with chronic renal insufficiency, now with worsened renal failure, needing admission for urgent dialysis.  Passed out at home, found to be bradycardic with potassium of 8.2, received insulin, glucose, bicarb.  Past Medical History  Diagnosis Date  . Type II or unspecified type diabetes mellitus without mention of complication, not stated as uncontrolled   . Esophageal reflux   . DJD (degenerative joint disease)   . Depressive disorder, not elsewhere classified   . Unspecified essential hypertension   . Chronic cough   . Renal insufficiency   . CHF (congestive heart failure)     with well preserved EF   . HTN (hypertension)   . HLD (hyperlipidemia)   . Shortness of breath   . Anxiety   . Anemia     Past Surgical History  Procedure Date  . Insertion of dialysis catheter     Family History  Problem Relation Age of Onset  . Heart disease Father   .  Cancer Sister   . Hypertension    . Diabetes      Social History:  reports that she quit smoking about 4 years ago. Her smoking use included Cigarettes. She has a 30 pack-year smoking history. She does not have any smokeless tobacco history on file. She reports that she does not drink alcohol or use illicit drugs.  Allergies:  Allergies  Allergen Reactions  . Codeine Hives and Itching    Medications:  Prior to Admission medications   Medication Sig Start Date End Date Taking? Authorizing Provider  albuterol (PROVENTIL HFA;VENTOLIN HFA) 108 (90 BASE) MCG/ACT inhaler Inhale 2 puffs into the lungs every 6 (six) hours as needed. For wheezing  08/23/11 08/22/12 Yes Leslye Peer, MD  aspirin 81 MG tablet Take 81 mg by mouth daily.     Yes Historical Provider, MD  atorvastatin (LIPITOR) 40 MG tablet Take 1 tablet by mouth every morning.  02/25/11  Yes Historical Provider, MD  calcium acetate (PHOSLO) 667 MG capsule Take 1 tablet by mouth 3 (three) times daily with meals.  02/09/11  Yes Historical Provider, MD  carvedilol (COREG) 6.25 MG tablet Take 6.25 mg by mouth 2 (two) times daily with a meal.     Yes Historical Provider, MD  Cholecalciferol (VITAMIN D3) 5000 UNITS CAPS Take 2 capsules by mouth daily.   Yes Historical Provider, MD  citalopram (CELEXA) 40 MG tablet Take 1 tablet by mouth daily. 02/18/11  Yes Historical Provider, MD  cloNIDine (CATAPRES) 0.3 MG tablet Take 0.3 mg by mouth 2 (two) times daily. Take 1/2 two times a day   Yes Historical Provider, MD  diltiazem (TIAZAC) 360 MG 24 hr capsule Take 1 tablet by mouth every morning.  02/25/11  Yes Historical Provider, MD  furosemide (LASIX) 80 MG tablet Take 160 mg by mouth 2 (two) times daily.    Yes Historical Provider, MD  insulin glargine (LANTUS) 100 UNIT/ML injection Inject into the skin. 60 units every morning   Yes Historical Provider, MD  insulin lispro (HUMALOG) 100 UNIT/ML injection Inject 14-26 Units into the skin 3 (three) times daily. Sliding scale as directed: 70-150=14 units, 151-200=17 units, 210-250=20 units, 251-300=23 units & >300=26 units   Yes Historical Provider, MD  loratadine (CLARITIN) 10 MG tablet Take 10 mg by mouth daily.   Yes Historical Provider, MD  metolazone (ZAROXOLYN) 5 MG tablet Take 5 mg by mouth every Monday, Wednesday, and Friday.    Yes Historical Provider, MD  potassium chloride SA (K-DUR,KLOR-CON) 20 MEQ tablet Take 40 mEq by mouth 2 (two) times daily. 2 twice daily   Yes Historical Provider, MD  pregabalin (LYRICA) 50 MG capsule Take 50 mg by mouth at bedtime.    Yes Historical Provider, MD  triamcinolone (NASACORT) 55 MCG/ACT nasal inhaler  Place 2 sprays into the nose daily as needed. For nasal congestion   Yes Historical Provider, MD  vitamin B-12 (CYANOCOBALAMIN) 1000 MCG tablet Take 1,000 mcg by mouth at bedtime.    Yes Historical Provider, MD    A comprehensive review of systems was negative.  Vital signs for last 24 hours: Temp:  [97.3 F (36.3 C)-98.3 F (36.8 C)] 98.3 F (36.8 C) (01/21 0016) Pulse Rate:  [32-103] 46  (01/21 0045) Resp:  [15-26] 17  (01/21 0045) BP: (91-154)/(39-74) 135/54 mmHg (01/21 0045) SpO2:  [89 %-100 %] 96 % (01/21 0045) Weight:  [98.7 kg (217 lb 9.5 oz)] 98.7 kg (217 lb 9.5 oz) (01/21 0017)  Physical Exam:  General: alert and no respiratory distress Neuro: alert, oriented and nonfocal exam HEENT/Neck: no JVD, PERRL and no lymphadenopathy, oropharynx clear Resp: clear to auscultation bilaterally CVS: regular rate and rhythm, S1, S2 normal, no murmur, click, rub or gallop GI: soft, nontender, BS WNL, no r/g Skin: no rash Extremities: no edema, no erythema, pulses WNL  LAB RESULT Results for orders placed during the hospital encounter of 12/11/11 (from the past 24 hour(s))  LIPASE, BLOOD     Status: Normal   Collection Time   12/11/11  6:46 PM      Component Value Range   Lipase 14  11 - 59 (U/L)  PROTIME-INR     Status: Abnormal   Collection Time   12/11/11  6:46 PM      Component Value Range   Prothrombin Time 15.5 (*) 11.6 - 15.2 (seconds)   INR 1.20  0.00 - 1.49   CBC     Status: Abnormal   Collection Time   12/11/11  6:46 PM      Component Value Range   WBC 12.5 (*) 4.0 - 10.5 (K/uL)   RBC 3.84 (*) 3.87 - 5.11 (MIL/uL)   Hemoglobin 11.2 (*) 12.0 - 15.0 (g/dL)   HCT 46.9  62.9 - 52.8 (%)   MCV 94.8  78.0 - 100.0 (fL)   MCH 29.2  26.0 - 34.0 (pg)   MCHC 30.8  30.0 - 36.0 (g/dL)   RDW 41.3 (*) 24.4 - 15.5 (%)   Platelets 226  150 - 400 (K/uL)  DIFFERENTIAL     Status: Abnormal   Collection Time   12/11/11  6:46 PM      Component Value Range   Neutrophils Relative 86  (*) 43 - 77 (%)   Neutro Abs 10.7 (*) 1.7 - 7.7 (K/uL)   Lymphocytes Relative 11 (*) 12 - 46 (%)   Lymphs Abs 1.4  0.7 - 4.0 (K/uL)   Monocytes Relative 4  3 - 12 (%)   Monocytes Absolute 0.4  0.1 - 1.0 (K/uL)   Eosinophils Relative 0  0 - 5 (%)   Eosinophils Absolute 0.0  0.0 - 0.7 (K/uL)   Basophils Relative 0  0 - 1 (%)   Basophils Absolute 0.0  0.0 - 0.1 (K/uL)  TROPONIN I     Status: Normal   Collection Time   12/11/11  6:46 PM      Component Value Range   Troponin I <0.30  <0.30 (ng/mL)  POCT I-STAT TROPONIN I     Status: Normal   Collection Time   12/11/11  6:53 PM      Component Value Range   Troponin i, poc 0.05  0.00 - 0.08 (ng/mL)   Comment 3           POCT I-STAT, CHEM 8     Status: Abnormal   Collection Time   12/11/11  6:55 PM      Component Value Range   Sodium 133 (*) 135 - 145 (mEq/L)   Potassium 8.3 (*) 3.5 - 5.1 (mEq/L)   Chloride 111  96 - 112 (mEq/L)   BUN 102 (*) 6 - 23 (mg/dL)   Creatinine, Ser 0.10 (*) 0.50 - 1.10 (mg/dL)   Glucose, Bld 272 (*) 70 - 99 (mg/dL)   Calcium, Ion 5.36  6.44 - 1.32 (mmol/L)   TCO2 20  0 - 100 (mmol/L)   Hemoglobin 12.9  12.0 - 15.0 (g/dL)   HCT 03.4  74.2 - 59.5 (%)  Comment NOTIFIED PHYSICIAN    LACTIC ACID, PLASMA     Status: Abnormal   Collection Time   12/11/11  6:58 PM      Component Value Range   Lactic Acid, Venous 4.8 (*) 0.5 - 2.2 (mmol/L)  PRO B NATRIURETIC PEPTIDE     Status: Abnormal   Collection Time   12/11/11  6:58 PM      Component Value Range   Pro B Natriuretic peptide (BNP) 5876.0 (*) 0 - 125 (pg/mL)  BASIC METABOLIC PANEL     Status: Abnormal   Collection Time   12/11/11  8:23 PM      Component Value Range   Sodium 132 (*) 135 - 145 (mEq/L)   Potassium 7.9 (*) 3.5 - 5.1 (mEq/L)   Chloride 101  96 - 112 (mEq/L)   CO2 18 (*) 19 - 32 (mEq/L)   Glucose, Bld 385 (*) 70 - 99 (mg/dL)   BUN 93 (*) 6 - 23 (mg/dL)   Creatinine, Ser 6.21 (*) 0.50 - 1.10 (mg/dL)   Calcium 9.0  8.4 - 30.8 (mg/dL)   GFR  calc non Af Amer 8 (*) >90 (mL/min)   GFR calc Af Amer 9 (*) >90 (mL/min)  HEPATIC FUNCTION PANEL     Status: Abnormal   Collection Time   12/11/11  8:23 PM      Component Value Range   Total Protein 6.6  6.0 - 8.3 (g/dL)   Albumin 3.0 (*) 3.5 - 5.2 (g/dL)   AST 657 (*) 0 - 37 (U/L)   ALT 206 (*) 0 - 35 (U/L)   Alkaline Phosphatase 105  39 - 117 (U/L)   Total Bilirubin 0.5  0.3 - 1.2 (mg/dL)   Bilirubin, Direct 0.3  0.0 - 0.3 (mg/dL)   Indirect Bilirubin 0.2 (*) 0.3 - 0.9 (mg/dL)  GLUCOSE, CAPILLARY     Status: Abnormal   Collection Time   12/12/11 12:04 AM      Component Value Range   Glucose-Capillary 341 (*) 70 - 99 (mg/dL)     Assessment/Plan:   NEURO  No issues   Plan: Monitor for confusion after dialysis  PULM  No issues   Plan: On stable Home O2  CARDIO  Congestive Heart Failure (diastolic)   Plan: Monitor fluid status prior to HD  RENAL  Chronic Renal Insufficiency and ESRD (end stage renal disease)   Plan: Getting HD urgently  GI  No issues   Plan: Renal diet  ID  Not infected   Plan: No abx  HEME  Anemia anemia of renal disease)   Plan: Continue home meds  ENDO Diabetes Mellitus (Type 2)    Plan: SSI, continue home lantus  Global Issues      LOS: 1 day   Kei Mcelhiney J 12/11/2011

## 2011-12-11 NOTE — ED Provider Notes (Signed)
6:40 PM  I performed a history and physical examination of [name] and discussed her management with Dr. Meredith Pel.  I agree with the history, physical, assessment, and plan of care, with the following exceptions: None  The patient presents for evaluation of generalized weakness, near-syncope, upper abdominal pain, nausea. She is on multiple antihypertensives including beta blocker and calcium channel blocker. She presents with a heart rate of approximately 44 beats per minute in an apparent atrial fibrillation with slow ventricular response. She has a systolic murmur on auscultation of the chest and irregular heart rate. Lungs are clear to auscultation in all fields. The patient is awake, alert, and oriented appropriately.  I was present for the following procedures: None Time Spent in Critical Care of the patient: None Time spent in discussions with the patient and family: 5 minutes  [name]    Felisa Bonier, MD 12/11/11 [phone]

## 2011-12-11 NOTE — ED Notes (Signed)
Per MD, do not hold any of the meds.

## 2011-12-11 NOTE — ED Notes (Signed)
Family at bedside. 

## 2011-12-11 NOTE — ED Notes (Signed)
Lab at bedside for blood draw.

## 2011-12-11 NOTE — ED Notes (Signed)
Nephrologist at bedside for consult.

## 2011-12-11 NOTE — ED Notes (Signed)
Called respiratory for updraft per MAR.

## 2011-12-11 NOTE — ED Notes (Signed)
Called and gave report to Carson.

## 2011-12-12 ENCOUNTER — Inpatient Hospital Stay (HOSPITAL_COMMUNITY): Payer: Medicare Other

## 2011-12-12 ENCOUNTER — Encounter (HOSPITAL_COMMUNITY): Payer: Self-pay

## 2011-12-12 DIAGNOSIS — I498 Other specified cardiac arrhythmias: Secondary | ICD-10-CM

## 2011-12-12 DIAGNOSIS — E875 Hyperkalemia: Secondary | ICD-10-CM

## 2011-12-12 DIAGNOSIS — N179 Acute kidney failure, unspecified: Secondary | ICD-10-CM

## 2011-12-12 LAB — CBC
HCT: 33.5 % — ABNORMAL LOW (ref 36.0–46.0)
MCH: 29.3 pg (ref 26.0–34.0)
MCHC: 31.6 g/dL (ref 30.0–36.0)
MCV: 92.5 fL (ref 78.0–100.0)
RDW: 17.5 % — ABNORMAL HIGH (ref 11.5–15.5)

## 2011-12-12 LAB — RENAL FUNCTION PANEL
BUN: 73 mg/dL — ABNORMAL HIGH (ref 6–23)
CO2: 23 mEq/L (ref 19–32)
Calcium: 9 mg/dL (ref 8.4–10.5)
Creatinine, Ser: 4.15 mg/dL — ABNORMAL HIGH (ref 0.50–1.10)
Glucose, Bld: 227 mg/dL — ABNORMAL HIGH (ref 70–99)
Phosphorus: 6.4 mg/dL — ABNORMAL HIGH (ref 2.3–4.6)
Sodium: 141 mEq/L (ref 135–145)

## 2011-12-12 LAB — BASIC METABOLIC PANEL
BUN: 96 mg/dL — ABNORMAL HIGH (ref 6–23)
Chloride: 99 mEq/L (ref 96–112)
GFR calc Af Amer: 9 mL/min — ABNORMAL LOW (ref >90)
Potassium: 7.5 mEq/L (ref 3.5–5.1)
Sodium: 131 mEq/L — ABNORMAL LOW (ref 135–145)

## 2011-12-12 LAB — PHOSPHORUS: Phosphorus: 6.3 mg/dL — ABNORMAL HIGH (ref 2.3–4.6)

## 2011-12-12 LAB — GLUCOSE, CAPILLARY
Glucose-Capillary: 141 mg/dL — ABNORMAL HIGH (ref 70–99)
Glucose-Capillary: 163 mg/dL — ABNORMAL HIGH (ref 70–99)
Glucose-Capillary: 341 mg/dL — ABNORMAL HIGH (ref 70–99)

## 2011-12-12 LAB — HEPATITIS B CORE ANTIBODY, TOTAL: Hep B Core Total Ab: NEGATIVE

## 2011-12-12 LAB — ALT: ALT: 304 U/L — ABNORMAL HIGH (ref 0–35)

## 2011-12-12 MED ORDER — CITALOPRAM HYDROBROMIDE 40 MG PO TABS
40.0000 mg | ORAL_TABLET | Freq: Every day | ORAL | Status: DC
  Administered 2011-12-12 – 2011-12-14 (×3): 40 mg via ORAL
  Filled 2011-12-12 (×4): qty 1

## 2011-12-12 MED ORDER — SODIUM CHLORIDE 0.9 % IV SOLN: 250.0000 mL | INTRAVENOUS | Status: DC | PRN

## 2011-12-12 MED ORDER — FUROSEMIDE 80 MG PO TABS
160.0000 mg | ORAL_TABLET | Freq: Two times a day (BID) | ORAL | Status: DC
  Administered 2011-12-12 – 2011-12-14 (×6): 160 mg via ORAL
  Filled 2011-12-12 (×10): qty 2

## 2011-12-12 MED ORDER — PARICALCITOL 5 MCG/ML IV SOLN
INTRAVENOUS | Status: AC
  Administered 2011-12-12: 1 ug
  Filled 2011-12-12: qty 1

## 2011-12-12 MED ORDER — SODIUM CHLORIDE 0.9 % IJ SOLN
3.0000 mL | Freq: Two times a day (BID) | INTRAMUSCULAR | Status: DC
  Administered 2011-12-12 – 2011-12-13 (×3): 3 mL via INTRAVENOUS

## 2011-12-12 MED ORDER — VITAMIN D 1000 UNITS PO TABS
10000.0000 [IU] | ORAL_TABLET | Freq: Every day | ORAL | Status: DC
  Administered 2011-12-12 – 2011-12-14 (×3): 10000 [IU] via ORAL
  Filled 2011-12-12 (×4): qty 10

## 2011-12-12 MED ORDER — LORATADINE 10 MG PO TABS
10.0000 mg | ORAL_TABLET | Freq: Every day | ORAL | Status: DC
  Administered 2011-12-12 – 2011-12-14 (×3): 10 mg via ORAL
  Filled 2011-12-12 (×4): qty 1

## 2011-12-12 MED ORDER — CALCIUM ACETATE 667 MG PO CAPS
667.0000 mg | ORAL_CAPSULE | Freq: Three times a day (TID) | ORAL | Status: DC
  Administered 2011-12-12 – 2011-12-14 (×9): 667 mg via ORAL
  Filled 2011-12-12 (×14): qty 1

## 2011-12-12 MED ORDER — FAMOTIDINE 20 MG PO TABS
20.0000 mg | ORAL_TABLET | Freq: Two times a day (BID) | ORAL | Status: DC
Start: 2011-12-12 — End: 2011-12-13
  Administered 2011-12-12 (×3): 20 mg via ORAL
  Filled 2011-12-12 (×5): qty 1

## 2011-12-12 MED ORDER — PARICALCITOL 1 MCG PO CAPS
1.0000 ug | ORAL_CAPSULE | ORAL | Status: DC
  Administered 2011-12-14: 1 ug via ORAL
  Filled 2011-12-12: qty 1

## 2011-12-12 MED ORDER — INSULIN GLARGINE 100 UNIT/ML ~~LOC~~ SOLN
60.0000 [IU] | Freq: Every day | SUBCUTANEOUS | Status: DC
  Administered 2011-12-12: 60 [IU] via SUBCUTANEOUS
  Filled 2011-12-12: qty 3

## 2011-12-12 MED ORDER — ASPIRIN 81 MG PO CHEW
81.0000 mg | CHEWABLE_TABLET | Freq: Every day | ORAL | Status: DC
  Administered 2011-12-12 – 2011-12-14 (×3): 81 mg via ORAL
  Filled 2011-12-12 (×4): qty 1

## 2011-12-12 MED ORDER — HEPARIN SODIUM (PORCINE) 5000 UNIT/ML IJ SOLN
5000.0000 [IU] | Freq: Three times a day (TID) | INTRAMUSCULAR | Status: DC
  Administered 2011-12-12 – 2011-12-15 (×10): 5000 [IU] via SUBCUTANEOUS
  Filled 2011-12-12 (×13): qty 1

## 2011-12-12 MED ORDER — CLONIDINE HCL 0.3 MG PO TABS
0.3000 mg | ORAL_TABLET | Freq: Two times a day (BID) | ORAL | Status: DC
  Administered 2011-12-12 – 2011-12-14 (×7): 0.3 mg via ORAL
  Filled 2011-12-12 (×9): qty 1

## 2011-12-12 MED ORDER — CHLORHEXIDINE GLUCONATE CLOTH 2 % EX PADS
6.0000 | MEDICATED_PAD | Freq: Every day | CUTANEOUS | Status: DC
  Administered 2011-12-12 – 2011-12-15 (×4): 6 via TOPICAL

## 2011-12-12 MED ORDER — ROSUVASTATIN CALCIUM 20 MG PO TABS
20.0000 mg | ORAL_TABLET | Freq: Every day | ORAL | Status: DC
  Administered 2011-12-12 – 2011-12-14 (×3): 20 mg via ORAL
  Filled 2011-12-12 (×4): qty 1

## 2011-12-12 MED ORDER — ALBUTEROL SULFATE HFA 108 (90 BASE) MCG/ACT IN AERS
2.0000 | INHALATION_SPRAY | Freq: Four times a day (QID) | RESPIRATORY_TRACT | Status: DC | PRN
  Filled 2011-12-12: qty 6.7

## 2011-12-12 MED ORDER — VITAMIN B-12 1000 MCG PO TABS
1000.0000 ug | ORAL_TABLET | Freq: Every day | ORAL | Status: DC
  Administered 2011-12-12 – 2011-12-14 (×3): 1000 ug via ORAL
  Filled 2011-12-12 (×4): qty 1

## 2011-12-12 MED ORDER — FLUTICASONE PROPIONATE 50 MCG/ACT NA SUSP
1.0000 | Freq: Every day | NASAL | Status: DC
  Administered 2011-12-12 – 2011-12-14 (×3): 1 via NASAL
  Filled 2011-12-12: qty 16

## 2011-12-12 MED ORDER — MUPIROCIN 2 % EX OINT
1.0000 "application " | TOPICAL_OINTMENT | Freq: Two times a day (BID) | CUTANEOUS | Status: DC
  Administered 2011-12-12 – 2011-12-14 (×7): 1 via NASAL
  Filled 2011-12-12 (×2): qty 22

## 2011-12-12 MED ORDER — INSULIN ASPART 100 UNIT/ML ~~LOC~~ SOLN
0.0000 [IU] | Freq: Three times a day (TID) | SUBCUTANEOUS | Status: DC
  Administered 2011-12-12: 2 [IU] via SUBCUTANEOUS
  Administered 2011-12-12: 8 [IU] via SUBCUTANEOUS
  Administered 2011-12-13: 0 [IU] via SUBCUTANEOUS
  Administered 2011-12-13 (×2): 2 [IU] via SUBCUTANEOUS
  Administered 2011-12-14: 15 [IU] via SUBCUTANEOUS
  Administered 2011-12-14: 8 [IU] via SUBCUTANEOUS
  Administered 2011-12-15: 5 [IU] via SUBCUTANEOUS
  Filled 2011-12-12: qty 3

## 2011-12-12 MED ORDER — SODIUM CHLORIDE 0.9 % IJ SOLN: 3.0000 mL | INTRAMUSCULAR | Status: DC | PRN

## 2011-12-12 NOTE — Progress Notes (Signed)
Subjective:  Patient received HD early this AM for hyperkalemia, recheck of K is pending this AM.  York Spaniel was in USOH until Sunday, was on K repletion as OP Objective Vital signs in last 24 hours: Filed Vitals:   12/12/11 0430 12/12/11 0500 12/12/11 0615 12/12/11 0700  BP: 168/61 163/67 157/62 167/66  Pulse: 66 61 66 64  Temp:      TempSrc:      Resp: 15 15 14 14   Height:      Weight:   96.4 kg (212 lb 8.4 oz)   SpO2: 100% 98% 100% 100%   Weight change:   Intake/Output Summary (Last 24 hours) at 12/12/11 0803 Last data filed at 12/12/11 0600  Gross per 24 hour  Intake  52.67 ml  Output   2097 ml  Net -2044.33 ml   Labs: Basic Metabolic Panel:  Lab 12/12/11 1610 12/11/11 2023 12/11/11 1855  NA 131* 132* 133*  K 7.5* 7.9* 8.3*  CL 99 101 111  CO2 19 18* --  GLUCOSE 336* 385* 320*  BUN 96* 93* 102*  CREATININE 5.20* 5.16* 5.20*  CALCIUM 9.0 9.0 --  ALB -- -- --  PHOS 6.3* -- --   Liver Function Tests:  Lab 12/12/11 0117 12/11/11 2023  AST -- 143*  ALT 304* 206*  ALKPHOS -- 105  BILITOT -- 0.5  PROT -- 6.6  ALBUMIN -- 3.0*    Lab 12/11/11 1846  LIPASE 14  AMYLASE --   No results found for this basename: AMMONIA:3 in the last 168 hours CBC:  Lab 12/12/11 0645 12/11/11 1855 12/11/11 1846 12/09/11 2240  WBC 10.9* -- 12.5* 8.7  NEUTROABS -- -- 10.7* 6.5  HGB 10.6* 12.9 11.2* --  HCT 33.5* 38.0 36.4 --  MCV 92.5 -- 94.8 93.3  PLT 182 -- 226 177   Cardiac Enzymes:  Lab 12/11/11 1846 12/09/11 2240  CKTOTAL -- 116  CKMB -- 3.9  CKMBINDEX -- --  TROPONINI <0.30 <0.30   CBG:  Lab 12/12/11 0004  GLUCAP 341*    Iron Studies: No results found for this basename: IRON,TIBC,TRANSFERRIN,FERRITIN in the last 72 hours Studies/Results: Dg Chest Portable 1 View  12/11/2011  *RADIOLOGY REPORT*  Clinical Data: Bradycardia.  Cough and congestion.  PORTABLE CHEST - 1 VIEW  Comparison: PA and lateral chest 12/09/2011.  Findings: There is cardiomegaly with some  vascular congestion.  No pneumothorax or effusion.  No consolidative process.  IMPRESSION: Cardiomegaly and pulmonary vascular congestion.  Original Report Authenticated By: Bernadene Bell. Maricela Curet, M.D.   Medications: Infusions:    Scheduled Medications:    . albuterol  10 mg/hr Nebulization Once  . aspirin  81 mg Oral Daily  . aspirin  325 mg Oral Once  . atropine  0.5 mg Intravenous Once  . atropine      . calcium acetate  667 mg Oral TID WC  . calcium gluconate  1 g Intravenous To Major  . calcium gluconate      . Chlorhexidine Gluconate Cloth  6 each Topical Q0600  . cholecalciferol  10,000 Units Oral Daily  . citalopram  40 mg Oral Daily  . cloNIDine  0.3 mg Oral BID  . dextrose  25 g Intravenous Once  . famotidine  20 mg Oral BID  . fluticasone  1 spray Each Nare Daily  . furosemide  80 mg Intravenous Once  . glucagon  1 mg Intravenous Once  . heparin  5,000 Units Subcutaneous Q8H  . insulin aspart  10 Units Intravenous To Major  . insulin aspart  5 Units Intravenous Once  . insulin glargine  60 Units Subcutaneous Daily  . loratadine  10 mg Oral Daily  . mupirocin ointment  1 application Nasal BID  . ondansetron (ZOFRAN) IV  4 mg Intravenous Once  . paricalcitol      . paricalcitol  1 mcg Oral Q M,W,F  . rosuvastatin  20 mg Oral Daily  . sodium bicarbonate  50 mEq Intravenous Once  . sodium chloride  500 mL Intravenous Once  . sodium chloride      . sodium chloride  3 mL Intravenous Q12H  . sodium polystyrene  30 g Oral Once  . vitamin B-12  1,000 mcg Oral QHS  . DISCONTD: aspirin  325 mg Oral Once  . DISCONTD: insulin aspart  5 Units Subcutaneous Once  . DISCONTD: insulin regular  10 Units Intravenous To Major  . DISCONTD: insulin regular  5 Units Subcutaneous Once    have reviewed scheduled and prn medications.  Physical Exam: General: sleepy but when aroused , NAD Heart: RRR Lungs: mostly clear Abdomen: soft, NT Extremities: minimal edema Dialysis Access:  L UE AVF, bandaged but no obvious bruising   I Assessment/ Plan: Pt is [name] 66 y.o. yo female who was admitted on 12/11/2011 with  Hyperkalemia in the setting of advanced CKD and K repletion  Assessment/Plan: 1. Hyperkalemia- hopefully just due to CKD and potassium repletion.  Unsure if this means that she will require chronic hemodialysis from this date forward.  Awaiting repeat K level this AM.  Obviously holding K supplement and restart diuretics 2. BP/vol- BP still overall high, continue home meds as well as diuretics 3. Anemia- hgb dropped from admit, not on ESA as OP, will follow 4. Secondary hyperparathyroidism- on phoslo and zemplar as OP 5.  Will follow up on labs today and check labs in AM in order to determine if further HD will be needed.  Patient did not report any uremic symptoms BUT had BUN in the 90's and has [name] relatively low albumin which can be signs of uremia.    [name]   12/12/2011,8:03 AM  LOS: 1 day

## 2011-12-12 NOTE — Progress Notes (Signed)
12/12/11 0015 Pt arrived to unit vs stable 113/51 hr 49. CBG = 341.  Daughter at bedside. Awaiting to start HD.  Will continue to monitor. [name]

## 2011-12-12 NOTE — Progress Notes (Signed)
Inpatient Diabetes Program Recommendations  AACE/ADA: New Consensus Statement on Inpatient Glycemic Control (2009)  Target Ranges:  Prepandial:   less than 140 mg/dL      Peak postprandial:   less than 180 mg/dL (1-2 hours)      Critically ill patients:  140 - 180 mg/dL   Reason for Visit: Hyperglycemia  Inpatient Diabetes Program Recommendations Correction (SSI): Request MD order for Novolog correction scale.  Request MD order for CBG's ac and hs.  Note: American Diabetes Association recommends hospitalized patients have CBG's checked at least four times daily while in the hospital.  (ac & hs when eating and every four hours if NPO)

## 2011-12-12 NOTE — Progress Notes (Signed)
12/12/11 0115 Lab called with positive MRSA swab. Placed on contact. Will continue to monitor. Emilie [place]

## 2011-12-12 NOTE — Progress Notes (Signed)
12/12/11 0230 Critical potassium called from lab of 7.5 prior to HD.  Will recheck once HD completed. VS stable will continue to monitor. Emilie [place] Liter

## 2011-12-12 NOTE — H&P (Signed)
Patient name: [name] Medical record number: [id number] Date of birth: 09/05/1946 Age: 66 y.o. Gender: female PCP: Nadean Corwin, MD, MD  Date: 12/11/2011  Patient Details:    [name] is a 66 y.o. female with ESRD, now hyperkalemia.  Admission for urgent new dialysis.  Lines/tubes :   Microbiology/Sepsis markers: Results for orders placed during the hospital encounter of 12/11/11  MRSA PCR SCREENING     Status: Abnormal   Collection Time   12/11/11 11:55 PM      Component Value Range Status Comment   MRSA by PCR POSITIVE (*) NEGATIVE  Final     Anti-infectives:  Anti-infectives    None      Best Practice/Protocols:  VTE Prophylaxis: Heparin (SQ) GI Prophylaxis: Antihistamine   Consults: Treatment Team:  Cecille Aver, MDNephrology    Studies: Dg Chest 2 View  12/10/2011  *RADIOLOGY REPORT*  Clinical Data: Shortness of breath.  History of CHF.  CHEST - 2 VIEW  Comparison: 06/15/2011  Findings: There is cardiomegaly with vascular congestion.  No definite overt edema.  No focal opacities or effusions.  No acute bony abnormality.  IMPRESSION: Cardiomegaly, vascular congestion.  Original Report Authenticated By: Cyndie Chime, M.D.   Dg Chest Portable 1 View  12/11/2011  *RADIOLOGY REPORT*  Clinical Data: Bradycardia.  Cough and congestion.  PORTABLE CHEST - 1 VIEW  Comparison: PA and lateral chest 12/09/2011.  Findings: There is cardiomegaly with some vascular congestion.  No pneumothorax or effusion.  No consolidative process.  IMPRESSION: Cardiomegaly and pulmonary vascular congestion.  Original Report Authenticated By: Bernadene Bell. D'ALESSIO, M.D.   Dg Foot Complete Right  12/10/2011  *RADIOLOGY REPORT*  Clinical Data: Pain, swelling.  RIGHT FOOT COMPLETE - 3+ VIEW  Comparison: None.  Findings: No acute bony abnormality.  Specifically, no fracture, subluxation, or dislocation.  Soft tissues are intact.  Plantar calcaneal spur.  IMPRESSION: No  acute bony abnormality.  Original Report Authenticated By: Cyndie Chime, M.D.    Events: 1/20- hyper K , emergen HD, K wnl  Vital signs for last 24 hours: Temp:  [97.3 F (36.3 C)-98.3 F (36.8 C)] 97.9 F (36.6 C) (01/21 0400) Pulse Rate:  [32-103] 65  (01/21 0800) Resp:  [14-26] 15  (01/21 0800) BP: (91-176)/(39-74) 176/63 mmHg (01/21 0800) SpO2:  [89 %-100 %] 100 % (01/21 0800) Weight:  [96.4 kg (212 lb 8.4 oz)-98.7 kg (217 lb 9.5 oz)] 96.4 kg (212 lb 8.4 oz) (01/21 0615)  Physical Exam:  .General: awake, reading morning paper Neuro: nonfocal HEENT: jvd improved PULM:cta reduced bases CV: s1 s2 rrr no m GI: soft Bs wnl no r Extremities: no edema   LAB RESULT Results for orders placed during the hospital encounter of 12/11/11 (from the past 24 hour(s))  LIPASE, BLOOD     Status: Normal   Collection Time   12/11/11  6:46 PM      Component Value Range   Lipase 14  11 - 59 (U/L)  PROTIME-INR     Status: Abnormal   Collection Time   12/11/11  6:46 PM      Component Value Range   Prothrombin Time 15.5 (*) 11.6 - 15.2 (seconds)   INR 1.20  0.00 - 1.49   CBC     Status: Abnormal   Collection Time   12/11/11  6:46 PM      Component Value Range   WBC 12.5 (*) 4.0 - 10.5 (K/uL)   RBC 3.84 (*) 3.87 -  5.11 (MIL/uL)   Hemoglobin 11.2 (*) 12.0 - 15.0 (g/dL)   HCT 16.1  09.6 - 04.5 (%)   MCV 94.8  78.0 - 100.0 (fL)   MCH 29.2  26.0 - 34.0 (pg)   MCHC 30.8  30.0 - 36.0 (g/dL)   RDW 40.9 (*) 81.1 - 15.5 (%)   Platelets 226  150 - 400 (K/uL)  DIFFERENTIAL     Status: Abnormal   Collection Time   12/11/11  6:46 PM      Component Value Range   Neutrophils Relative 86 (*) 43 - 77 (%)   Neutro Abs 10.7 (*) 1.7 - 7.7 (K/uL)   Lymphocytes Relative 11 (*) 12 - 46 (%)   Lymphs Abs 1.4  0.7 - 4.0 (K/uL)   Monocytes Relative 4  3 - 12 (%)   Monocytes Absolute 0.4  0.1 - 1.0 (K/uL)   Eosinophils Relative 0  0 - 5 (%)   Eosinophils Absolute 0.0  0.0 - 0.7 (K/uL)   Basophils  Relative 0  0 - 1 (%)   Basophils Absolute 0.0  0.0 - 0.1 (K/uL)  TROPONIN I     Status: Normal   Collection Time   12/11/11  6:46 PM      Component Value Range   Troponin I <0.30  <0.30 (ng/mL)  POCT I-STAT TROPONIN I     Status: Normal   Collection Time   12/11/11  6:53 PM      Component Value Range   Troponin i, poc 0.05  0.00 - 0.08 (ng/mL)   Comment 3           POCT I-STAT, CHEM 8     Status: Abnormal   Collection Time   12/11/11  6:55 PM      Component Value Range   Sodium 133 (*) 135 - 145 (mEq/L)   Potassium 8.3 (*) 3.5 - 5.1 (mEq/L)   Chloride 111  96 - 112 (mEq/L)   BUN 102 (*) 6 - 23 (mg/dL)   Creatinine, Ser 9.14 (*) 0.50 - 1.10 (mg/dL)   Glucose, Bld 782 (*) 70 - 99 (mg/dL)   Calcium, Ion 9.56  2.13 - 1.32 (mmol/L)   TCO2 20  0 - 100 (mmol/L)   Hemoglobin 12.9  12.0 - 15.0 (g/dL)   HCT 08.6  57.8 - 46.9 (%)   Comment NOTIFIED PHYSICIAN    LACTIC ACID, PLASMA     Status: Abnormal   Collection Time   12/11/11  6:58 PM      Component Value Range   Lactic Acid, Venous 4.8 (*) 0.5 - 2.2 (mmol/L)  PRO B NATRIURETIC PEPTIDE     Status: Abnormal   Collection Time   12/11/11  6:58 PM      Component Value Range   Pro B Natriuretic peptide (BNP) 5876.0 (*) 0 - 125 (pg/mL)  BASIC METABOLIC PANEL     Status: Abnormal   Collection Time   12/11/11  8:23 PM      Component Value Range   Sodium 132 (*) 135 - 145 (mEq/L)   Potassium 7.9 (*) 3.5 - 5.1 (mEq/L)   Chloride 101  96 - 112 (mEq/L)   CO2 18 (*) 19 - 32 (mEq/L)   Glucose, Bld 385 (*) 70 - 99 (mg/dL)   BUN 93 (*) 6 - 23 (mg/dL)   Creatinine, Ser 6.29 (*) 0.50 - 1.10 (mg/dL)   Calcium 9.0  8.4 - 52.8 (mg/dL)   GFR calc non Af Amer 8 (*) >  90 (mL/min)   GFR calc Af Amer 9 (*) >90 (mL/min)  HEPATIC FUNCTION PANEL     Status: Abnormal   Collection Time   12/11/11  8:23 PM      Component Value Range   Total Protein 6.6  6.0 - 8.3 (g/dL)   Albumin 3.0 (*) 3.5 - 5.2 (g/dL)   AST 782 (*) 0 - 37 (U/L)   ALT 206 (*) 0 - 35  (U/L)   Alkaline Phosphatase 105  39 - 117 (U/L)   Total Bilirubin 0.5  0.3 - 1.2 (mg/dL)   Bilirubin, Direct 0.3  0.0 - 0.3 (mg/dL)   Indirect Bilirubin 0.2 (*) 0.3 - 0.9 (mg/dL)  MRSA PCR SCREENING     Status: Abnormal   Collection Time   12/11/11 11:55 PM      Component Value Range   MRSA by PCR POSITIVE (*) NEGATIVE   GLUCOSE, CAPILLARY     Status: Abnormal   Collection Time   12/12/11 12:04 AM      Component Value Range   Glucose-Capillary 341 (*) 70 - 99 (mg/dL)  BASIC METABOLIC PANEL     Status: Abnormal   Collection Time   12/12/11  1:17 AM      Component Value Range   Sodium 131 (*) 135 - 145 (mEq/L)   Potassium 7.5 (*) 3.5 - 5.1 (mEq/L)   Chloride 99  96 - 112 (mEq/L)   CO2 19  19 - 32 (mEq/L)   Glucose, Bld 336 (*) 70 - 99 (mg/dL)   BUN 96 (*) 6 - 23 (mg/dL)   Creatinine, Ser 9.56 (*) 0.50 - 1.10 (mg/dL)   Calcium 9.0  8.4 - 21.3 (mg/dL)   GFR calc non Af Amer 8 (*) >90 (mL/min)   GFR calc Af Amer 9 (*) >90 (mL/min)  MAGNESIUM     Status: Normal   Collection Time   12/12/11  1:17 AM      Component Value Range   Magnesium 2.5  1.5 - 2.5 (mg/dL)  PHOSPHORUS     Status: Abnormal   Collection Time   12/12/11  1:17 AM      Component Value Range   Phosphorus 6.3 (*) 2.3 - 4.6 (mg/dL)  HEPATITIS B CORE ANTIBODY, TOTAL     Status: Normal   Collection Time   12/12/11  1:17 AM      Component Value Range   Hep B Core Total Ab NEGATIVE  NEGATIVE   HEPATITIS B SURFACE ANTIGEN     Status: Normal   Collection Time   12/12/11  1:17 AM      Component Value Range   Hepatitis B Surface Ag NEGATIVE  NEGATIVE   HEPATITIS B SURFACE ANTIBODY     Status: Normal   Collection Time   12/12/11  1:17 AM      Component Value Range   Hep B S Ab NEGATIVE  NEGATIVE   ALT     Status: Abnormal   Collection Time   12/12/11  1:17 AM      Component Value Range   ALT 304 (*) 0 - 35 (U/L)  CBC     Status: Abnormal   Collection Time   12/12/11  6:45 AM      Component Value Range   WBC 10.9 (*)  4.0 - 10.5 (K/uL)   RBC 3.62 (*) 3.87 - 5.11 (MIL/uL)   Hemoglobin 10.6 (*) 12.0 - 15.0 (g/dL)   HCT 08.6 (*) 57.8 - 46.0 (%)  MCV 92.5  78.0 - 100.0 (fL)   MCH 29.3  26.0 - 34.0 (pg)   MCHC 31.6  30.0 - 36.0 (g/dL)   RDW 78.2 (*) 95.6 - 15.5 (%)   Platelets 182  150 - 400 (K/uL)  MAGNESIUM     Status: Normal   Collection Time   12/12/11  6:45 AM      Component Value Range   Magnesium 2.3  1.5 - 2.5 (mg/dL)  RENAL FUNCTION PANEL     Status: Abnormal   Collection Time   12/12/11  6:45 AM      Component Value Range   Sodium 141  135 - 145 (mEq/L)   Potassium 4.6  3.5 - 5.1 (mEq/L)   Chloride 102  96 - 112 (mEq/L)   CO2 23  19 - 32 (mEq/L)   Glucose, Bld 227 (*) 70 - 99 (mg/dL)   BUN 73 (*) 6 - 23 (mg/dL)   Creatinine, Ser 2.13 (*) 0.50 - 1.10 (mg/dL)   Calcium 9.0  8.4 - 08.6 (mg/dL)   Phosphorus 6.4 (*) 2.3 - 4.6 (mg/dL)   Albumin 3.1 (*) 3.5 - 5.2 (g/dL)   GFR calc non Af Amer 10 (*) >90 (mL/min)   GFR calc Af Amer 12 (*) >90 (mL/min)     Assessment/Plan:   NEURO  No issues   Plan: to chair  PULM  No issues   Plan: On stable Home O2, reduce to home O2 needs if able, likey can pcxr rewiewed, may need further HD Tx  CARDIO  Congestive Heart Failure (diastolic)   Plan: Monitor fluid status prior to HD, tele dc  RENAL  Chronic Renal Insufficiency and ESRD (end stage renal disease)   Plan: Getting HD urgently, doen 1/20, resolved Per renal  GI  No issues   Plan: Renal diet tolerated ppi can dc if not home med and tolerating diet  ID  Not infected   Plan: No abx  HEME  Anemia anemia of renal disease)   Plan: Continue home meds, no role Tx, cbc in am   ENDO Diabetes Mellitus (Type 2)    Plan: SSI added, continue home lantus  Global Issues   To floor home soon? Will d/w renal   LOS: 1 day   Mikayah Joy J. 12/11/2011

## 2011-12-13 ENCOUNTER — Encounter (HOSPITAL_COMMUNITY): Payer: Medicare Other

## 2011-12-13 DIAGNOSIS — I498 Other specified cardiac arrhythmias: Secondary | ICD-10-CM

## 2011-12-13 DIAGNOSIS — E875 Hyperkalemia: Secondary | ICD-10-CM

## 2011-12-13 DIAGNOSIS — N179 Acute kidney failure, unspecified: Secondary | ICD-10-CM

## 2011-12-13 LAB — RENAL FUNCTION PANEL
BUN: 71 mg/dL — ABNORMAL HIGH (ref 6–23)
Chloride: 101 mEq/L (ref 96–112)
GFR calc Af Amer: 13 mL/min — ABNORMAL LOW (ref >90)
Glucose, Bld: 32 mg/dL — CL (ref 70–99)
Potassium: 3.5 mEq/L (ref 3.5–5.1)
Sodium: 143 mEq/L (ref 135–145)

## 2011-12-13 LAB — CBC
HCT: 32.8 % — ABNORMAL LOW (ref 36.0–46.0)
MCHC: 32 g/dL (ref 30.0–36.0)
RDW: 17.5 % — ABNORMAL HIGH (ref 11.5–15.5)

## 2011-12-13 LAB — DIFFERENTIAL
Basophils Absolute: 0 10*3/uL (ref 0.0–0.1)
Basophils Relative: 0 % (ref 0–1)
Eosinophils Absolute: 0.2 10*3/uL (ref 0.0–0.7)
Monocytes Absolute: 0.9 10*3/uL (ref 0.1–1.0)
Neutro Abs: 6.2 10*3/uL (ref 1.7–7.7)

## 2011-12-13 LAB — GLUCOSE, CAPILLARY: Glucose-Capillary: 54 mg/dL — ABNORMAL LOW (ref 70–99)

## 2011-12-13 MED ORDER — ACETAMINOPHEN 325 MG PO TABS
325.0000 mg | ORAL_TABLET | Freq: Four times a day (QID) | ORAL | Status: DC | PRN
Start: 2011-12-13 — End: 2011-12-15
  Administered 2011-12-13: 325 mg via ORAL
  Filled 2011-12-13: qty 2

## 2011-12-13 MED ORDER — INSULIN GLARGINE 100 UNIT/ML ~~LOC~~ SOLN
40.0000 [IU] | Freq: Every day | SUBCUTANEOUS | Status: DC
  Administered 2011-12-13: 40 [IU] via SUBCUTANEOUS

## 2011-12-13 NOTE — Progress Notes (Signed)
CBG: 54  Treatment: 15 GM carbohydrate snack  Symptoms: None  Follow-up CBG: [id number] CBG Result:195  Possible Reasons for Event: Unknown  Comments/MD notified:yes    Byrd Hesselbach, Shirlyn Goltz

## 2011-12-13 NOTE — Progress Notes (Signed)
UR Completed.  Nathen Balaban Jane [phone] 10/22/2012  

## 2011-12-13 NOTE — Progress Notes (Signed)
Subjective:  Patient has had no significant events overnight, most likely will be transferred out of ICU.  Had nearly 4500 of UOP.  K normalized after one HD treatment early AM 1/21.  Labs pending this AM.  Looks great, ready for breakfast  Objective Vital signs in last 24 hours: Filed Vitals:   12/13/11 0000 12/13/11 0350 12/13/11 0400 12/13/11 0600  BP:  173/54    Pulse: 59 56    Temp: 97.8 F (36.6 C)  98.5 F (36.9 C)   TempSrc: Oral  Oral   Resp: 13 14    Height:      Weight:    94.1 kg (207 lb 7.3 oz)  SpO2: 99% 100%     Weight change: -4.6 kg (-10 lb 2.3 oz)  Intake/Output Summary (Last 24 hours) at 12/13/11 0748 Last data filed at 12/13/11 0500  Gross per 24 hour  Intake    840 ml  Output   4300 ml  Net  -3460 ml   Labs: Basic Metabolic Panel:  Lab 12/12/11 1610 12/12/11 0117 12/11/11 2023  NA 141 131* 132*  K 4.6 7.5* 7.9*  CL 102 99 101  CO2 23 19 18*  GLUCOSE 227* 336* 385*  BUN 73* 96* 93*  CREATININE 4.15* 5.20* 5.16*  CALCIUM 9.0 9.0 9.0  ALB -- -- --  PHOS 6.4* 6.3* --   Liver Function Tests:  Lab 12/12/11 0645 12/12/11 0117 12/11/11 2023  AST -- -- 143*  ALT -- 304* 206*  ALKPHOS -- -- 105  BILITOT -- -- 0.5  PROT -- -- 6.6  ALBUMIN 3.1* -- 3.0*    Lab 12/11/11 1846  LIPASE 14  AMYLASE --   No results found for this basename: AMMONIA:3 in the last 168 hours CBC:  Lab 12/12/11 0645 12/11/11 1855 12/11/11 1846 12/09/11 2240  WBC 10.9* -- 12.5* 8.7  NEUTROABS -- -- 10.7* 6.5  HGB 10.6* 12.9 11.2* --  HCT 33.5* 38.0 36.4 --  MCV 92.5 -- 94.8 93.3  PLT 182 -- 226 177   Cardiac Enzymes:  Lab 12/11/11 1846 12/09/11 2240  CKTOTAL -- 116  CKMB -- 3.9  CKMBINDEX -- --  TROPONINI <0.30 <0.30   CBG:  Lab 12/12/11 2137 12/12/11 1752 12/12/11 1207 12/12/11 0004  GLUCAP 163* 141* 269* 341*    Iron Studies: No results found for this basename: IRON,TIBC,TRANSFERRIN,FERRITIN in the last 72 hours Studies/Results: Dg Chest Portable 1  View  12/11/2011  *RADIOLOGY REPORT*  Clinical Data: Bradycardia.  Cough and congestion.  PORTABLE CHEST - 1 VIEW  Comparison: PA and lateral chest 12/09/2011.  Findings: There is cardiomegaly with some vascular congestion.  No pneumothorax or effusion.  No consolidative process.  IMPRESSION: Cardiomegaly and pulmonary vascular congestion.  Original Report Authenticated By: Bernadene Bell. Maricela Curet, M.D.   Medications: Infusions:    Scheduled Medications:    . aspirin  81 mg Oral Daily  . calcium acetate  667 mg Oral TID WC  . Chlorhexidine Gluconate Cloth  6 each Topical Q0600  . cholecalciferol  10,000 Units Oral Daily  . citalopram  40 mg Oral Daily  . cloNIDine  0.3 mg Oral BID  . famotidine  20 mg Oral BID  . fluticasone  1 spray Each Nare Daily  . furosemide  160 mg Oral BID  . heparin  5,000 Units Subcutaneous Q8H  . insulin aspart  0-15 Units Subcutaneous TID WC  . insulin glargine  60 Units Subcutaneous Daily  . loratadine  10  mg Oral Daily  . mupirocin ointment  1 application Nasal BID  . paricalcitol  1 mcg Oral Q M,W,F  . rosuvastatin  20 mg Oral Daily  . sodium chloride  3 mL Intravenous Q12H  . vitamin B-12  1,000 mcg Oral QHS    have reviewed scheduled and prn medications.  Physical Exam: General: sleepy but when aroused , NAD Heart: RRR Lungs: mostly clear Abdomen: soft, NT Extremities: minimal edema Dialysis Access: L UE AVF, bandaged but no obvious bruising   I Assessment/ Plan: Pt is a 66 y.o. yo female who was admitted on 12/11/2011 with  Hyperkalemia in the setting of advanced CKD and K repletion  Assessment/Plan: 1. Hyperkalemia- hopefully just due to CKD and potassium repletion.  Unsure if this means that she will require chronic hemodialysis from this date forward, s/p one HD treatment in early AM 1/21 with resolution of hyperkalemia.  Obviously holding K supplement and have restarted diuretics 2. BP/vol- BP still overall high, continue home meds as well  as diuretics 3. Anemia- hgb dropped from admit, not on ESA as OP, will follow 4. Secondary hyperparathyroidism- on phoslo and zemplar as OP, am continuing. 5.  Will follow up on labs today and check labs in AM in order to determine if further HD will be needed. Looks great clinically today so hoping further HD will not be needed. Agree with moving to regular floor bed, mobilizing and weaning O2, will d/c foley as well.  Bina Veenstra A   12/13/2011,7:48 AM  LOS: 2 days

## 2011-12-13 NOTE — Progress Notes (Signed)
Patient name: [name] Medical record number: [id number] Date of birth: 09-22-1946 Age: 66 y.o. Gender: female PCP: Nadean Corwin, MD, MD  Date: 12/11/2011  Patient Details:    [name] is a 66 y.o. female with ESRD, now hyperkalemia.  Admission for urgent new dialysis.  Lines/tubes   Microbiology/Sepsis markers: Results for orders placed during the hospital encounter of 12/11/11  MRSA PCR SCREENING     Status: Abnormal   Collection Time   12/11/11 11:55 PM      Component Value Range Status Comment   MRSA by PCR POSITIVE (*) NEGATIVE  Final     Anti-infectives:  Anti-infectives    None      Best Practice/Protocols:  VTE Prophylaxis: Heparin (SQ) GI Prophylaxis: Antihistamine   Consults: Treatment Team:  Cecille Aver, MDNephrology    Studies: Dg Chest 2 View  12/10/2011  *RADIOLOGY REPORT*  Clinical Data: Shortness of breath.  History of CHF.  CHEST - 2 VIEW  Comparison: 06/15/2011  Findings: There is cardiomegaly with vascular congestion.  No definite overt edema.  No focal opacities or effusions.  No acute bony abnormality.  IMPRESSION: Cardiomegaly, vascular congestion.  Original Report Authenticated By: Cyndie Chime, M.D.   Dg Chest Portable 1 View  12/11/2011  *RADIOLOGY REPORT*  Clinical Data: Bradycardia.  Cough and congestion.  PORTABLE CHEST - 1 VIEW  Comparison: PA and lateral chest 12/09/2011.  Findings: There is cardiomegaly with some vascular congestion.  No pneumothorax or effusion.  No consolidative process.  IMPRESSION: Cardiomegaly and pulmonary vascular congestion.  Original Report Authenticated By: Bernadene Bell. D'ALESSIO, M.D.   Dg Foot Complete Right  12/10/2011  *RADIOLOGY REPORT*  Clinical Data: Pain, swelling.  RIGHT FOOT COMPLETE - 3+ VIEW  Comparison: None.  Findings: No acute bony abnormality.  Specifically, no fracture, subluxation, or dislocation.  Soft tissues are intact.  Plantar calcaneal spur.  IMPRESSION: No acute  bony abnormality.  Original Report Authenticated By: Cyndie Chime, M.D.    Events: 1/20- hyper K , emergen HD, K wnl  Vital signs for last 24 hours: Temp:  [97.6 F (36.4 C)-98.5 F (36.9 C)] 98.2 F (36.8 C) (01/22 0823) Pulse Rate:  [56-65] 64  (01/22 0823) Resp:  [13-15] 14  (01/22 0823) BP: (144-173)/(50-69) 144/50 mmHg (01/22 0823) SpO2:  [98 %-100 %] 98 % (01/22 0823) Weight:  [94.1 kg (207 lb 7.3 oz)] 94.1 kg (207 lb 7.3 oz) (01/22 0600)  Physical Exam:  No distress, looks great Neuro: nonfocal HEENT: jvd wnl PULM:cta CV: s1 s2 rrr no m GI: soft Bs wnl no r Extremities: no edema   LAB RESULT Results for orders placed during the hospital encounter of 12/11/11 (from the past 24 hour(s))  GLUCOSE, CAPILLARY     Status: Abnormal   Collection Time   12/12/11 12:07 PM      Component Value Range   Glucose-Capillary 269 (*) 70 - 99 (mg/dL)   Comment 1 Notify RN    GLUCOSE, CAPILLARY     Status: Abnormal   Collection Time   12/12/11  5:52 PM      Component Value Range   Glucose-Capillary 141 (*) 70 - 99 (mg/dL)  GLUCOSE, CAPILLARY     Status: Abnormal   Collection Time   12/12/11  9:37 PM      Component Value Range   Glucose-Capillary 163 (*) 70 - 99 (mg/dL)  RENAL FUNCTION PANEL     Status: Abnormal   Collection Time  12/13/11  7:15 AM      Component Value Range   Sodium 143  135 - 145 (mEq/L)   Potassium 3.5  3.5 - 5.1 (mEq/L)   Chloride 101  96 - 112 (mEq/L)   CO2 30  19 - 32 (mEq/L)   Glucose, Bld 32 (*) 70 - 99 (mg/dL)   BUN 71 (*) 6 - 23 (mg/dL)   Creatinine, Ser 9.60 (*) 0.50 - 1.10 (mg/dL)   Calcium 9.5  8.4 - 45.4 (mg/dL)   Phosphorus 5.3 (*) 2.3 - 4.6 (mg/dL)   Albumin 3.0 (*) 3.5 - 5.2 (g/dL)   GFR calc non Af Amer 11 (*) >90 (mL/min)   GFR calc Af Amer 13 (*) >90 (mL/min)  CBC     Status: Abnormal   Collection Time   12/13/11  7:15 AM      Component Value Range   WBC 8.4  4.0 - 10.5 (K/uL)   RBC 3.55 (*) 3.87 - 5.11 (MIL/uL)   Hemoglobin 10.5  (*) 12.0 - 15.0 (g/dL)   HCT 09.8 (*) 11.9 - 46.0 (%)   MCV 92.4  78.0 - 100.0 (fL)   MCH 29.6  26.0 - 34.0 (pg)   MCHC 32.0  30.0 - 36.0 (g/dL)   RDW 14.7 (*) 82.9 - 15.5 (%)   Platelets 165  150 - 400 (K/uL)  DIFFERENTIAL     Status: Normal   Collection Time   12/13/11  7:15 AM      Component Value Range   Neutrophils Relative 74  43 - 77 (%)   Neutro Abs 6.2  1.7 - 7.7 (K/uL)   Lymphocytes Relative 14  12 - 46 (%)   Lymphs Abs 1.2  0.7 - 4.0 (K/uL)   Monocytes Relative 11  3 - 12 (%)   Monocytes Absolute 0.9  0.1 - 1.0 (K/uL)   Eosinophils Relative 2  0 - 5 (%)   Eosinophils Absolute 0.2  0.0 - 0.7 (K/uL)   Basophils Relative 0  0 - 1 (%)   Basophils Absolute 0.0  0.0 - 0.1 (K/uL)  GLUCOSE, CAPILLARY     Status: Abnormal   Collection Time   12/13/11  8:19 AM      Component Value Range   Glucose-Capillary 56 (*) 70 - 99 (mg/dL)  GLUCOSE, CAPILLARY     Status: Abnormal   Collection Time   12/13/11  8:19 AM      Component Value Range   Glucose-Capillary 49 (*) 70 - 99 (mg/dL)  GLUCOSE, CAPILLARY     Status: Abnormal   Collection Time   12/13/11  8:22 AM      Component Value Range   Glucose-Capillary 54 (*) 70 - 99 (mg/dL)  GLUCOSE, CAPILLARY     Status: Abnormal   Collection Time   12/13/11  8:48 AM      Component Value Range   Glucose-Capillary 195 (*) 70 - 99 (mg/dL)     Assessment/Plan:   NEURO  No issues   Plan: to chair, ambulating tylenal  PULM  No issues   Plan: On stable Home O2, reduce to home O2 and check sats  CARDIO  Congestive Heart Failure (diastolic)   Plan: Monitor fluid status prior to HD, tele dc  RENAL  Chronic Renal Insufficiency and ESRD (end stage renal disease)   Plan: urine output eleavted Observation then HD in am ?? Regardless will plan dc home wed Chem in am , observe k  GI  No issues  Plan: Renal diet tolerated ppi dc, tolerated well  ID  Not infected   Plan: No abx  HEME  Anemia anemia of renal disease)   Plan: Continue  home meds, no role Tx, cbc in am   ENDO Diabetes Mellitus (Type 2)    Plan: SSI added now hypoglycemic event Reduce lantus now cbg follow up  Global Issues   To floor home soon   LOS: 2 days   [name] J. 12/11/2011

## 2011-12-14 ENCOUNTER — Encounter: Payer: Self-pay | Admitting: Gastroenterology

## 2011-12-14 LAB — GLUCOSE, CAPILLARY
Glucose-Capillary: 212 mg/dL — ABNORMAL HIGH (ref 70–99)
Glucose-Capillary: 27 mg/dL — CL (ref 70–99)
Glucose-Capillary: 38 mg/dL — CL (ref 70–99)
Glucose-Capillary: 92 mg/dL (ref 70–99)
Glucose-Capillary: 287 mg/dL — ABNORMAL HIGH (ref 70–99)
Glucose-Capillary: 274 mg/dL — ABNORMAL HIGH (ref 70–99)

## 2011-12-14 LAB — RENAL FUNCTION PANEL
BUN: 72 mg/dL — ABNORMAL HIGH (ref 6–23)
CO2: 36 mEq/L — ABNORMAL HIGH (ref 19–32)
Chloride: 96 mEq/L (ref 96–112)
Glucose, Bld: 33 mg/dL — CL (ref 70–99)
Phosphorus: 4.9 mg/dL — ABNORMAL HIGH (ref 2.3–4.6)
Potassium: 3.2 mEq/L — ABNORMAL LOW (ref 3.5–5.1)

## 2011-12-14 LAB — CBC
HCT: 37.1 % (ref 36.0–46.0)
Hemoglobin: 11.6 g/dL — ABNORMAL LOW (ref 12.0–15.0)
MCV: 93.5 fL (ref 78.0–100.0)
RBC: 3.97 MIL/uL (ref 3.87–5.11)
WBC: 9.1 10*3/uL (ref 4.0–10.5)

## 2011-12-14 LAB — HEPATIC FUNCTION PANEL
Bilirubin, Direct: <0.1 mg/dL (ref 0.0–0.3)
Total Bilirubin: 0.3 mg/dL (ref 0.3–1.2)

## 2011-12-14 MED ORDER — TRAMADOL HCL 50 MG PO TABS
50.0000 mg | ORAL_TABLET | Freq: Two times a day (BID) | ORAL | Status: DC | PRN
  Administered 2011-12-14 – 2011-12-15 (×2): 50 mg via ORAL
  Filled 2011-12-14 (×2): qty 1

## 2011-12-14 MED ORDER — GLUCOSE 40 % PO GEL
ORAL | Status: AC
  Administered 2011-12-14: 37.5 g
  Filled 2011-12-14: qty 1

## 2011-12-14 MED ORDER — DEXTROSE-NACL 5-0.45 % IV SOLN: INTRAVENOUS | Status: DC

## 2011-12-14 MED ORDER — INSULIN GLARGINE 100 UNIT/ML ~~LOC~~ SOLN
10.0000 [IU] | Freq: Every day | SUBCUTANEOUS | Status: DC
  Administered 2011-12-14: 10 [IU] via SUBCUTANEOUS

## 2011-12-14 NOTE — Progress Notes (Signed)
Patient name: [name] Medical record number: [id number] Date of birth: February 12, 1946 Age: 66 y.o. Gender: female PCP: Nadean Corwin, MD, MD  Date of service: 12/14/2011  Brief Hx -  [name] is a 66 y.o. female with ESRD, now hyperkalemia.  Admission for urgent new dialysis.  Lines/tubes none  Microbiology/Sepsis markers: Results for orders placed during the hospital encounter of 12/11/11  MRSA PCR SCREENING     Status: Abnormal   Collection Time   12/11/11 11:55 PM      Component Value Range Status Comment   MRSA by PCR POSITIVE (*) NEGATIVE  Final     Anti-infectives:  Anti-infectives    None      Consults: Treatment Team:  Cecille Aver, MDNephrology    Events: 1/20- hyper K , emergen HD, K wnl 1/21- excellent output, k wnl  Subjective/ Overnight: Hypoglycemic this am  Vital signs for last 24 hours: Temp:  [97.5 F (36.4 C)-98.5 F (36.9 C)] 97.9 F (36.6 C) (01/23 0500) Pulse Rate:  [50-60] 50  (01/23 0500) Resp:  [16-18] 18  (01/23 0500) BP: (153-171)/(56-69) 171/69 mmHg (01/23 0500) SpO2:  [97 %-100 %] 99 % (01/23 0500) Weight:  [199 lb 8 oz (90.493 kg)] 199 lb 8 oz (90.493 kg) (01/23 0500)  Physical Exam:  General: pleasant female, NAD Neuro: awake and alert, appropriate, MAE CV:  s1s2 rrr, no m/r/g PULM: resps even non labored on 2L, diminished bases, otherwise CTA GI: abd soft, +bs Extremities: warm and dry, no edema     Recent Labs: CBC    Component Value Date/Time   WBC 9.1 12/14/2011 0625   RBC 3.97 12/14/2011 0625   HGB 11.6* 12/14/2011 0625   HCT 37.1 12/14/2011 0625   PLT 220 12/14/2011 0625   MCV 93.5 12/14/2011 0625   MCH 29.2 12/14/2011 0625   MCHC 31.3 12/14/2011 0625   RDW 17.2* 12/14/2011 0625   LYMPHSABS 1.2 12/13/2011 0715   MONOABS 0.9 12/13/2011 0715   EOSABS 0.2 12/13/2011 0715   BASOSABS 0.0 12/13/2011 0715    BMET    Component Value Date/Time   NA 144 12/14/2011 0625   K 3.2* 12/14/2011 0625   CL  96 12/14/2011 0625   CO2 36* 12/14/2011 0625   GLUCOSE PENDING 12/14/2011 0625   BUN 72* 12/14/2011 0625   CREATININE 3.79* 12/14/2011 0625   CALCIUM 10.1 12/14/2011 0625   GFRNONAA 12* 12/14/2011 0625   GFRAA 13* 12/14/2011 0625       Assessment/Plan:   1. CKD Stg IV-V - Required urgent HD for hyperkalemia.  Unsure if she has progressed to needing permanent HD.  Has RUA AVF. S/p HD x 2 this admit with resolution of hyperkalemia.  Renal [name]) following.  PLAN -  No hd planned Renal following  - cont outpt renal f/u as well  k supp and lasix recs noted for dc from renal F/u chem  BP control   2. Hyperkalemia -- in setting CKD and potassium repletion.  Resolved after HD 1/21.  Now hyPOkalemic.  PLAN -  k 3.2, k supp 20 pe renal noted with lasix F/u chem in am if stays in house   3. Anemia -- in setting renal disease. No s/s bleeding PLAN -  Cont SQ heparin F/u cbc  4. DM -- Now with multiple hypoglycemic episodes. Asymptomatic. Improved with PO's. [name] PCP PLAN -  Decrease lantus  (home dose 60 daily, current 40 daily, will change to 10 units SQ qhs)  q4 CBG outpt f/u pcp Consider 24 hrs further observation with low Glu lft in am   5. CHF - diastolic PLAN -  Cont lasix per renal    LOS: 3 days   WHITEHEART,KATHRYN, NP 12/14/2011  8:42 AM Pager: [phone]  [name]. [name] Alias, MD, FACP Pgr: [phone] [facility]   *Care during the described time interval was provided by me and/or other providers on the critical care team. I have reviewed this patient's available data, including medical history, events of note, physical examination and test results as part of my evaluation.

## 2011-12-14 NOTE — Progress Notes (Signed)
Subjective:  Patient has had no significant events overnight, continues to have good UOP, creatinine down some.  Only complaint is of L leg pain.  Objective Vital signs in last 24 hours: Filed Vitals:   12/13/11 1136 12/13/11 1325 12/13/11 2100 12/14/11 0500  BP: 153/56 154/68 160/68 171/69  Pulse: 60 59 56 50  Temp: 97.8 F (36.6 C) 97.5 F (36.4 C) 98.5 F (36.9 C) 97.9 F (36.6 C)  TempSrc: Oral Oral Oral Oral  Resp: 16 18 18 18   Height:      Weight:    90.493 kg (199 lb 8 oz)  SpO2: 100% 97% 100% 99%   Weight change: -3.608 kg (-7 lb 15.3 oz)  Intake/Output Summary (Last 24 hours) at 12/14/11 0842 Last data filed at 12/13/11 1700  Gross per 24 hour  Intake    360 ml  Output   1900 ml  Net  -1540 ml   Labs: Basic Metabolic Panel:  Lab 12/14/11 1610 12/13/11 0715 12/12/11 0645  NA 144 143 141  K 3.2* 3.5 4.6  CL 96 101 102  CO2 36* 30 23  GLUCOSE 33* 32* 227*  BUN 72* 71* 73*  CREATININE 3.79* 3.92* 4.15*  CALCIUM 10.1 9.5 9.0  ALB -- -- --  PHOS 4.9* 5.3* 6.4*   Liver Function Tests:  Lab 12/14/11 9604 12/13/11 0715 12/12/11 0645 12/12/11 0117 12/11/11 2023  AST -- -- -- -- 143*  ALT -- -- -- 304* 206*  ALKPHOS -- -- -- -- 105  BILITOT -- -- -- -- 0.5  PROT -- -- -- -- 6.6  ALBUMIN 3.1* 3.0* 3.1* -- --    Lab 12/11/11 1846  LIPASE 14  AMYLASE --   No results found for this basename: AMMONIA:3 in the last 168 hours CBC:  Lab 12/14/11 0625 12/13/11 0715 12/12/11 0645 12/11/11 1846 12/09/11 2240  WBC 9.1 8.4 10.9* -- --  NEUTROABS -- 6.2 -- 10.7* 6.5  HGB 11.6* 10.5* 10.6* -- --  HCT 37.1 32.8* 33.5* -- --  MCV 93.5 92.4 92.5 94.8 93.3  PLT 220 165 182 -- --   Cardiac Enzymes:  Lab 12/11/11 1846 12/09/11 2240  CKTOTAL -- 116  CKMB -- 3.9  CKMBINDEX -- --  TROPONINI <0.30 <0.30   CBG:  Lab 12/14/11 0659 12/14/11 0641 12/14/11 0629 12/13/11 2155 12/13/11 1654  GLUCAP 92 38* 27* 212* 149*    Iron Studies: No results found for this  basename: IRON,TIBC,TRANSFERRIN,FERRITIN in the last 72 hours Studies/Results: No results found. Medications: Infusions:    Scheduled Medications:    . aspirin  81 mg Oral Daily  . calcium acetate  667 mg Oral TID WC  . Chlorhexidine Gluconate Cloth  6 each Topical Q0600  . cholecalciferol  10,000 Units Oral Daily  . citalopram  40 mg Oral Daily  . cloNIDine  0.3 mg Oral BID  . dextrose      . fluticasone  1 spray Each Nare Daily  . furosemide  160 mg Oral BID  . heparin  5,000 Units Subcutaneous Q8H  . insulin aspart  0-15 Units Subcutaneous TID WC  . insulin glargine  40 Units Subcutaneous Daily  . loratadine  10 mg Oral Daily  . mupirocin ointment  1 application Nasal BID  . paricalcitol  1 mcg Oral Q M,W,F  . rosuvastatin  20 mg Oral Daily  . sodium chloride  3 mL Intravenous Q12H  . vitamin B-12  1,000 mcg Oral QHS  . DISCONTD: famotidine  20 mg Oral BID  . DISCONTD: insulin glargine  60 Units Subcutaneous Daily    have reviewed scheduled and prn medications.  Physical Exam: General: sleepy but when aroused , NAD Heart: RRR Lungs: mostly clear Abdomen: soft, NT Extremities: minimal edema Dialysis Access: L UE AVF,patent  I Assessment/ Plan: Pt is a 66 y.o. yo female who was admitted on 12/11/2011 with  Hyperkalemia in the setting of advanced CKD and K repletion  Assessment/Plan: 1. Hyperkalemia- hopefully just due to CKD and potassium repletion. She only required one HD treatment for hyperkalemia and now seems to be holding her own.   As OP she was on 120 meq of KCL per day.  K is now 3.2, she is also on diuretics.  I have told her to resume 20 meq per day and we will follow labs as OP from [facility] 2. BP/vol- BP still overall high, continue home meds as well as diuretics.  Send out on home meds, further tweeking can be done as OP 3. Anemia- hgb dropped from admit,  on ESA as OP, will continue 4. Secondary hyperparathyroidism- on phoslo and zemplar as OP, am  continuing. 5.  I have no issue with discharging patient today, [facility] will be in touch to get follow up labs/appt for patient.    Regarding leg pain, will give ultram this AM   Latamara Melder A   12/14/2011,8:42 AM  LOS: 3 days

## 2011-12-14 NOTE — Progress Notes (Signed)
Patient's blood glucose this am at 0629 reads 27. Patient asymptomatic and talking with Clinical research associate. Requested just orange juice as she will eat breakfast shortly. 240 ml juice given. Rechecked at 0641 and reads 38. Dextrose oral gel given. Rechecked at 0659 and reads 92.

## 2011-12-15 LAB — BASIC METABOLIC PANEL
Calcium: 9.9 mg/dL (ref 8.4–10.5)
Creatinine, Ser: 3.8 mg/dL — ABNORMAL HIGH (ref 0.50–1.10)
GFR calc non Af Amer: 12 mL/min — ABNORMAL LOW (ref >90)
Glucose, Bld: 247 mg/dL — ABNORMAL HIGH (ref 70–99)
Sodium: 137 mEq/L (ref 135–145)

## 2011-12-15 LAB — GLUCOSE, CAPILLARY

## 2011-12-15 LAB — HEPATIC FUNCTION PANEL
Albumin: 3 g/dL — ABNORMAL LOW (ref 3.5–5.2)
Alkaline Phosphatase: 89 U/L (ref 39–117)
Total Bilirubin: 0.3 mg/dL (ref 0.3–1.2)
Total Protein: 6.9 g/dL (ref 6.0–8.3)

## 2011-12-15 LAB — CBC
MCH: 29.3 pg (ref 26.0–34.0)
MCHC: 31.3 g/dL (ref 30.0–36.0)
MCV: 93.7 fL (ref 78.0–100.0)
Platelets: 188 10*3/uL (ref 150–400)

## 2011-12-15 MED ORDER — POTASSIUM CHLORIDE CRYS ER 20 MEQ PO TBCR: 20.0000 meq | EXTENDED_RELEASE_TABLET | Freq: Every day | ORAL | Status: DC

## 2011-12-15 MED ORDER — INSULIN GLARGINE 100 UNIT/ML ~~LOC~~ SOLN: 15.0000 [IU] | Freq: Every day | SUBCUTANEOUS | Status: DC

## 2011-12-15 MED ORDER — PARICALCITOL 1 MCG PO CAPS: 1.0000 ug | ORAL_CAPSULE | ORAL | Status: DC

## 2011-12-15 MED ORDER — TRAMADOL HCL 50 MG PO TABS: 50.0000 mg | ORAL_TABLET | Freq: Two times a day (BID) | ORAL | Status: DC | PRN

## 2011-12-15 MED ORDER — CLONIDINE HCL 0.3 MG PO TABS: 0.3000 mg | ORAL_TABLET | Freq: Two times a day (BID) | ORAL | Status: DC

## 2011-12-15 NOTE — Discharge Summary (Signed)
Agree Improved glu For renal recs K ,lasix

## 2011-12-15 NOTE — Progress Notes (Signed)
Pt has order to be discharged. VSS. Pt packed and all belongings given to patient. Pt has no complaints at this time and is "ready to go home." Pt discharged to home with family member.  Minor, Yvette Rack

## 2011-12-15 NOTE — Discharge Summary (Signed)
Physician Discharge Summary  Patient ID: [name] MRN: [id number] DOB/AGE: May 20, 1946 66 y.o.  Admit date: 12/11/2011 Discharge date: 12/15/2011    Discharge Diagnoses:  Principal Problem:  *RENAL INSUFFICIENCY Active Problems:  BRADYCARDIA  Chronic diastolic heart failure  COPD, MILD    Brief Summary: [name] is a 66 y.o. y/o female with a PMH of Stage V CKD who was admitted 12/11/2011 with hyperkalemia and urgent HD.   Hospital Course by Discharge Summary CKD Stg IV-V - Required urgent HD for hyperkalemia.  Has RUA AVF. S/p HD x 2 this admit with resolution of hyperkalemia and good UOP. Renal Reginal Lutes) followed closely.  Primary renal is Dr. Hyman Hopes.  NO further HD indicated at this time.  They will f/u as oupt with chemistries. Cont lasix per renal recs and tight BP control.    Hyperkalemia -- in setting CKD and potassium repletion. Resolved after HD.  Baseline supp K decreased  Will d/c on daily with f/u labs as outpt.    Anemia -- in setting renal disease. No s/s bleeding.  Hgb remained stable. Tol SQ heparin.  Outpt f/u labs.   DM -- Difficult to control at baseline.  Has had multiple hypoglycemic events this admit. Asymptomatic. Improved with largely decreased lantus dose.  Will d/c on modified dose lantus (15 units daily, was 60units daily) and SSI and have her f/u with [facility] within 1 week.   CHF - diastolic.  No obvious volume overload.  Good uop with lasix.  Cont prev lasix dose at d/c with titration as needed per renal.   Bradycardia - In setting hyperkalemia.  Her cardizem and coreg were stopped on admit in setting bradycardia and hyperkalemia.  BP has tol well.  HR remains low at d/c so will hold off on resuming cardizem or coreg for now.  Can resume as outpt per pcp or renal as needed.    COPD, MILD - On home O2.  No resp issues this admit.  Will d/c home on prev resp rx and home O2 and f/u as outpt with Dr. Delton Coombes.    Consults: Renal -  [place]  Lines/tubes: none  Microbiology/Sepsis markers: MRSA PCR - POS   Discharge Exam: General: pleasant female, NAD  Neuro: awake and alert, appropriate, MAE  CV: s1s2 rrr, no m/r/g  PULM: resps even non labored on 2L, diminished bases, otherwise CTA  GI: abd soft, +bs  Extremities: warm and dry, no edema     Most Recent Labs  BMET  Lab 12/15/11 0545 12/14/11 0625 12/13/11 0715 12/12/11 0645 12/12/11 0117  NA 137 144 143 141 131*  K 3.3* 3.2* -- -- --  CL 91* 96 101 102 99  CO2 34* 36* 30 23 19   GLUCOSE 247* 33* 32* 227* 336*  BUN 77* 72* 71* 73* 96*  CREATININE 3.80* 3.79* 3.92* 4.15* 5.20*  CALCIUM 9.9 10.1 9.5 9.0 9.0  MG -- -- -- 2.3 2.5  PHOS -- 4.9* 5.3* 6.4* 6.3*     CBC   Lab 12/15/11 0545 12/14/11 0625 12/13/11 0715  HGB 11.2* 11.6* 10.5*  HCT 35.8* 37.1 32.8*  WBC 7.0 9.1 8.4  PLT 188 220 165   Anti-Coagulation  Lab 12/11/11 1846  INR 1.20       Follow-up Information    Follow up with Nadean Corwin, MD on 12/22/2011. (10:45 with [place], [place])    Contact information:   [address] [place] [address] [phone]  Follow up with Garnetta Buddy, MD. Schedule an appointment as soon as possible for a visit in 1 week.   Contact information:   [address] [place] [address] [phone]       Follow up with Leslye Peer., MD on 01/09/2012. (4:15pm )    Contact information:   [address] [employer] [facility] [place] [address] [phone]          [place], [place]  Home Medication Instructions [id number]   Printed on:12/15/11 0843  Medication Information                    aspirin 81 MG tablet Take 81 mg by mouth daily.             insulin lispro (HUMALOG) 100 UNIT/ML injection Inject 14-26 Units into the skin 3 (three) times daily. Sliding scale as directed: 70-150=14 units, 151-200=17 units, 210-250=20 units, 251-300=23 units &  >300=26 units           pregabalin (LYRICA) 50 MG capsule Take 50 mg by mouth at bedtime.            metolazone (ZAROXOLYN) 5 MG tablet Take 5 mg by mouth every Monday, Wednesday, and Friday.            vitamin B-12 (CYANOCOBALAMIN) 1000 MCG tablet Take 1,000 mcg by mouth at bedtime.            calcium acetate (PHOSLO) 667 MG capsule Take 1 tablet by mouth 3 (three) times daily with meals.            atorvastatin (LIPITOR) 40 MG tablet Take 1 tablet by mouth every morning.            citalopram (CELEXA) 40 MG tablet Take 1 tablet by mouth daily.           furosemide (LASIX) 80 MG tablet Take 160 mg by mouth 2 (two) times daily.            triamcinolone (NASACORT) 55 MCG/ACT nasal inhaler Place 2 sprays into the nose daily as needed. For nasal congestion           loratadine (CLARITIN) 10 MG tablet Take 10 mg by mouth daily.           Cholecalciferol (VITAMIN D3) 5000 UNITS CAPS Take 2 capsules by mouth daily.           albuterol (PROVENTIL HFA;VENTOLIN HFA) 108 (90 BASE) MCG/ACT inhaler Inhale 2 puffs into the lungs every 6 (six) hours as needed. For wheezing           cloNIDine (CATAPRES) 0.3 MG tablet Take 1 tablet (0.3 mg total) by mouth 2 (two) times daily.           insulin glargine (LANTUS) 100 UNIT/ML injection Inject 15 Units into the skin daily. 60 units every morning           potassium chloride SA (K-DUR,KLOR-CON) 20 MEQ tablet Take 1 tablet (20 mEq total) by mouth daily.           traMADol (ULTRAM) 50 MG tablet Take 1 tablet (50 mg total) by mouth every 12 (twelve) hours as needed for pain.           paricalcitol (ZEMPLAR) 1 MCG capsule Take 1 capsule (1 mcg total) by mouth every Monday, Wednesday, and Friday.               Disposition: Home or Self Care  Discharged  Condition: [name] has met maximum benefit of inpatient care and is medically stable and cleared for discharge.  Patient is pending follow up as above.      Time spent on  disposition:  Greater than 35 minutes.   Danford Bad, NP 12/15/2011  8:39 AM Pager: [phone]) [phone]  *Care during the described time interval was provided by me and/or other providers on the critical care team. I have reviewed this patient's available data, including medical history, events of note, physical examination and test results as part of my evaluation.

## 2011-12-16 DIAGNOSIS — N179 Acute kidney failure, unspecified: Secondary | ICD-10-CM

## 2011-12-16 DIAGNOSIS — I498 Other specified cardiac arrhythmias: Secondary | ICD-10-CM

## 2011-12-16 DIAGNOSIS — E875 Hyperkalemia: Secondary | ICD-10-CM

## 2011-12-21 NOTE — ED Provider Notes (Signed)
Evaluation and management procedures were performed by the resident physician under my supervision/collaboration.    Melah Ebling D Bently Wyss, MD 12/21/11 1923 

## 2011-12-23 ENCOUNTER — Encounter (HOSPITAL_COMMUNITY): Payer: Self-pay | Admitting: Emergency Medicine

## 2011-12-23 ENCOUNTER — Other Ambulatory Visit: Payer: Self-pay

## 2011-12-23 ENCOUNTER — Inpatient Hospital Stay (HOSPITAL_COMMUNITY)
Admission: EM | Admit: 2011-12-23 | Discharge: 2011-12-25 | DRG: 313 | Disposition: A | Discharge status: Home / Self Care | Payer: Medicare Other | Attending: Internal Medicine | Admitting: Internal Medicine

## 2011-12-23 DIAGNOSIS — N179 Acute kidney failure, unspecified: Secondary | ICD-10-CM | POA: Diagnosis present

## 2011-12-23 DIAGNOSIS — D649 Anemia, unspecified: Secondary | ICD-10-CM | POA: Diagnosis present

## 2011-12-23 DIAGNOSIS — E785 Hyperlipidemia, unspecified: Secondary | ICD-10-CM | POA: Insufficient documentation

## 2011-12-23 DIAGNOSIS — Z87891 Personal history of nicotine dependence: Secondary | ICD-10-CM

## 2011-12-23 DIAGNOSIS — Z79899 Other long term (current) drug therapy: Secondary | ICD-10-CM

## 2011-12-23 DIAGNOSIS — Z7982 Long term (current) use of aspirin: Secondary | ICD-10-CM

## 2011-12-23 DIAGNOSIS — E119 Type 2 diabetes mellitus without complications: Secondary | ICD-10-CM | POA: Diagnosis present

## 2011-12-23 DIAGNOSIS — F411 Generalized anxiety disorder: Secondary | ICD-10-CM | POA: Diagnosis present

## 2011-12-23 DIAGNOSIS — K219 Gastro-esophageal reflux disease without esophagitis: Secondary | ICD-10-CM | POA: Diagnosis present

## 2011-12-23 DIAGNOSIS — E663 Overweight: Secondary | ICD-10-CM | POA: Insufficient documentation

## 2011-12-23 DIAGNOSIS — E876 Hypokalemia: Secondary | ICD-10-CM | POA: Diagnosis present

## 2011-12-23 DIAGNOSIS — N184 Chronic kidney disease, stage 4 (severe): Secondary | ICD-10-CM | POA: Diagnosis present

## 2011-12-23 DIAGNOSIS — F3289 Other specified depressive episodes: Secondary | ICD-10-CM | POA: Diagnosis present

## 2011-12-23 DIAGNOSIS — I129 Hypertensive chronic kidney disease with stage 1 through stage 4 chronic kidney disease, or unspecified chronic kidney disease: Secondary | ICD-10-CM | POA: Diagnosis present

## 2011-12-23 DIAGNOSIS — M1990 Unspecified osteoarthritis, unspecified site: Secondary | ICD-10-CM | POA: Diagnosis present

## 2011-12-23 DIAGNOSIS — N19 Unspecified kidney failure: Secondary | ICD-10-CM

## 2011-12-23 DIAGNOSIS — I509 Heart failure, unspecified: Secondary | ICD-10-CM | POA: Diagnosis present

## 2011-12-23 DIAGNOSIS — I498 Other specified cardiac arrhythmias: Secondary | ICD-10-CM | POA: Diagnosis present

## 2011-12-23 DIAGNOSIS — Z794 Long term (current) use of insulin: Secondary | ICD-10-CM

## 2011-12-23 DIAGNOSIS — F329 Major depressive disorder, single episode, unspecified: Secondary | ICD-10-CM | POA: Diagnosis present

## 2011-12-23 DIAGNOSIS — I10 Essential (primary) hypertension: Secondary | ICD-10-CM | POA: Insufficient documentation

## 2011-12-23 DIAGNOSIS — I5033 Acute on chronic diastolic (congestive) heart failure: Secondary | ICD-10-CM | POA: Diagnosis present

## 2011-12-23 DIAGNOSIS — R0789 Other chest pain: Principal | ICD-10-CM | POA: Diagnosis present

## 2011-12-23 DIAGNOSIS — I5032 Chronic diastolic (congestive) heart failure: Secondary | ICD-10-CM | POA: Diagnosis present

## 2011-12-23 MED ORDER — ONDANSETRON HCL 4 MG/2ML IJ SOLN
4.0000 mg | Freq: Once | INTRAMUSCULAR | Status: AC
  Administered 2011-12-23: 4 mg via INTRAVENOUS
  Filled 2011-12-23: qty 2

## 2011-12-23 MED ORDER — MORPHINE SULFATE 4 MG/ML IJ SOLN
4.0000 mg | Freq: Once | INTRAMUSCULAR | Status: AC
  Administered 2011-12-23: 4 mg via INTRAVENOUS
  Filled 2011-12-23: qty 1

## 2011-12-23 MED ORDER — NITROGLYCERIN 0.4 MG SL SUBL: 0.4000 mg | SUBLINGUAL_TABLET | SUBLINGUAL | Status: DC | PRN

## 2011-12-23 MED ORDER — ASPIRIN 81 MG PO CHEW
324.0000 mg | CHEWABLE_TABLET | Freq: Once | ORAL | Status: AC
  Administered 2011-12-23: 324 mg via ORAL
  Filled 2011-12-23: qty 4

## 2011-12-23 NOTE — ED Notes (Signed)
Pt c/o pain in right shoulder radiating down right arm  And under right breast onset yesterday am. Describes pain as pulling.  Pain increases with movement

## 2011-12-23 NOTE — ED Provider Notes (Signed)
History     CSN: [id number]  Arrival date & time 12/23/11  2150   First MD Initiated Contact with Patient 12/23/11 2309      Chief Complaint  Patient presents with  . Chest Pain    (Consider location/radiation/quality/duration/timing/severity/associated sxs/prior treatment) HPI Patient presents with complaint of right upper chest pain as well as right shoulder and neck pain. She states she's had this pain intermittently for quite some time but it has been constant sent yesterday morning, approximately 36 hours. Pain is described as a dull aching feeling and is worse with inspiration. The pain in her right shoulder is worse with movement of the shoulder and pain in her right neck. Movement of the right shoulder does not affect her chest pain. She was seen by her primary doctor 2 days ago and was prescribed tramadol for the pain. She states this has not been helping. She, per chart review, was recently admitted and discharged from the hospital with renal failure which required dialysis. She also was diagnosed with congestive heart failure. She and her family state that her weight is down since her hospitalization and she denies any edema or shortness of breath. She states that she had a stress test performed by cardiology "several years ago" and her members as to have been normal. She denies any injury or falls. There's been no fever or cough. There no other associated systemic symptoms. And no other alleviating or modifying factors.   Past Medical History  Diagnosis Date  . Type II or unspecified type diabetes mellitus without mention of complication, not stated as uncontrolled   . Esophageal reflux   . DJD (degenerative joint disease)   . Depressive disorder, not elsewhere classified   . Unspecified essential hypertension   . Chronic cough   . Renal insufficiency   . CHF (congestive heart failure)     with well preserved EF   . HTN (hypertension)   . HLD (hyperlipidemia)   . Shortness  of breath   . Anxiety   . Anemia     Past Surgical History  Procedure Date  . Insertion of dialysis catheter     Family History  Problem Relation Age of Onset  . Heart disease Father   . Cancer Sister   . Hypertension    . Diabetes      History  Substance Use Topics  . Smoking status: Former Smoker -- 1.0 packs/day for 30 years    Types: Cigarettes    Quit date: 11/22/2007  . Smokeless tobacco: Not on file   Comment: 1ppd x 30 years  . Alcohol Use: No    OB History    Grav Para Term Preterm Abortions TAB SAB Ect Mult Living                  Review of Systems ROS reviewed and otherwise negative except for mentioned in HPI  Allergies  Codeine  Home Medications   No current outpatient prescriptions on file.  BP 126/49  Pulse 58  Temp(Src) 97.9 F (36.6 C) (Oral)  Resp 19  Ht 5' 5.6" (1.666 m)  Wt 202 lb 2.6 oz (91.7 kg)  BMI 33.03 kg/m2  SpO2 100% Vitals reviewed Physical Exam Physical Examination: General appearance - alert, well appearing, and in no distress Mental status - alert, oriented to person, place, and time Eyes - pupils equal and reactive, no scleral icterus Mouth - mucous membranes moist, pharynx normal without lesions Chest - clear to auscultation, no wheezes,  rales or rhonchi, symmetric air entry, chest wall nontender Heart - normal rate, regular rhythm, normal S1, S2, no murmurs, rubs, clicks or gallops Abdomen - soft, nontender, nondistended, no masses or organomegaly Neurological - alert, oriented, normal speech, no focal findings, strength 5/5 in extremities, sensation intact to light touch Musculoskeletal - no joint tenderness, deformity or swelling, some pain with ROM of right shoulder, no bony point tenderness Extremities - peripheral pulses normal, no pedal edema, no clubbing or cyanosis Skin - normal coloration and turgor, no rashes  ED Course  Procedures (including critical care time)   Date: 12/23/2011  Rate: 56  Rhythm:  sinus bradycardia  QRS Axis: normal  Intervals: normal  ST/T Wave abnormalities: normal  Conduction Disutrbances:none  Narrative Interpretation:   Old EKG Reviewed: unchanged compared to 12/10/11    Labs Reviewed  CBC - Abnormal; Notable for the following:    Hemoglobin 11.4 (*)    HCT 35.4 (*)    RDW 16.0 (*)    All other components within normal limits  COMPREHENSIVE METABOLIC PANEL - Abnormal; Notable for the following:    Sodium 133 (*)    Potassium 3.0 (*)    Chloride 88 (*)    Glucose, Bld 203 (*)    BUN 108 (*)    Creatinine, Ser 5.67 (*)    Albumin 3.3 (*)    Total Bilirubin 0.2 (*)    GFR calc non Af Amer 7 (*)    GFR calc Af Amer 8 (*)    All other components within normal limits  BASIC METABOLIC PANEL - Abnormal; Notable for the following:    Potassium 2.7 (*)    Chloride 91 (*)    CO2 34 (*)    Glucose, Bld 50 (*)    BUN 114 (*)    Creatinine, Ser 5.77 (*)    GFR calc non Af Amer 7 (*)    GFR calc Af Amer 8 (*)    All other components within normal limits  CBC - Abnormal; Notable for the following:    Hemoglobin 11.6 (*)    HCT 35.9 (*)    RDW 16.1 (*)    All other components within normal limits  CARDIAC PANEL(CRET KIN+CKTOT+MB+TROPI) - Abnormal; Notable for the following:    CK, MB 5.1 (*)    Relative Index 3.7 (*)    All other components within normal limits  CARDIAC PANEL(CRET KIN+CKTOT+MB+TROPI) - Abnormal; Notable for the following:    CK, MB 4.5 (*)    Relative Index 3.6 (*)    All other components within normal limits  CARDIAC PANEL(CRET KIN+CKTOT+MB+TROPI) - Abnormal; Notable for the following:    CK, MB 4.4 (*)    Relative Index 3.5 (*)    All other components within normal limits  MAGNESIUM - Abnormal; Notable for the following:    Magnesium 2.8 (*)    All other components within normal limits  GLUCOSE, CAPILLARY - Abnormal; Notable for the following:    Glucose-Capillary 46 (*)    All other components within normal limits  POTASSIUM  - Abnormal; Notable for the following:    Potassium 3.0 (*)    All other components within normal limits  GLUCOSE, CAPILLARY - Abnormal; Notable for the following:    Glucose-Capillary 167 (*)    All other components within normal limits  GLUCOSE, CAPILLARY - Abnormal; Notable for the following:    Glucose-Capillary 188 (*)    All other components within normal limits  GLUCOSE, CAPILLARY -  Abnormal; Notable for the following:    Glucose-Capillary 230 (*)    All other components within normal limits  POCT I-STAT TROPONIN I  GLUCOSE, CAPILLARY    2:26 AM- d/w [facility]- requests holding orders for tele bed, team 5.  Pt updated about findings and plan Dg Chest 2 View  12/24/2011  *RADIOLOGY REPORT*  Clinical Data: Right-sided chest pain.  CHEST - 2 VIEW  Comparison: 01/20 and 12/09/2011  Findings: The patient has chronic cardiomegaly with mild pulmonary vascular prominence without infiltrates or pulmonary edema.  No acute osseous abnormality.  IMPRESSION: No acute abnormalities.  Chronic cardiomegaly with chronic mild pulmonary vascular prominence.  Original Report Authenticated By: Gwynn Burly, M.D.   Dg Shoulder Right  12/24/2011  *RADIOLOGY REPORT*  Clinical Data: Shoulder pain.  RIGHT SHOULDER - 2+ VIEW  Comparison: None.  Findings: Osseous structures are normal.  No fracture or dislocation or soft tissue calcification.  IMPRESSION: Normal right shoulder.  Original Report Authenticated By: Gwynn Burly, M.D.     1. Chest pain   2. Renal failure       MDM   Patient presenting with complaint of chest pain as well as right shoulder pain. On evaluation in the emergency department her EKG and troponin were both reassuring. She does have significant increase in her creatinine above her recent baseline. She recently had dialysis for hyperkalemia and the plan was to monitor her creatinine. Degenerative fairly rapid increase patient was admitted for further evaluation. Acute coronary  syndrome is still a possibility do to her other risk factors however symptoms may be attributed to her renal failure. Shoulder pain appears musculoskeletal in nature based on my examination and history and does not seem to be related to a complaint of chest pain. Patient was updated about these results and is agreeable with the plan for admission.        Ethelda Chick, MD 12/25/11 [id number]

## 2011-12-24 ENCOUNTER — Encounter (HOSPITAL_COMMUNITY): Payer: Self-pay | Admitting: *Deleted

## 2011-12-24 ENCOUNTER — Emergency Department (HOSPITAL_COMMUNITY): Payer: Medicare Other

## 2011-12-24 LAB — CBC
Hemoglobin: 11.4 g/dL — ABNORMAL LOW (ref 12.0–15.0)
MCH: 29.2 pg (ref 26.0–34.0)
MCH: 29.2 pg (ref 26.0–34.0)
MCHC: 32.2 g/dL (ref 30.0–36.0)
MCV: 90.8 fL (ref 78.0–100.0)
Platelets: 245 10*3/uL (ref 150–400)
RBC: 3.9 MIL/uL (ref 3.87–5.11)
RBC: 3.97 MIL/uL (ref 3.87–5.11)
WBC: 9 10*3/uL (ref 4.0–10.5)

## 2011-12-24 LAB — COMPREHENSIVE METABOLIC PANEL
ALT: 32 U/L (ref 0–35)
BUN: 108 mg/dL — ABNORMAL HIGH (ref 6–23)
CO2: 32 mEq/L (ref 19–32)
Calcium: 10.1 mg/dL (ref 8.4–10.5)
Creatinine, Ser: 5.67 mg/dL — ABNORMAL HIGH (ref 0.50–1.10)
GFR calc Af Amer: 8 mL/min — ABNORMAL LOW (ref >90)
GFR calc non Af Amer: 7 mL/min — ABNORMAL LOW (ref >90)
Glucose, Bld: 203 mg/dL — ABNORMAL HIGH (ref 70–99)
Sodium: 133 mEq/L — ABNORMAL LOW (ref 135–145)
Total Protein: 7.2 g/dL (ref 6.0–8.3)

## 2011-12-24 LAB — GLUCOSE, CAPILLARY
Glucose-Capillary: 82 mg/dL (ref 70–99)
Glucose-Capillary: 188 mg/dL — ABNORMAL HIGH (ref 70–99)
Glucose-Capillary: 230 mg/dL — ABNORMAL HIGH (ref 70–99)

## 2011-12-24 LAB — CARDIAC PANEL(CRET KIN+CKTOT+MB+TROPI)
Relative Index: 3.7 — ABNORMAL HIGH (ref 0.0–2.5)
Relative Index: 3.6 — ABNORMAL HIGH (ref 0.0–2.5)
Relative Index: 3.5 — ABNORMAL HIGH (ref 0.0–2.5)
Total CK: 139 U/L (ref 7–177)
Total CK: 125 U/L (ref 7–177)
Total CK: 126 U/L (ref 7–177)
Troponin I: <0.3 ng/mL (ref <0.30)

## 2011-12-24 LAB — POCT I-STAT TROPONIN I: Troponin i, poc: 0.03 ng/mL (ref 0.00–0.08)

## 2011-12-24 LAB — BASIC METABOLIC PANEL
Calcium: 10 mg/dL (ref 8.4–10.5)
GFR calc Af Amer: 8 mL/min — ABNORMAL LOW (ref >90)
GFR calc non Af Amer: 7 mL/min — ABNORMAL LOW (ref >90)
Glucose, Bld: 50 mg/dL — ABNORMAL LOW (ref 70–99)
Sodium: 139 mEq/L (ref 135–145)

## 2011-12-24 LAB — POTASSIUM: Potassium: 3 mEq/L — ABNORMAL LOW (ref 3.5–5.1)

## 2011-12-24 MED ORDER — NITROGLYCERIN 5 MG/ML IV SOLN
2.0000 ug/min | INTRAVENOUS | Status: DC
  Administered 2011-12-24: 5 ug/min via INTRAVENOUS
  Filled 2011-12-24 (×2): qty 20

## 2011-12-24 MED ORDER — HYDROMORPHONE HCL PF 1 MG/ML IJ SOLN
0.5000 mg | INTRAMUSCULAR | Status: DC | PRN
  Administered 2011-12-24 (×2): 0.5 mg via INTRAVENOUS
  Filled 2011-12-24 (×2): qty 1

## 2011-12-24 MED ORDER — SODIUM CHLORIDE 0.9 % IJ SOLN
3.0000 mL | Freq: Two times a day (BID) | INTRAMUSCULAR | Status: DC
  Administered 2011-12-24 – 2011-12-25 (×2): 3 mL via INTRAVENOUS

## 2011-12-24 MED ORDER — ENOXAPARIN SODIUM 40 MG/0.4ML ~~LOC~~ SOLN
40.0000 mg | SUBCUTANEOUS | Status: DC
  Administered 2011-12-24 – 2011-12-25 (×2): 40 mg via SUBCUTANEOUS
  Filled 2011-12-24 (×3): qty 0.4

## 2011-12-24 MED ORDER — INSULIN ASPART 100 UNIT/ML ~~LOC~~ SOLN
0.0000 [IU] | Freq: Every day | SUBCUTANEOUS | Status: DC
  Administered 2011-12-24: 2 [IU] via SUBCUTANEOUS

## 2011-12-24 MED ORDER — ZOLPIDEM TARTRATE 5 MG PO TABS
5.0000 mg | ORAL_TABLET | Freq: Every evening | ORAL | Status: DC | PRN
  Administered 2011-12-24 (×2): 5 mg via ORAL
  Filled 2011-12-24 (×2): qty 1

## 2011-12-24 MED ORDER — ACETAMINOPHEN 650 MG RE SUPP: 650.0000 mg | Freq: Four times a day (QID) | RECTAL | Status: DC | PRN

## 2011-12-24 MED ORDER — INSULIN ASPART 100 UNIT/ML ~~LOC~~ SOLN
0.0000 [IU] | Freq: Three times a day (TID) | SUBCUTANEOUS | Status: DC
  Administered 2011-12-24: 2 [IU] via SUBCUTANEOUS
  Administered 2011-12-25: 14 [IU] via SUBCUTANEOUS
  Filled 2011-12-24: qty 3

## 2011-12-24 MED ORDER — OXYCODONE HCL 5 MG PO TABS: 5.0000 mg | ORAL_TABLET | ORAL | Status: DC | PRN

## 2011-12-24 MED ORDER — SODIUM CHLORIDE 0.9 % IV SOLN
250.0000 mL | INTRAVENOUS | Status: DC | PRN
  Administered 2011-12-24: 250 mL via INTRAVENOUS

## 2011-12-24 MED ORDER — ALUM & MAG HYDROXIDE-SIMETH 200-200-20 MG/5ML PO SUSP: 30.0000 mL | Freq: Four times a day (QID) | ORAL | Status: DC | PRN

## 2011-12-24 MED ORDER — POTASSIUM CHLORIDE CRYS ER 20 MEQ PO TBCR
40.0000 meq | EXTENDED_RELEASE_TABLET | Freq: Once | ORAL | Status: AC
  Administered 2011-12-24: 40 meq via ORAL
  Filled 2011-12-24 (×2): qty 1

## 2011-12-24 MED ORDER — ACETAMINOPHEN 325 MG PO TABS: 650.0000 mg | ORAL_TABLET | Freq: Four times a day (QID) | ORAL | Status: DC | PRN

## 2011-12-24 MED ORDER — POTASSIUM CHLORIDE CRYS ER 20 MEQ PO TBCR
40.0000 meq | EXTENDED_RELEASE_TABLET | Freq: Once | ORAL | Status: AC
  Administered 2011-12-24: 40 meq via ORAL
  Filled 2011-12-24: qty 2

## 2011-12-24 MED ORDER — ONDANSETRON HCL 4 MG PO TABS: 4.0000 mg | ORAL_TABLET | Freq: Four times a day (QID) | ORAL | Status: DC | PRN

## 2011-12-24 MED ORDER — ONDANSETRON HCL 4 MG/2ML IJ SOLN: 4.0000 mg | Freq: Four times a day (QID) | INTRAMUSCULAR | Status: DC | PRN

## 2011-12-24 MED ORDER — SODIUM CHLORIDE 0.9 % IJ SOLN: 3.0000 mL | INTRAMUSCULAR | Status: DC | PRN

## 2011-12-24 NOTE — Progress Notes (Signed)
CBG: 46  Treatment: Breakfast and sprite since pt was asymptomatic and complaining of hunger  Symptoms: None  Follow-up CBG: Time 0815 CBG Result 82  Possible Reasons for Event: Unknown   Comments/MD notified:Dr. Jerelyn Charles, Craig Guess

## 2011-12-24 NOTE — Progress Notes (Signed)
Telephoned Dr. Butler Denmark to report K of 3.0.New orders received to give KCL 40 meq PO

## 2011-12-24 NOTE — ED Notes (Signed)
Pt is in no distress at this time.   

## 2011-12-24 NOTE — ED Notes (Signed)
Pt requested to know when she was going to get a bed; pt advised that she has a bed assigned but we are awaiting the admitting physician. Pt placed on cardiac monitor.  Admitting physician Mort Sawyers, MD paged and called back and is coming to see pt. Pt's daughter stated to tech, Marchelle Folks, that if Lovell Sheehan was the MD that she was going to "cuss her out". Lovell Sheehan, Md notified.

## 2011-12-24 NOTE — Progress Notes (Signed)
The patient was admitted early this AM with compaints of chest pain. She states that she has had this pain for the past 2 days, constant and 10/10 when severe. It is not associated w/ dizziness, diaphoresis or shortness of breath. It is not related to movement of her right arm. It feels like a pulling pain and is sharp at times. It is not reproducible.   We will follow cardiac enzymes. If she has been having it constantly for 2 days, we would expect the enzymes to be elevated if it was cardiac. It sounds very atypical and EKG is not consistent with ischemia. Pain improved only when she received Oxycodone and is currently present while she is on a Nitro drip.  Will check and ECHO as well for wall motion abnormalities. If all is normal, she can be discharged tomorrow.   We will recheck Potassium levels later today and replace if low.   Calvert Cantor, MD [phone]

## 2011-12-24 NOTE — ED Notes (Signed)
Pharmacy contacted to verify receipt of nitro med request

## 2011-12-24 NOTE — Progress Notes (Signed)
Report given to [place], Charity fundraiser. Patient and daughter provided with an update re: new room assignment. Patient transferred to room 2924. Daughter present at bedside.

## 2011-12-24 NOTE — Progress Notes (Signed)
Upon entering room for initial shift assessment, pt was sleeping.  When she was awakened, she states that she is pain free and is feeling much better.

## 2011-12-24 NOTE — H&P (Addendum)
DATE OF ADMISSION:  12/24/2011  PCP:    Nadean Corwin, MD, MD   Chief Complaint: Chest Pain   HPI: [name] is an 66 y.o. female who presents with complaints of severe 10/10 constant chest pain X 2 days.  She reports SOB, and Dyspnea on exertion, along with nausea but no diaphoresis.  The pain is in the Right side of the chest and feels like a pulling sensation, and the pain is not reproducible.  Her cardiologist is Dr. Antoine Poche, and she was hospitalized 2 week ago and had to have dialysis performed due to hyperkalemia, but is not a dialysis patient yet.  Her creatinine increased from discharge from 3.5 her baseline to 5.7 tonight.    Past Medical History  Diagnosis Date  . Type II or unspecified type diabetes mellitus without mention of complication, not stated as uncontrolled   . Esophageal reflux   . DJD (degenerative joint disease)   . Depressive disorder, not elsewhere classified   . Unspecified essential hypertension   . Chronic cough   . Renal insufficiency   . CHF (congestive heart failure)     with well preserved EF   . HTN (hypertension)   . HLD (hyperlipidemia)   . Shortness of breath   . Anxiety   . Anemia     Past Surgical History  Procedure Date  . Insertion of dialysis catheter     Medications:  HOME MEDS: Prior to Admission medications   Medication Sig Start Date End Date Taking? Authorizing Provider  albuterol (PROVENTIL HFA;VENTOLIN HFA) 108 (90 BASE) MCG/ACT inhaler Inhale 2 puffs into the lungs every 6 (six) hours as needed. For wheezing 08/23/11 08/22/12 Yes Leslye Peer, MD  aspirin 81 MG tablet Take 81 mg by mouth daily.     Yes Historical Provider, MD  atorvastatin (LIPITOR) 40 MG tablet Take 1 tablet by mouth every morning.  02/25/11  Yes Historical Provider, MD  calcium acetate (PHOSLO) 667 MG capsule Take 1 tablet by mouth 3 (three) times daily with meals.  02/09/11  Yes Historical Provider, MD  Cholecalciferol (VITAMIN D3) 5000 UNITS  CAPS Take 2 capsules by mouth daily.   Yes Historical Provider, MD  citalopram (CELEXA) 40 MG tablet Take 1 tablet by mouth daily. 02/18/11  Yes Historical Provider, MD  cloNIDine (CATAPRES) 0.3 MG tablet Take 0.3 mg by mouth 2 (two) times daily. 12/15/11  Yes Danford Bad, NP  furosemide (LASIX) 80 MG tablet Take 160 mg by mouth 2 (two) times daily.    Yes Historical Provider, MD  insulin glargine (LANTUS) 100 UNIT/ML injection Inject 60 Units into the skin daily. 12/15/11  Yes Danford Bad, NP  insulin lispro (HUMALOG) 100 UNIT/ML injection Inject 14-26 Units into the skin 3 (three) times daily. Sliding scale as directed: 70-150=14 units, 151-200=17 units, 210-250=20 units, 251-300=23 units & >300=26 units   Yes Historical Provider, MD  loratadine (CLARITIN) 10 MG tablet Take 10 mg by mouth daily.   Yes Historical Provider, MD  metolazone (ZAROXOLYN) 5 MG tablet Take 5 mg by mouth every Monday, Wednesday, and Friday.    Yes Historical Provider, MD  paricalcitol (ZEMPLAR) 1 MCG capsule Take 1 mcg by mouth every Monday, Wednesday, and Friday. 12/15/11  Yes Danford Bad, NP  potassium chloride SA (K-DUR,KLOR-CON) 20 MEQ tablet Take 20 mEq by mouth daily. 12/15/11  Yes Danford Bad, NP  pregabalin (LYRICA) 50 MG capsule Take 50 mg by mouth at bedtime.    Yes Historical Provider, MD  traMADol (ULTRAM) 50 MG tablet Take 50 mg by mouth every 12 (twelve) hours as needed. For pain 12/15/11 12/25/11 Yes Danford Bad, NP  triamcinolone (NASACORT) 55 MCG/ACT nasal inhaler Place 2 sprays into the nose daily as needed. For nasal congestion   Yes Historical Provider, MD  vitamin B-12 (CYANOCOBALAMIN) 1000 MCG tablet Take 1,000 mcg by mouth at bedtime.    Yes Historical Provider, MD    Allergies:  Allergies  Allergen Reactions  . Codeine Hives and Itching    Social History:   reports that she quit smoking about 4 years ago. Her smoking use included Cigarettes. She has a 30 pack-year  smoking history. She does not have any smokeless tobacco history on file. She reports that she does not drink alcohol or use illicit drugs.  Family History: Family History  Problem Relation Age of Onset  . Heart disease Father   . Cancer Sister   . Hypertension    . Diabetes      Review of Systems: The patient denies anorexia, fever, weight loss, vision loss, decreased hearing, hoarseness, chest pain, syncope, dyspnea on exertion, peripheral edema, balance deficits, hemoptysis, abdominal pain, melena, hematochezia, severe indigestion/heartburn, hematuria, incontinence, genital sores, muscle weakness, suspicious skin lesions, transient blindness, difficulty walking, depression, unusual weight change, abnormal bleeding, enlarged lymph nodes, angioedema, and breast masses.   Physical Exam:  GEN:  Pleasant 66 year old Obese African American female examined  and in no acute distress; cooperative with exam Filed Vitals:   12/23/11 2158 12/24/11 0405 12/24/11 0415 12/24/11 0430  BP: 136/57 123/60 139/53 133/41  Pulse: 56  48 50  Temp: 98.3 F (36.8 C)     TempSrc: Oral     Resp: 18 14 14 19   SpO2: 99%  100% 100%   Blood pressure 133/41, pulse 50, temperature 98.3 F (36.8 C), temperature source Oral, resp. rate 19, SpO2 100.00%. PSYCH: SHe is alert and oriented x4; does not appear anxious does not appear depressed; affect is normal HEENT: Normocephalic and Atraumatic, Mucous membranes pink; PERRLA; EOM intact; Fundi:  Benign;  No scleral icterus, Nares: Patent, Oropharynx: Clear, Fair Dentition, Neck:  FROM, no cervical lymphadenopathy nor thyromegaly or carotid bruit; no JVD; Breasts:: Not examined CHEST WALL: No tenderness CHEST: Normal respiration, clear to auscultation bilaterally HEART: Regular rate and rhythm; no murmurs rubs or gallops BACK: No kyphosis or scoliosis; no CVA tenderness ABDOMEN: Positive Bowel Sounds, Obese, soft non-tender; no masses, no organomegaly.   Rectal  Exam: Not done EXTREMITIES:  No cyanosis, clubbing or edema; no ulcerations. Genitalia: not examined PULSES: 2+ and symmetric SKIN: Normal hydration no rash or ulceration CNS: Cranial nerves 2-12 grossly intact no focal neurologic deficit   Labs & Imaging Results for orders placed during the hospital encounter of 12/23/11 (from the past 48 hour(s))  CBC     Status: Abnormal   Collection Time   12/23/11 11:53 PM      Component Value Range Comment   WBC 9.5  4.0 - 10.5 (K/uL)    RBC 3.90  3.87 - 5.11 (MIL/uL)    Hemoglobin 11.4 (*) 12.0 - 15.0 (g/dL)    HCT 16.1 (*) 09.6 - 46.0 (%)    MCV 90.8  78.0 - 100.0 (fL)    MCH 29.2  26.0 - 34.0 (pg)    MCHC 32.2  30.0 - 36.0 (g/dL)    RDW 04.5 (*) 40.9 - 15.5 (%)    Platelets 203  150 - 400 (K/uL)   COMPREHENSIVE  METABOLIC PANEL     Status: Abnormal   Collection Time   12/23/11 11:53 PM      Component Value Range Comment   Sodium 133 (*) 135 - 145 (mEq/L)    Potassium 3.0 (*) 3.5 - 5.1 (mEq/L)    Chloride 88 (*) 96 - 112 (mEq/L)    CO2 32  19 - 32 (mEq/L)    Glucose, Bld 203 (*) 70 - 99 (mg/dL)    BUN 409 (*) 6 - 23 (mg/dL)    Creatinine, Ser 8.11 (*) 0.50 - 1.10 (mg/dL)    Calcium 91.4  8.4 - 10.5 (mg/dL)    Total Protein 7.2  6.0 - 8.3 (g/dL)    Albumin 3.3 (*) 3.5 - 5.2 (g/dL)    AST 16  0 - 37 (U/L)    ALT 32  0 - 35 (U/L)    Alkaline Phosphatase 68  39 - 117 (U/L)    Total Bilirubin 0.2 (*) 0.3 - 1.2 (mg/dL)    GFR calc non Af Amer 7 (*) >90 (mL/min)    GFR calc Af Amer 8 (*) >90 (mL/min)   POCT I-STAT TROPONIN I     Status: Normal   Collection Time   12/24/11 12:06 AM      Component Value Range Comment   Troponin i, poc 0.03  0.00 - 0.08 (ng/mL)    Comment 3             Dg Chest 2 View  12/24/2011  *RADIOLOGY REPORT*  Clinical Data: Right-sided chest pain.  CHEST - 2 VIEW  Comparison: 01/20 and 12/09/2011  Findings: The patient has chronic cardiomegaly with mild pulmonary vascular prominence without infiltrates or pulmonary  edema.  No acute osseous abnormality.  IMPRESSION: No acute abnormalities.  Chronic cardiomegaly with chronic mild pulmonary vascular prominence.  Original Report Authenticated By: Gwynn Burly, M.D.   Dg Shoulder Right  12/24/2011  *RADIOLOGY REPORT*  Clinical Data: Shoulder pain.  RIGHT SHOULDER - 2+ VIEW  Comparison: None.  Findings: Osseous structures are normal.  No fracture or dislocation or soft tissue calcification.  IMPRESSION: Normal right shoulder.  Original Report Authenticated By: Gwynn Burly, M.D.    EKG:      Assessment:  1.  Chest Pain 2.  Acute on Chronic Diastolic Congestive Heart Failure 3.  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  DM2 5.  HTN 6.  Bradycardia 7.  Mild Hypokalemia  Plan:    Stepdown IV Nitroglycerin drip Pain Control Cardiac Enzymes and Monitoring ASA, and O2 Replete K+ check magnesium.  SSI coverage Consult Dialysis DVT prophylaxis Reconcile meds Other plans as per orders.    CODE STATUS:      FULL CODE        Jaeleen Inzunza C 12/24/2011, 4:40 AM

## 2011-12-25 LAB — BASIC METABOLIC PANEL
Calcium: 9.3 mg/dL (ref 8.4–10.5)
Creatinine, Ser: 5.15 mg/dL — ABNORMAL HIGH (ref 0.50–1.10)
GFR calc Af Amer: 9 mL/min — ABNORMAL LOW (ref >90)
GFR calc non Af Amer: 8 mL/min — ABNORMAL LOW (ref >90)
Sodium: 135 mEq/L (ref 135–145)

## 2011-12-25 LAB — GLUCOSE, CAPILLARY
Glucose-Capillary: 424 mg/dL — ABNORMAL HIGH (ref 70–99)
Glucose-Capillary: 344 mg/dL — ABNORMAL HIGH (ref 70–99)
Glucose-Capillary: 214 mg/dL — ABNORMAL HIGH (ref 70–99)

## 2011-12-25 MED ORDER — INSULIN ASPART 100 UNIT/ML ~~LOC~~ SOLN
24.0000 [IU] | Freq: Once | SUBCUTANEOUS | Status: AC
  Administered 2011-12-25: 10 [IU] via SUBCUTANEOUS

## 2011-12-25 MED ORDER — CALCIUM ACETATE 667 MG PO CAPS
667.0000 mg | ORAL_CAPSULE | Freq: Three times a day (TID) | ORAL | Status: DC
  Filled 2011-12-25 (×3): qty 1

## 2011-12-25 MED ORDER — CITALOPRAM HYDROBROMIDE 40 MG PO TABS
40.0000 mg | ORAL_TABLET | Freq: Every day | ORAL | Status: DC
  Administered 2011-12-25: 40 mg via ORAL
  Filled 2011-12-25: qty 1

## 2011-12-25 MED ORDER — OXYCODONE HCL 5 MG PO TABS: 5.0000 mg | ORAL_TABLET | ORAL | Status: AC | PRN

## 2011-12-25 MED ORDER — INSULIN GLARGINE 100 UNIT/ML ~~LOC~~ SOLN
60.0000 [IU] | Freq: Every day | SUBCUTANEOUS | Status: DC
  Administered 2011-12-25: 60 [IU] via SUBCUTANEOUS
  Filled 2011-12-25: qty 3

## 2011-12-25 MED ORDER — CLONIDINE HCL 0.3 MG PO TABS
0.3000 mg | ORAL_TABLET | Freq: Two times a day (BID) | ORAL | Status: DC
  Administered 2011-12-25: 0.3 mg via ORAL
  Filled 2011-12-25 (×2): qty 1

## 2011-12-25 MED ORDER — INSULIN LISPRO 100 UNIT/ML ~~LOC~~ SOLN: 14.0000 [IU] | Freq: Three times a day (TID) | SUBCUTANEOUS | Status: DC

## 2011-12-25 MED ORDER — INSULIN ASPART 100 UNIT/ML ~~LOC~~ SOLN
14.0000 [IU] | Freq: Three times a day (TID) | SUBCUTANEOUS | Status: DC
  Administered 2011-12-25: 23 [IU] via SUBCUTANEOUS

## 2011-12-25 MED ORDER — ONDANSETRON HCL 4 MG/2ML IJ SOLN: 4.0000 mg | Freq: Four times a day (QID) | INTRAMUSCULAR | Status: DC | PRN

## 2011-12-25 MED ORDER — FUROSEMIDE 80 MG PO TABS
160.0000 mg | ORAL_TABLET | Freq: Two times a day (BID) | ORAL | Status: DC
  Administered 2011-12-25: 160 mg via ORAL
  Filled 2011-12-25 (×3): qty 2

## 2011-12-25 MED ORDER — ASPIRIN EC 81 MG PO TBEC
81.0000 mg | DELAYED_RELEASE_TABLET | Freq: Every day | ORAL | Status: DC
  Administered 2011-12-25: 81 mg via ORAL
  Filled 2011-12-25: qty 1

## 2011-12-25 MED ORDER — POTASSIUM CHLORIDE CRYS ER 20 MEQ PO TBCR
20.0000 meq | EXTENDED_RELEASE_TABLET | Freq: Every day | ORAL | Status: DC
  Administered 2011-12-25: 20 meq via ORAL
  Filled 2011-12-25: qty 1

## 2011-12-25 NOTE — Discharge Summary (Signed)
DISCHARGE SUMMARY  [name]  MR#: [id number]  DOB:1946/04/05  Date of Admission: 12/23/2011 Date of Discharge: 12/25/2011  Attending Physician:Jaelene Garciagarcia  Patient's ONG:EXBMWUX,LKGMWNU DAVID, MD, MD  Consults: none  Presenting Complaint: Right sided chest pain, likely muscular  Discharge Diagnoses: Active Problems:  DYSLIPIDEMIA  Overweight  HYPERTENSION, UNSPECIFIED  Chronic diastolic heart failure  Renal failure, chronic    Discharge Medications: Medication List  As of 12/25/2011  1:53 PM   TAKE these medications         albuterol 108 (90 BASE) MCG/ACT inhaler   Commonly known as: PROVENTIL HFA;VENTOLIN HFA   Inhale 2 puffs into the lungs every 6 (six) hours as needed. For wheezing      aspirin 81 MG tablet   Take 81 mg by mouth daily.      atorvastatin 40 MG tablet   Commonly known as: LIPITOR   Take 1 tablet by mouth every morning.      calcium acetate 667 MG capsule   Commonly known as: PHOSLO   Take 1 tablet by mouth 3 (three) times daily with meals.      citalopram 40 MG tablet   Commonly known as: CELEXA   Take 1 tablet by mouth daily.      cloNIDine 0.3 MG tablet   Commonly known as: CATAPRES   Take 0.3 mg by mouth 2 (two) times daily.      esomeprazole 40 MG capsule   Commonly known as: NEXIUM   Take 40 mg by mouth 3 (three) times a week. Take 1 capsule on Mondays, Wednesday, and Fridays.      furosemide 80 MG tablet   Commonly known as: LASIX   Take 160 mg by mouth 2 (two) times daily.      insulin glargine 100 UNIT/ML injection   Commonly known as: LANTUS   Inject 60 Units into the skin daily.      insulin lispro 100 UNIT/ML injection   Commonly known as: HUMALOG   Inject 14-26 Units into the skin 3 (three) times daily. Sliding scale as directed: 70-150=14 units, 151-200=17 units, 210-250=20 units, 251-300=23 units & >300=26 units      loratadine 10 MG tablet   Commonly known as: CLARITIN   Take 10 mg by mouth daily.     metolazone 5 MG tablet   Commonly known as: ZAROXOLYN   Take 5 mg by mouth every Monday, Wednesday, and Friday.      oxyCODONE 5 MG immediate release tablet   Commonly known as: Oxy IR/ROXICODONE   Take 1 tablet (5 mg total) by mouth every 4 (four) hours as needed.      paricalcitol 1 MCG capsule   Commonly known as: ZEMPLAR   Take 1 mcg by mouth every Monday, Wednesday, and Friday.      potassium chloride SA 20 MEQ tablet   Commonly known as: K-DUR,KLOR-CON   Take 20 mEq by mouth daily.      pregabalin 50 MG capsule   Commonly known as: LYRICA   Take 50 mg by mouth at bedtime.      traMADol 50 MG tablet   Commonly known as: ULTRAM   Take 50 mg by mouth every 12 (twelve) hours as needed. For pain      triamcinolone 55 MCG/ACT nasal inhaler   Commonly known as: NASACORT   Place 2 sprays into the nose daily as needed. For nasal congestion      vitamin B-12 1000 MCG tablet   Commonly known  as: CYANOCOBALAMIN   Take 1,000 mcg by mouth at bedtime.      Vitamin D3 5000 UNITS Caps   Take 2 capsules by mouth daily.             Procedures: Dg Chest 2 View  12/24/2011  *RADIOLOGY REPORT*  Clinical Data: Right-sided chest pain.  CHEST - 2 VIEW  Comparison: 01/20 and 12/09/2011  Findings: The patient has chronic cardiomegaly with mild pulmonary vascular prominence without infiltrates or pulmonary edema.  No acute osseous abnormality.  IMPRESSION: No acute abnormalities.  Chronic cardiomegaly with chronic mild pulmonary vascular prominence.  Original Report Authenticated By: Gwynn Burly, M.D.   Dg Chest 2 View  12/10/2011  *RADIOLOGY REPORT*  Clinical Data: Shortness of breath.  History of CHF.  CHEST - 2 VIEW  Comparison: 06/15/2011  Findings: There is cardiomegaly with vascular congestion.  No definite overt edema.  No focal opacities or effusions.  No acute bony abnormality.  IMPRESSION: Cardiomegaly, vascular congestion.  Original Report Authenticated By: Cyndie Chime, M.D.    Dg Shoulder Right  12/24/2011  *RADIOLOGY REPORT*  Clinical Data: Shoulder pain.  RIGHT SHOULDER - 2+ VIEW  Comparison: None.  Findings: Osseous structures are normal.  No fracture or dislocation or soft tissue calcification.  IMPRESSION: Normal right shoulder.  Original Report Authenticated By: Gwynn Burly, M.D.   Dg Chest Portable 1 View  12/11/2011  *RADIOLOGY REPORT*  Clinical Data: Bradycardia.  Cough and congestion.  PORTABLE CHEST - 1 VIEW  Comparison: PA and lateral chest 12/09/2011.  Findings: There is cardiomegaly with some vascular congestion.  No pneumothorax or effusion.  No consolidative process.  IMPRESSION: Cardiomegaly and pulmonary vascular congestion.  Original Report Authenticated By: Bernadene Bell. D'ALESSIO, M.D.   Dg Foot Complete Right  12/10/2011  *RADIOLOGY REPORT*  Clinical Data: Pain, swelling.  RIGHT FOOT COMPLETE - 3+ VIEW  Comparison: None.  Findings: No acute bony abnormality.  Specifically, no fracture, subluxation, or dislocation.  Soft tissues are intact.  Plantar calcaneal spur.  IMPRESSION: No acute bony abnormality.  Original Report Authenticated By: Cyndie Chime, M.D.    Hospital Course: Chest pain- this was present on the right side of the chest and described as "pulling" . It was a constant pain which had been present for 2 day prior to presenting. It was noted to change when she moved her right arm and was associated with pain in the shoulder.  She was admitted and started on a Nitro drip but continued to have the pain.It is relieved only with Oxycodone.   She has been ruled out for am MI with 3 sets of cardiac enzymes. She is advised to f/u with her PCP if the pain continues.  I have given her prescriptions for Oxycodone as this is all that relieves her pain.   Active Problems:  DYSLIPIDEMIA  Overweight  HYPERTENSION, UNSPECIFIED  Chronic diastolic heart failure  Renal failure, chronic    Day of Discharge Physical Exam: BP 103/44  Pulse 61   Temp(Src) 98.2 F (36.8 C) (Oral)  Resp 15  Ht 5' 5.6" (1.666 m)  Wt 91.627 kg (202 lb)  BMI 33.00 kg/m2  SpO2 97% HEENT: Normocephalic and Atraumatic, Mucous membranes pink; PERRLA; EOM intact; Fundi: Benign; No scleral icterus, Nares: Patent, Oropharynx: Clear, Fair Dentition  CHEST: Normal respiration, clear to auscultation bilaterally  HEART: Regular rate and rhythm; no murmurs rubs or galllops ABDOMEN: Positive Bowel Sounds, Obese, soft non-tender; no masses, no organomegaly. EXTREMITIES: No  cyanosis, clubbing or edema; no ulcerations.   Results for orders placed during the hospital encounter of 12/23/11 (from the past 24 hour(s))  GLUCOSE, CAPILLARY     Status: Abnormal   Collection Time   12/24/11  5:00 PM      Component Value Range   Glucose-Capillary 188 (*) 70 - 99 (mg/dL)  CARDIAC PANEL(CRET KIN+CKTOT+MB+TROPI)     Status: Abnormal   Collection Time   12/24/11  9:26 PM      Component Value Range   Total CK 126  7 - 177 (U/L)   CK, MB 4.4 (*) 0.3 - 4.0 (ng/mL)   Troponin I <0.30  <0.30 (ng/mL)   Relative Index 3.5 (*) 0.0 - 2.5   GLUCOSE, CAPILLARY     Status: Abnormal   Collection Time   12/24/11 10:24 PM      Component Value Range   Glucose-Capillary 230 (*) 70 - 99 (mg/dL)  GLUCOSE, CAPILLARY     Status: Abnormal   Collection Time   12/25/11  7:52 AM      Component Value Range   Glucose-Capillary 424 (*) 70 - 99 (mg/dL)   Comment 1 Notify RN    BASIC METABOLIC PANEL     Status: Abnormal   Collection Time   12/25/11  9:05 AM      Component Value Range   Sodium 135  135 - 145 (mEq/L)   Potassium 3.5  3.5 - 5.1 (mEq/L)   Chloride 92 (*) 96 - 112 (mEq/L)   CO2 26  19 - 32 (mEq/L)   Glucose, Bld 390 (*) 70 - 99 (mg/dL)   BUN 161 (*) 6 - 23 (mg/dL)   Creatinine, Ser 0.96 (*) 0.50 - 1.10 (mg/dL)   Calcium 9.3  8.4 - 04.5 (mg/dL)   GFR calc non Af Amer 8 (*) >90 (mL/min)   GFR calc Af Amer 9 (*) >90 (mL/min)  GLUCOSE, CAPILLARY     Status: Abnormal   Collection Time     12/25/11 10:17 AM      Component Value Range   Glucose-Capillary 344 (*) 70 - 99 (mg/dL)  GLUCOSE, CAPILLARY     Status: Abnormal   Collection Time   12/25/11 12:05 PM      Component Value Range   Glucose-Capillary 214 (*) 70 - 99 (mg/dL)    Disposition: stable   Follow-up Appts: Discharge Orders    Future Appointments: Provider: Department: Dept Phone: Center:   01/09/2012 4:15 PM Leslye Peer, MD [facility] [phone] None     Future Orders Please Complete By Expires   Diet - low sodium heart healthy      Comments:   [employer]   Increase activity slowly      Discharge instructions      Comments:   Follow up with Orthopedics for pain in right shoulder and right chest.      Follow-up with Dr Lucky Cowboy and your orthopedic surgeon in 1-2 weeks Time on Discharge: >45 min  Signed: [facility] 12/25/2011, 1:53 PM

## 2011-12-26 NOTE — Progress Notes (Signed)
Utilization Review Completed.[name] T2/02/2012   

## 2012-01-06 ENCOUNTER — Encounter (HOSPITAL_COMMUNITY)
Admission: RE | Admit: 2012-01-06 | Discharge: 2012-01-06 | Disposition: A | Discharge status: Home / Self Care | Payer: Medicare Other | Source: Ambulatory Visit | Attending: Nephrology | Admitting: Nephrology

## 2012-01-06 DIAGNOSIS — N184 Chronic kidney disease, stage 4 (severe): Secondary | ICD-10-CM | POA: Insufficient documentation

## 2012-01-06 DIAGNOSIS — D638 Anemia in other chronic diseases classified elsewhere: Secondary | ICD-10-CM | POA: Insufficient documentation

## 2012-01-06 LAB — IRON AND TIBC
Iron: 49 ug/dL (ref 42–135)
Saturation Ratios: 16 % — ABNORMAL LOW (ref 20–55)
TIBC: 311 ug/dL (ref 250–470)
UIBC: 262 ug/dL (ref 125–400)

## 2012-01-06 MED ORDER — DARBEPOETIN ALFA-POLYSORBATE 100 MCG/0.5ML IJ SOLN
INTRAMUSCULAR | Status: AC
  Filled 2012-01-06: qty 0.5

## 2012-01-06 MED ORDER — DARBEPOETIN ALFA-POLYSORBATE 100 MCG/0.5ML IJ SOLN
100.0000 ug | INTRAMUSCULAR | Status: DC
  Administered 2012-01-06: 100 ug via SUBCUTANEOUS

## 2012-01-09 ENCOUNTER — Ambulatory Visit (INDEPENDENT_AMBULATORY_CARE_PROVIDER_SITE_OTHER): Payer: Medicare Other | Admitting: Emergency Medicine

## 2012-01-09 ENCOUNTER — Encounter: Payer: Self-pay | Admitting: Emergency Medicine

## 2012-01-09 DIAGNOSIS — J449 Chronic obstructive pulmonary disease, unspecified: Secondary | ICD-10-CM

## 2012-01-09 DIAGNOSIS — R05 Cough: Secondary | ICD-10-CM

## 2012-01-09 NOTE — Patient Instructions (Signed)
Please continue your nasal saline washes, nasocort, and loratadine Please continue your nexium Use your oxygen with exertion Use your Ventolin inhaler 2 puffs if needed for shortness of breath Follow with Dr Delton Coombes in 6 months or sooner if you have any problems

## 2012-01-09 NOTE — Progress Notes (Signed)
66 yobf quit smoking after hospitalization 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   ROV 07/22/10 -- 66 yo woman. Hx mild COPD, CHF, chronic cough. Presented last time with refractory cough - Dr Sherene Sires held Spiriva, fexofenadine, atenolol. New b-b;locker per cards is coreg. Drainage much improved with chlorphenerimine. Cough is much improved. She doesn't miss the Spiriva. She was started on supplimental O2 during hosp admission last week.   ROV 03/22/11 -- 66 yo woman, mixed disease on PFT, hx CHF, chronic cough. Has been taking OTC decongestants without much relief of congestion, sore throat, cough. Started taking loratadine last week. Has been doing nasal saline washes prn.   ROV 04/20/11 - Acute OV, mixed disease, chronic cough. Last time we treated allergies - NSW, nasal steroid, loratadine for flare of coughing. Still takes Nexium. She reports that her cough eased up a bit for a few days, but then went back to same daily cough, productive of clear mucous. Seems to be worse at night. She has some sinus pain. >>tx w/ claritin and nasal rinses and flonase   ROV 05/05/11 Acute OV  Pt presents for an acute office visit. Complains of persistent cough, congestion and drainage. Cough is keeping her up night.  Using no meds for cough. Mucus is yellow and white. Mucus is very thick.  CT sinus done last ov was neg for acute changes.  Cough and congestion are getting worse.   ROV 05/12/11 -- 66 woman, former tobacco, mixed dz on Spirio, chronic cough with allergic rhinitis. She was seen last week with refractory cough, prod of tan mucous, treated for acute bronchitis and is now improved. She remains on loratadine + flonase + [place]. Also on nexium bid. Not on any inhaled medications. She is preparing to get cataract   ROV 07/11/11 -- COPD/mixed disease, chronic cough and allergies. She has been coughing worse for over 2 weeks. More nasal congestion, more pharyngeal drainage, sore throat. Has been taking her  fluticasone, loratadine, NSW's. She tells me that since last time she has been admitted to hospital for CHF exacerbation. No fevers, no purulent secretions. Still on nexium bid.   ROV 08/23/11 -- COPD, coexisting restriction, chronic cough, allergies. Last time changed fluticasone to astopro, continued NSW + loratadine = nexium. Also rx with azithro x 5 days. Still w PND  ROV 01/09/12 -- COPD, coexisting restriction, chronic cough, allergies. Was just hospitalized for CP and gout. She continues to take Nexium daily, allergy regimen >> NSW, Nasal steroid, loratadine. She uses albuterol very rarely.   Gen: Pleasant, obese, in no distress,  normal affect  ENT: No lesions,  mouth clear,  oropharynx clear, no postnasal drip  Neck: No JVD, no TMG, no carotid bruits  Lungs: No use of accessory muscles, no dullness to percussion, clear without rales or rhonchi  Cardiovascular: RRR, heart sounds normal, no murmur or gallops, no peripheral edema  Musculoskeletal: No deformities, no cyanosis or clubbing  Neuro: alert, non focal  Skin: Warm, no lesions or rashes  COUGH Continue current regimen >> allergy meds and PPI  COPD, MILD SABA prn

## 2012-01-09 NOTE — Assessment & Plan Note (Signed)
SABA prn  

## 2012-01-09 NOTE — Assessment & Plan Note (Signed)
Continue current regimen >> allergy meds and PPI

## 2012-01-16 ENCOUNTER — Other Ambulatory Visit (HOSPITAL_COMMUNITY): Payer: Self-pay | Admitting: *Deleted

## 2012-01-20 ENCOUNTER — Encounter (HOSPITAL_COMMUNITY)
Admission: RE | Admit: 2012-01-20 | Discharge: 2012-01-20 | Disposition: A | Discharge status: Home / Self Care | Payer: Medicare Other | Source: Ambulatory Visit | Attending: Nephrology | Admitting: Nephrology

## 2012-01-20 DIAGNOSIS — D638 Anemia in other chronic diseases classified elsewhere: Secondary | ICD-10-CM | POA: Insufficient documentation

## 2012-01-20 DIAGNOSIS — N184 Chronic kidney disease, stage 4 (severe): Secondary | ICD-10-CM | POA: Insufficient documentation

## 2012-01-20 LAB — RENAL FUNCTION PANEL
BUN: 76 mg/dL — ABNORMAL HIGH (ref 6–23)
Glucose, Bld: 69 mg/dL — ABNORMAL LOW (ref 70–99)
Phosphorus: 4.1 mg/dL (ref 2.3–4.6)
Potassium: 3.5 mEq/L (ref 3.5–5.1)

## 2012-01-20 LAB — POCT HEMOGLOBIN-HEMACUE: Hemoglobin: 12 g/dL (ref 12.0–15.0)

## 2012-02-03 ENCOUNTER — Encounter (HOSPITAL_COMMUNITY)
Admission: RE | Admit: 2012-02-03 | Discharge: 2012-02-03 | Disposition: A | Discharge status: Home / Self Care | Payer: Medicare Other | Source: Ambulatory Visit | Attending: Nephrology | Admitting: Nephrology

## 2012-02-03 LAB — IRON AND TIBC
Iron: 126 ug/dL (ref 42–135)
Saturation Ratios: 41 % (ref 20–55)
TIBC: 307 ug/dL (ref 250–470)
UIBC: 181 ug/dL (ref 125–400)

## 2012-02-03 MED ORDER — DARBEPOETIN ALFA-POLYSORBATE 100 MCG/0.5ML IJ SOLN: 100.0000 ug | INTRAMUSCULAR | Status: DC

## 2012-02-17 ENCOUNTER — Encounter (HOSPITAL_COMMUNITY)
Admission: RE | Admit: 2012-02-17 | Discharge: 2012-02-17 | Disposition: A | Discharge status: Home / Self Care | Payer: Medicare Other | Source: Ambulatory Visit | Attending: Nephrology | Admitting: Nephrology

## 2012-02-17 LAB — RENAL FUNCTION PANEL
BUN: 87 mg/dL — ABNORMAL HIGH (ref 6–23)
Calcium: 9.4 mg/dL (ref 8.4–10.5)
Creatinine, Ser: 3.47 mg/dL — ABNORMAL HIGH (ref 0.50–1.10)
Glucose, Bld: 114 mg/dL — ABNORMAL HIGH (ref 70–99)
Phosphorus: 5.6 mg/dL — ABNORMAL HIGH (ref 2.3–4.6)
Sodium: 142 mEq/L (ref 135–145)

## 2012-02-17 MED ORDER — DARBEPOETIN ALFA-POLYSORBATE 100 MCG/0.5ML IJ SOLN: 100.0000 ug | INTRAMUSCULAR | Status: DC

## 2012-03-02 ENCOUNTER — Encounter (HOSPITAL_COMMUNITY)
Admission: RE | Admit: 2012-03-02 | Discharge: 2012-03-02 | Disposition: A | Discharge status: Home / Self Care | Payer: Medicare Other | Source: Ambulatory Visit | Attending: Nephrology | Admitting: Nephrology

## 2012-03-02 DIAGNOSIS — D638 Anemia in other chronic diseases classified elsewhere: Secondary | ICD-10-CM | POA: Insufficient documentation

## 2012-03-02 DIAGNOSIS — N184 Chronic kidney disease, stage 4 (severe): Secondary | ICD-10-CM | POA: Insufficient documentation

## 2012-03-02 LAB — IRON AND TIBC
Saturation Ratios: 36 % (ref 20–55)
UIBC: 161 ug/dL (ref 125–400)

## 2012-03-02 LAB — FERRITIN: Ferritin: 270 ng/mL (ref 10–291)

## 2012-03-02 MED ORDER — DARBEPOETIN ALFA-POLYSORBATE 100 MCG/0.5ML IJ SOLN
100.0000 ug | INTRAMUSCULAR | Status: DC
  Administered 2012-03-02: 100 ug via SUBCUTANEOUS
  Filled 2012-03-02: qty 0.5

## 2012-03-14 ENCOUNTER — Other Ambulatory Visit (HOSPITAL_COMMUNITY): Payer: Self-pay | Admitting: *Deleted

## 2012-03-16 ENCOUNTER — Encounter (HOSPITAL_COMMUNITY)
Admission: RE | Admit: 2012-03-16 | Discharge: 2012-03-16 | Disposition: A | Discharge status: Home / Self Care | Payer: Medicare Other | Source: Ambulatory Visit | Attending: Nephrology | Admitting: Nephrology

## 2012-03-16 LAB — RENAL FUNCTION PANEL
BUN: 64 mg/dL — ABNORMAL HIGH (ref 6–23)
Chloride: 94 mEq/L — ABNORMAL LOW (ref 96–112)
Glucose, Bld: 88 mg/dL (ref 70–99)
Phosphorus: 4 mg/dL (ref 2.3–4.6)
Potassium: 3.3 mEq/L — ABNORMAL LOW (ref 3.5–5.1)

## 2012-03-16 LAB — POCT HEMOGLOBIN-HEMACUE: Hemoglobin: 11.5 g/dL — ABNORMAL LOW (ref 12.0–15.0)

## 2012-03-16 MED ORDER — DARBEPOETIN ALFA-POLYSORBATE 100 MCG/0.5ML IJ SOLN: 100.0000 ug | INTRAMUSCULAR | Status: DC

## 2012-03-16 MED ORDER — DARBEPOETIN ALFA-POLYSORBATE 100 MCG/0.5ML IJ SOLN
INTRAMUSCULAR | Status: AC
  Administered 2012-03-16: 100 ug via SUBCUTANEOUS
  Filled 2012-03-16: qty 0.5

## 2012-03-30 ENCOUNTER — Encounter (HOSPITAL_COMMUNITY)
Admission: RE | Admit: 2012-03-30 | Discharge: 2012-03-30 | Disposition: A | Discharge status: Home / Self Care | Payer: Medicare Other | Source: Ambulatory Visit | Attending: Nephrology | Admitting: Nephrology

## 2012-03-30 DIAGNOSIS — N184 Chronic kidney disease, stage 4 (severe): Secondary | ICD-10-CM | POA: Insufficient documentation

## 2012-03-30 DIAGNOSIS — D638 Anemia in other chronic diseases classified elsewhere: Secondary | ICD-10-CM | POA: Insufficient documentation

## 2012-03-30 LAB — IRON AND TIBC
Iron: 61 ug/dL (ref 42–135)
Saturation Ratios: 30 % (ref 20–55)
UIBC: 143 ug/dL (ref 125–400)

## 2012-03-30 MED ORDER — DARBEPOETIN ALFA-POLYSORBATE 100 MCG/0.5ML IJ SOLN
100.0000 ug | INTRAMUSCULAR | Status: DC
  Administered 2012-03-30: 100 ug via SUBCUTANEOUS

## 2012-03-30 MED ORDER — DARBEPOETIN ALFA-POLYSORBATE 100 MCG/0.5ML IJ SOLN
INTRAMUSCULAR | Status: AC
  Administered 2012-03-30: 100 ug via SUBCUTANEOUS
  Filled 2012-03-30: qty 0.5

## 2012-04-13 ENCOUNTER — Encounter (HOSPITAL_COMMUNITY)
Admission: RE | Admit: 2012-04-13 | Discharge: 2012-04-13 | Disposition: A | Discharge status: Home / Self Care | Payer: Medicare Other | Source: Ambulatory Visit | Attending: Nephrology | Admitting: Nephrology

## 2012-04-13 LAB — RENAL FUNCTION PANEL
BUN: 66 mg/dL — ABNORMAL HIGH (ref 6–23)
Calcium: 9.6 mg/dL (ref 8.4–10.5)
Creatinine, Ser: 3.22 mg/dL — ABNORMAL HIGH (ref 0.50–1.10)
Glucose, Bld: 248 mg/dL — ABNORMAL HIGH (ref 70–99)
Phosphorus: 4.9 mg/dL — ABNORMAL HIGH (ref 2.3–4.6)

## 2012-04-13 MED ORDER — DARBEPOETIN ALFA-POLYSORBATE 100 MCG/0.5ML IJ SOLN
100.0000 ug | INTRAMUSCULAR | Status: DC
  Administered 2012-04-13: 100 ug via SUBCUTANEOUS

## 2012-04-13 MED ORDER — DARBEPOETIN ALFA-POLYSORBATE 100 MCG/0.5ML IJ SOLN
INTRAMUSCULAR | Status: AC
  Filled 2012-04-13: qty 0.5

## 2012-04-27 ENCOUNTER — Encounter (HOSPITAL_COMMUNITY)
Admission: RE | Admit: 2012-04-27 | Discharge: 2012-04-27 | Disposition: A | Discharge status: Home / Self Care | Payer: Medicare Other | Source: Ambulatory Visit | Attending: Nephrology | Admitting: Nephrology

## 2012-04-27 DIAGNOSIS — D638 Anemia in other chronic diseases classified elsewhere: Secondary | ICD-10-CM | POA: Insufficient documentation

## 2012-04-27 DIAGNOSIS — N184 Chronic kidney disease, stage 4 (severe): Secondary | ICD-10-CM | POA: Insufficient documentation

## 2012-04-27 LAB — FERRITIN: Ferritin: 191 ng/mL (ref 10–291)

## 2012-04-27 LAB — IRON AND TIBC
Iron: 107 ug/dL (ref 42–135)
TIBC: 261 ug/dL (ref 250–470)

## 2012-04-27 LAB — POCT HEMOGLOBIN-HEMACUE: Hemoglobin: 11.6 g/dL — ABNORMAL LOW (ref 12.0–15.0)

## 2012-04-27 MED ORDER — DARBEPOETIN ALFA-POLYSORBATE 100 MCG/0.5ML IJ SOLN
INTRAMUSCULAR | Status: AC
  Filled 2012-04-27: qty 0.5

## 2012-04-27 MED ORDER — DARBEPOETIN ALFA-POLYSORBATE 100 MCG/0.5ML IJ SOLN
100.0000 ug | INTRAMUSCULAR | Status: DC
  Administered 2012-04-27: 100 ug via SUBCUTANEOUS

## 2012-05-11 ENCOUNTER — Encounter (HOSPITAL_COMMUNITY)
Admission: RE | Admit: 2012-05-11 | Discharge: 2012-05-11 | Disposition: A | Discharge status: Home / Self Care | Payer: Medicare Other | Source: Ambulatory Visit | Attending: Nephrology | Admitting: Nephrology

## 2012-05-11 LAB — RENAL FUNCTION PANEL
Albumin: 3.5 g/dL (ref 3.5–5.2)
BUN: 73 mg/dL — ABNORMAL HIGH (ref 6–23)
CO2: 26 mEq/L (ref 19–32)
Chloride: 97 mEq/L (ref 96–112)
Creatinine, Ser: 3.57 mg/dL — ABNORMAL HIGH (ref 0.50–1.10)
GFR calc non Af Amer: 12 mL/min — ABNORMAL LOW (ref >90)
Potassium: 3.4 mEq/L — ABNORMAL LOW (ref 3.5–5.1)

## 2012-05-11 LAB — POCT HEMOGLOBIN-HEMACUE: Hemoglobin: 12.2 g/dL (ref 12.0–15.0)

## 2012-05-11 MED ORDER — DARBEPOETIN ALFA-POLYSORBATE 100 MCG/0.5ML IJ SOLN: 100.0000 ug | INTRAMUSCULAR | Status: DC

## 2012-05-22 ENCOUNTER — Ambulatory Visit (INDEPENDENT_AMBULATORY_CARE_PROVIDER_SITE_OTHER): Payer: Medicare Other | Admitting: Adult Health

## 2012-05-22 ENCOUNTER — Encounter: Payer: Self-pay | Admitting: Adult Health

## 2012-05-22 VITALS — BP 128/64 | HR 68 | Temp 97.6°F | Ht 65.0 in | Wt 193.0 lb

## 2012-05-22 DIAGNOSIS — J449 Chronic obstructive pulmonary disease, unspecified: Secondary | ICD-10-CM

## 2012-05-22 MED ORDER — AZITHROMYCIN 250 MG PO TABS: ORAL_TABLET | ORAL | Status: DC

## 2012-05-22 MED ORDER — HYDROCODONE-HOMATROPINE 5-1.5 MG/5ML PO SYRP: 5.0000 mL | ORAL_SOLUTION | Freq: Four times a day (QID) | ORAL | Status: AC | PRN

## 2012-05-22 MED ORDER — PREDNISONE 10 MG PO TABS: ORAL_TABLET | ORAL | Status: DC

## 2012-05-22 MED ORDER — DOXYCYCLINE HYCLATE 100 MG PO TABS: 100.0000 mg | ORAL_TABLET | Freq: Two times a day (BID) | ORAL | Status: AC

## 2012-05-22 NOTE — Patient Instructions (Addendum)
Doxycycline 100mg  Twice daily  For 7 days  Prednisone taper over next week.  Mucinex DM Twice daily  As needed  Cough/congestion  Hydromet 1-2 tsp every 4-6 hr As needed  Cough-may make you sleepy.  Please contact office for sooner follow up if symptoms do not improve or worsen or seek emergency care  follow up Dr. Delton Coombes  As planned  And As needed

## 2012-05-22 NOTE — Progress Notes (Signed)
65 yobf quit smoking after hospitalization [facility] 2009, mixed dz by PFT, chronic cough.   ROV 07/22/10 -- 66 yo woman. Hx mild COPD, CHF, chronic cough. Presented last time with refractory cough - Dr Sherene Sires held Spiriva, fexofenadine, atenolol. New b-b;locker per cards is coreg. Drainage much improved with chlorphenerimine. Cough is much improved. She doesn't miss the Spiriva. She was started on supplimental O2 during hosp admission last week.   ROV 03/22/11 -- 66 yo woman, mixed disease on PFT, hx CHF, chronic cough. Has been taking OTC decongestants without much relief of congestion, sore throat, cough. Started taking loratadine last week. Has been doing nasal saline washes prn.   ROV 04/20/11 - Acute OV, mixed disease, chronic cough. Last time we treated allergies - NSW, nasal steroid, loratadine for flare of coughing. Still takes Nexium. She reports that her cough eased up a bit for a few days, but then went back to same daily cough, productive of clear mucous. Seems to be worse at night. She has some sinus pain. >>tx w/ claritin and nasal rinses and flonase   ROV 05/05/11 Acute OV  Pt presents for an acute office visit. Complains of persistent cough, congestion and drainage. Cough is keeping her up night.  Using no meds for cough. Mucus is yellow and white. Mucus is very thick.  CT sinus done last ov was neg for acute changes.  Cough and congestion are getting worse.   ROV 05/12/11 -- 65 woman, former tobacco, mixed dz on Spirio, chronic cough with allergic rhinitis. She was seen last week with refractory cough, prod of tan mucous, treated for acute bronchitis and is now improved. She remains on loratadine + flonase + [place]. Also on nexium bid. Not on any inhaled medications. She is preparing to get cataract   ROV 07/11/11 -- COPD/mixed disease, chronic cough and allergies. She has been coughing worse for over 2 weeks. More nasal congestion, more pharyngeal drainage, sore throat. Has been taking her  fluticasone, loratadine, NSW's. She tells me that since last time she has been admitted to hospital for CHF exacerbation. No fevers, no purulent secretions. Still on nexium bid.   ROV 08/23/11 -- COPD, coexisting restriction, chronic cough, allergies. Last time changed fluticasone to astopro, continued NSW + loratadine = nexium. Also rx with azithro x 5 days. Still w PND  ROV 01/09/12 -- COPD, coexisting restriction, chronic cough, allergies. Was just hospitalized for CP and gout. She continues to take Nexium daily, allergy regimen >> NSW, Nasal steroid, loratadine. She uses albuterol very rarely.    05/22/2012 Acute OV  Complains of head congestion, clear nasal drainage, occ prod cough with white/clear mucus x2 weeks . Cough is severe at times. -takes her breath she coughs so hard.  Has a lot of drainage throat.  No fever or chest pain .  No edema.    ROS:  Constitutional:   No  weight loss, night sweats,  Fevers, chills,  +fatigue, or  lassitude.  HEENT:   No headaches,  Difficulty swallowing,  Tooth/dental problems, or  Sore throat,                No sneezing, itching, ear ache,  +nasal congestion, post nasal drip,   CV:  No chest pain,  Orthopnea, PND, swelling in lower extremities, anasarca, dizziness, palpitations, syncope.   GI  No heartburn, indigestion, abdominal pain, nausea, vomiting, diarrhea, change in bowel habits, loss of appetite, bloody stools.   Resp:    No coughing up of  blood.     No chest wall deformity  Skin: no rash or lesions.  GU: no dysuria, change in color of urine, no urgency or frequency.  No flank pain, no hematuria   MS:  No joint pain or swelling.  No decreased range of motion.  No back pain.  Psych:  No change in mood or affect. No depression or anxiety.  No memory loss.       EXAM :  Gen: Pleasant, obese, in no distress,  normal affect  ENT: No lesions,  mouth clear,  oropharynx clear, ++clear  postnasal drip  Neck: No JVD, no TMG, no  carotid bruits  Lungs: No use of accessory muscles, no dullness to percussion, coarse BS w/ barking cough  Cardiovascular: RRR, heart sounds normal, no murmur or gallops, no peripheral edema  Musculoskeletal: No deformities, no cyanosis or clubbing  Neuro: alert, non focal  Skin: Warm, no lesions or rashes

## 2012-05-23 NOTE — Assessment & Plan Note (Signed)
Flare with associated Rhinitis   Plan;  Doxycycline 100mg  Twice daily  For 7 days  Prednisone taper over next week.  Mucinex DM Twice daily  As needed  Cough/congestion  Hydromet 1-2 tsp every 4-6 hr As needed  Cough-may make you sleepy.  Please contact office for sooner follow up if symptoms do not improve or worsen or seek emergency care  follow up Dr. Delton Coombes  As planned  And As needed

## 2012-05-25 ENCOUNTER — Encounter (HOSPITAL_COMMUNITY)
Admission: RE | Admit: 2012-05-25 | Discharge: 2012-05-25 | Disposition: A | Discharge status: Home / Self Care | Payer: Medicare Other | Source: Ambulatory Visit | Attending: Nephrology | Admitting: Nephrology

## 2012-05-25 DIAGNOSIS — D638 Anemia in other chronic diseases classified elsewhere: Secondary | ICD-10-CM | POA: Insufficient documentation

## 2012-05-25 DIAGNOSIS — N184 Chronic kidney disease, stage 4 (severe): Secondary | ICD-10-CM | POA: Insufficient documentation

## 2012-05-25 LAB — IRON AND TIBC
Iron: 91 ug/dL (ref 42–135)
TIBC: 269 ug/dL (ref 250–470)

## 2012-05-25 MED ORDER — DARBEPOETIN ALFA-POLYSORBATE 100 MCG/0.5ML IJ SOLN
100.0000 ug | INTRAMUSCULAR | Status: DC
Start: 2012-05-25 — End: 2012-05-26
  Administered 2012-05-25: 100 ug via SUBCUTANEOUS
  Filled 2012-05-25: qty 0.5

## 2012-06-08 ENCOUNTER — Encounter (HOSPITAL_COMMUNITY): Payer: Medicare Other

## 2012-06-18 ENCOUNTER — Encounter (HOSPITAL_COMMUNITY)
Admission: RE | Admit: 2012-06-18 | Discharge: 2012-06-18 | Disposition: A | Discharge status: Home / Self Care | Payer: Medicare Other | Source: Ambulatory Visit | Attending: Nephrology | Admitting: Nephrology

## 2012-06-18 ENCOUNTER — Other Ambulatory Visit: Payer: Self-pay | Admitting: Cardiology

## 2012-06-18 LAB — RENAL FUNCTION PANEL
Albumin: 3.5 g/dL (ref 3.5–5.2)
BUN: 68 mg/dL — ABNORMAL HIGH (ref 6–23)
Calcium: 9.4 mg/dL (ref 8.4–10.5)
Glucose, Bld: 223 mg/dL — ABNORMAL HIGH (ref 70–99)
Phosphorus: 3.6 mg/dL (ref 2.3–4.6)
Potassium: 3.7 mEq/L (ref 3.5–5.1)

## 2012-06-18 MED ORDER — DARBEPOETIN ALFA-POLYSORBATE 100 MCG/0.5ML IJ SOLN: 100.0000 ug | INTRAMUSCULAR | Status: DC

## 2012-06-18 NOTE — Telephone Encounter (Signed)
..   Requested Prescriptions   Pending Prescriptions Disp Refills  . atorvastatin (LIPITOR) 40 MG tablet [Pharmacy Med Name: ATORVASTATIN 40 MG TABLET] 30 tablet 3    Sig: take 1 tablet by mouth once daily  Please call the office to make appointment for October 2013

## 2012-06-29 ENCOUNTER — Other Ambulatory Visit (HOSPITAL_COMMUNITY): Payer: Self-pay | Admitting: *Deleted

## 2012-07-02 ENCOUNTER — Encounter (HOSPITAL_COMMUNITY): Payer: Medicare Other

## 2012-07-10 ENCOUNTER — Ambulatory Visit (INDEPENDENT_AMBULATORY_CARE_PROVIDER_SITE_OTHER): Payer: Medicare Other | Admitting: Emergency Medicine

## 2012-07-10 ENCOUNTER — Encounter: Payer: Self-pay | Admitting: Emergency Medicine

## 2012-07-10 VITALS — BP 148/78 | HR 76 | Temp 97.9°F | Ht 65.5 in | Wt 190.6 lb

## 2012-07-10 DIAGNOSIS — R05 Cough: Secondary | ICD-10-CM

## 2012-07-10 DIAGNOSIS — J449 Chronic obstructive pulmonary disease, unspecified: Secondary | ICD-10-CM

## 2012-07-10 MED ORDER — HYDROCODONE-HOMATROPINE 5-1.5 MG/5ML PO SYRP: 5.0000 mL | ORAL_SOLUTION | Freq: Four times a day (QID) | ORAL | Status: AC | PRN

## 2012-07-10 MED ORDER — HYDROCODONE-HOMATROPINE 5-1.5 MG/5ML PO SYRP: 5.0000 mL | ORAL_SOLUTION | Freq: Four times a day (QID) | ORAL | Status: DC | PRN

## 2012-07-10 NOTE — Assessment & Plan Note (Addendum)
Being exacerbated by PND, possibly also GERD - add chlorpheniramine or brompheniramine to her existing regimen - nexium every day x 1 week, then back to qod - hydromet prn - rov 4

## 2012-07-10 NOTE — Progress Notes (Signed)
66 yobf quit smoking after hospitalization [facility] 2009, mixed dz by PFT, chronic cough.   ROV 07/22/10 -- 67 yo woman. Hx mild COPD, CHF, chronic cough. Presented last time with refractory cough - Dr Sherene Sires held Spiriva, fexofenadine, atenolol. New b-b;locker per cards is coreg. Drainage much improved with chlorphenerimine. Cough is much improved. She doesn't miss the Spiriva. She was started on supplimental O2 during hosp admission last week.   ROV 03/22/11 -- 66 yo woman, mixed disease on PFT, hx CHF, chronic cough. Has been taking OTC decongestants without much relief of congestion, sore throat, cough. Started taking loratadine last week. Has been doing nasal saline washes prn.   ROV 04/20/11 - Acute OV, mixed disease, chronic cough. Last time we treated allergies - NSW, nasal steroid, loratadine for flare of coughing. Still takes Nexium. She reports that her cough eased up a bit for a few days, but then went back to same daily cough, productive of clear mucous. Seems to be worse at night. She has some sinus pain. >>tx w/ claritin and nasal rinses and flonase   ROV 05/05/11 Acute OV  Pt presents for an acute office visit. Complains of persistent cough, congestion and drainage. Cough is keeping her up night.  Using no meds for cough. Mucus is yellow and white. Mucus is very thick.  CT sinus done last ov was neg for acute changes.  Cough and congestion are getting worse.   ROV 05/12/11 -- 65 woman, former tobacco, mixed dz on Spirio, chronic cough with allergic rhinitis. She was seen last week with refractory cough, prod of tan mucous, treated for acute bronchitis and is now improved. She remains on loratadine + flonase + [place]. Also on nexium bid. Not on any inhaled medications. She is preparing to get cataract   ROV 07/11/11 -- COPD/mixed disease, chronic cough and allergies. She has been coughing worse for over 2 weeks. More nasal congestion, more pharyngeal drainage, sore throat. Has been taking her  fluticasone, loratadine, NSW's. She tells me that since last time she has been admitted to hospital for CHF exacerbation. No fevers, no purulent secretions. Still on nexium bid.   ROV 08/23/11 -- COPD, coexisting restriction, chronic cough, allergies. Last time changed fluticasone to astopro, continued NSW + loratadine = nexium. Also rx with azithro x 5 days. Still w PND  ROV 01/09/12 -- COPD, coexisting restriction, chronic cough, allergies. Was just hospitalized for CP and gout. She continues to take Nexium daily, allergy regimen >> NSW, Nasal steroid, loratadine. She uses albuterol very rarely.   Acute OV 05/22/12--  Complains of head congestion, clear nasal drainage, occ prod cough with white/clear mucus x2 weeks . Cough is severe at times. -takes her breath she coughs so hard.  Has a lot of drainage throat.  No fever or chest pain .  No edema.   ROV 07/10/12 -- COPD, coexisting restriction, chronic cough, allergies.  She has been having allergic sx, sneezing, cough, worst when she is supine since July. Her GERD is controlled on Nexium - has gone back to taking every day. She is on her NSW, loratadine, fluticasone. In July took doxy + pred + hydromet. Returns with similar sx.    EXAM :  Filed Vitals:   07/10/12 1441  BP: 148/78  Pulse: 76  Temp: 97.9 F (36.6 C)   Gen: Pleasant, obese, in no distress,  normal affect  ENT: No lesions,  mouth clear,  oropharynx clear, ++clear  postnasal drip  Neck: No JVD, no  TMG, no carotid bruits  Lungs: No use of accessory muscles, no dullness to percussion, coarse BS w/ barking cough  Cardiovascular: RRR, heart sounds normal, no murmur or gallops, no peripheral edema  Musculoskeletal: No deformities, no cyanosis or clubbing  Neuro: alert, non focal  Skin: Warm, no lesions or rashes  COUGH Being exacerbated by PND, possibly also GERD - add chlorpheniramine or brompheniramine to her existing regimen - nexium every day x 1 week, then back to  qod - hydromet prn - rov 4   COPD, MILD Appears to be clinically stable - SABA prn

## 2012-07-10 NOTE — Assessment & Plan Note (Signed)
Appears to be clinically stable - SABA prn

## 2012-07-10 NOTE — Patient Instructions (Addendum)
Please start taking your Nexium every day for the next week Continue your nasal washes, fluticasone nasal spray, loratadine as you are taking them  Use decongestants that contain chlorpheniramine or brompheniramine Use hydromet as directed for cough. Caution that this medication can make you sleepy Follow with Dr Delton Coombes in 4 months or sooner if you have any problems.

## 2012-07-11 ENCOUNTER — Encounter (HOSPITAL_COMMUNITY)
Admission: RE | Admit: 2012-07-11 | Discharge: 2012-07-11 | Disposition: A | Discharge status: Home / Self Care | Payer: Medicare Other | Source: Ambulatory Visit | Attending: Nephrology | Admitting: Nephrology

## 2012-07-11 DIAGNOSIS — N184 Chronic kidney disease, stage 4 (severe): Secondary | ICD-10-CM | POA: Insufficient documentation

## 2012-07-11 DIAGNOSIS — D638 Anemia in other chronic diseases classified elsewhere: Secondary | ICD-10-CM | POA: Insufficient documentation

## 2012-07-11 LAB — POCT HEMOGLOBIN-HEMACUE: Hemoglobin: 12.6 g/dL (ref 12.0–15.0)

## 2012-07-11 LAB — IRON AND TIBC
Saturation Ratios: 33 % (ref 20–55)
UIBC: 191 ug/dL (ref 125–400)

## 2012-07-11 LAB — FERRITIN: Ferritin: 204 ng/mL (ref 10–291)

## 2012-07-11 MED ORDER — DARBEPOETIN ALFA-POLYSORBATE 100 MCG/0.5ML IJ SOLN: 100.0000 ug | INTRAMUSCULAR | Status: DC

## 2012-07-25 ENCOUNTER — Encounter (HOSPITAL_COMMUNITY): Payer: Medicare Other

## 2012-07-26 ENCOUNTER — Other Ambulatory Visit: Payer: Self-pay | Admitting: Emergency Medicine

## 2012-08-03 ENCOUNTER — Encounter (HOSPITAL_COMMUNITY)
Admission: RE | Admit: 2012-08-03 | Discharge: 2012-08-03 | Disposition: A | Discharge status: Home / Self Care | Payer: Medicare Other | Source: Ambulatory Visit | Attending: Nephrology | Admitting: Nephrology

## 2012-08-03 DIAGNOSIS — N184 Chronic kidney disease, stage 4 (severe): Secondary | ICD-10-CM | POA: Insufficient documentation

## 2012-08-03 DIAGNOSIS — D638 Anemia in other chronic diseases classified elsewhere: Secondary | ICD-10-CM | POA: Insufficient documentation

## 2012-08-03 LAB — RENAL FUNCTION PANEL
Albumin: 3.8 g/dL (ref 3.5–5.2)
Calcium: 10.5 mg/dL (ref 8.4–10.5)
GFR calc Af Amer: 12 mL/min — ABNORMAL LOW (ref >90)
GFR calc non Af Amer: 10 mL/min — ABNORMAL LOW (ref >90)
Glucose, Bld: 134 mg/dL — ABNORMAL HIGH (ref 70–99)
Phosphorus: 4 mg/dL (ref 2.3–4.6)
Potassium: 3.2 mEq/L — ABNORMAL LOW (ref 3.5–5.1)
Sodium: 141 mEq/L (ref 135–145)

## 2012-08-03 LAB — POCT HEMOGLOBIN-HEMACUE: Hemoglobin: 12.8 g/dL (ref 12.0–15.0)

## 2012-08-03 MED ORDER — DARBEPOETIN ALFA-POLYSORBATE 100 MCG/0.5ML IJ SOLN: 100.0000 ug | INTRAMUSCULAR | Status: DC

## 2012-08-17 ENCOUNTER — Encounter (HOSPITAL_COMMUNITY)
Admission: RE | Admit: 2012-08-17 | Discharge: 2012-08-17 | Disposition: A | Discharge status: Home / Self Care | Payer: Medicare Other | Source: Ambulatory Visit | Attending: Nephrology | Admitting: Nephrology

## 2012-08-17 LAB — FERRITIN: Ferritin: 247 ng/mL (ref 10–291)

## 2012-08-17 LAB — POCT HEMOGLOBIN-HEMACUE: Hemoglobin: 11.4 g/dL — ABNORMAL LOW (ref 12.0–15.0)

## 2012-08-17 MED ORDER — DARBEPOETIN ALFA-POLYSORBATE 100 MCG/0.5ML IJ SOLN
100.0000 ug | INTRAMUSCULAR | Status: DC
  Administered 2012-08-17: 100 ug via SUBCUTANEOUS

## 2012-08-17 MED ORDER — DARBEPOETIN ALFA-POLYSORBATE 100 MCG/0.5ML IJ SOLN
INTRAMUSCULAR | Status: AC
  Filled 2012-08-17: qty 0.5

## 2012-08-28 ENCOUNTER — Encounter: Payer: Self-pay | Admitting: Adult Health

## 2012-08-28 ENCOUNTER — Ambulatory Visit (INDEPENDENT_AMBULATORY_CARE_PROVIDER_SITE_OTHER): Payer: Medicare Other | Admitting: Adult Health

## 2012-08-28 ENCOUNTER — Ambulatory Visit (INDEPENDENT_AMBULATORY_CARE_PROVIDER_SITE_OTHER)
Admission: RE | Admit: 2012-08-28 | Discharge: 2012-08-28 | Disposition: A | Discharge status: Home / Self Care | Payer: Medicare Other | Source: Ambulatory Visit | Attending: Adult Health | Admitting: Adult Health

## 2012-08-28 VITALS — BP 150/80 | HR 82 | Temp 97.7°F | Ht 67.0 in | Wt 199.4 lb

## 2012-08-28 DIAGNOSIS — J4489 Other specified chronic obstructive pulmonary disease: Secondary | ICD-10-CM

## 2012-08-28 DIAGNOSIS — J449 Chronic obstructive pulmonary disease, unspecified: Secondary | ICD-10-CM

## 2012-08-28 MED ORDER — HYDROCODONE-HOMATROPINE 5-1.5 MG/5ML PO SYRP: 5.0000 mL | ORAL_SOLUTION | Freq: Four times a day (QID) | ORAL | Status: AC | PRN

## 2012-08-28 MED ORDER — PREDNISONE 10 MG PO TABS: ORAL_TABLET | ORAL | Status: DC

## 2012-08-28 MED ORDER — LEVOFLOXACIN 500 MG PO TABS: 500.0000 mg | ORAL_TABLET | Freq: Every day | ORAL | Status: DC

## 2012-08-28 NOTE — Progress Notes (Signed)
66 yobf quit smoking after hospitalization [facility] 2009, mixed dz by PFT, chronic cough.  Chronic renal failure   ROV 07/22/10 -- 66 yo woman. Hx mild COPD, CHF, chronic cough. Presented last time with refractory cough - Dr Sherene Sires held Spiriva, fexofenadine, atenolol. New b-b;locker per cards is coreg. Drainage much improved with chlorphenerimine. Cough is much improved. She doesn't miss the Spiriva. She was started on supplimental O2 during hosp admission last week.   ROV 03/22/11 -- 66 yo woman, mixed disease on PFT, hx CHF, chronic cough. Has been taking OTC decongestants without much relief of congestion, sore throat, cough. Started taking loratadine last week. Has been doing nasal saline washes prn.   ROV 04/20/11 - Acute OV, mixed disease, chronic cough. Last time we treated allergies - NSW, nasal steroid, loratadine for flare of coughing. Still takes Nexium. She reports that her cough eased up a bit for a few days, but then went back to same daily cough, productive of clear mucous. Seems to be worse at night. She has some sinus pain. >>tx w/ claritin and nasal rinses and flonase   ROV 05/05/11 Acute OV  Pt presents for an acute office visit. Complains of persistent cough, congestion and drainage. Cough is keeping her up night.  Using no meds for cough. Mucus is yellow and white. Mucus is very thick.  CT sinus done last ov was neg for acute changes.  Cough and congestion are getting worse.   ROV 05/12/11 -- 65 woman, former tobacco, mixed dz on Spirio, chronic cough with allergic rhinitis. She was seen last week with refractory cough, prod of tan mucous, treated for acute bronchitis and is now improved. She remains on loratadine + flonase + [place]. Also on nexium bid. Not on any inhaled medications. She is preparing to get cataract   ROV 07/11/11 -- COPD/mixed disease, chronic cough and allergies. She has been coughing worse for over 2 weeks. More nasal congestion, more pharyngeal drainage, sore throat. Has  been taking her fluticasone, loratadine, NSW's. She tells me that since last time she has been admitted to hospital for CHF exacerbation. No fevers, no purulent secretions. Still on nexium bid.   ROV 08/23/11 -- COPD, coexisting restriction, chronic cough, allergies. Last time changed fluticasone to astopro, continued NSW + loratadine = nexium. Also rx with azithro x 5 days. Still w PND  ROV 01/09/12 -- COPD, coexisting restriction, chronic cough, allergies. Was just hospitalized for CP and gout. She continues to take Nexium daily, allergy regimen >> NSW, Nasal steroid, loratadine. She uses albuterol very rarely.   Acute OV 05/22/12--  Complains of head congestion, clear nasal drainage, occ prod cough with white/clear mucus x2 weeks . Cough is severe at times. -takes her breath she coughs so hard.  Has a lot of drainage throat.  No fever or chest pain .  No edema.   ROV 07/10/12 -- COPD, coexisting restriction, chronic cough, allergies.  She has been having allergic sx, sneezing, cough, worst when she is supine since July. Her GERD is controlled on Nexium - has gone back to taking every day. She is on her [place], loratadine, fluticasone. In July took doxy + pred + hydromet. Returns with similar sx.    08/28/2012 Acute OV  Complains of prod cough with thick white mucus, upper airway cough, occ SOB - states no better since last ov.  Continues to have cough that rattles all the the time w/ thick mucus.  Mild dyspnea. Some sinus drainage and stuffiness.  Seen 6 weeks ago, cont on cough control measure with GERD /Rhinitis prevention.  Says it is not helping her.  No hemotpysis, abd pain, vomitting, or fever.  CXR today with no acute process.      ROS:  Constitutional:   No  weight loss, night sweats,  Fevers, chills,  +fatigue, or  lassitude.  HEENT:   No headaches,  Difficulty swallowing,  Tooth/dental problems, or  Sore throat,                No sneezing, itching, ear ache,  +nasal congestion,  post nasal drip,   CV:  No chest pain,  Orthopnea, PND, swelling in lower extremities, anasarca, dizziness, palpitations, syncope.   GI  No heartburn, indigestion, abdominal pain, nausea, vomiting, diarrhea, change in bowel habits, loss of appetite, bloody stools.   Resp: No coughing up of blood.    No chest wall deformity  Skin: no rash or lesions.  GU: no dysuria, change in color of urine, no urgency or frequency.  No flank pain, no hematuria   MS:  No joint pain or swelling.  No decreased range of motion.  No back pain.  Psych:  No change in mood or affect. No depression or anxiety.  No memory loss.      EXAM :    Gen: Pleasant, obese, in no distress,  normal affect  ENT: No lesions,  mouth clear,  oropharynx clear, ++clear  postnasal drip  Neck: No JVD, no TMG, no carotid bruits  Lungs: No use of accessory muscles, no dullness to percussion, coarse BS w/ barking cough  Cardiovascular: RRR, heart sounds normal, no murmur or gallops, no peripheral edema  Musculoskeletal: No deformities, no cyanosis or clubbing  Neuro: alert, non focal  Skin: Warm, no lesions or rashes

## 2012-08-28 NOTE — Patient Instructions (Addendum)
Levaquin 500mg  daily for 7 days -take with food.  Prednisone taper over next week. - monitor sugars, call if >200.  Mucinex DM Twice daily  As needed  Cough/congestion  Hydromet 1-2 tsp every 4-6 hr As needed  Cough-may make you sleepy I will call with xray results .  Begin Zyrtec 10mg  in am . Stop clairtin  Begin Chlortrimeton 4mg  2 At bedtime   Continue on Zantac Twice daily   follow up Dr. Delton Coombes  In 4 weeks and As needed   Please contact office for sooner follow up if symptoms do not improve or worsen or seek emergency care

## 2012-08-29 ENCOUNTER — Telehealth: Payer: Self-pay | Admitting: Adult Health

## 2012-08-29 NOTE — Progress Notes (Signed)
Quick Note:  Pt aware of results. ______ 

## 2012-08-29 NOTE — Progress Notes (Signed)
Quick Note:  LMOM TCB x1. ______ 

## 2012-08-29 NOTE — Telephone Encounter (Signed)
Result Note     No changes    Cont w/ ov recs    Please contact office for sooner follow up if symptoms do not improve or worsen or seek emergency care    Pt aware of results. No other questions or concerns at this time.

## 2012-08-31 ENCOUNTER — Encounter (HOSPITAL_COMMUNITY)
Admission: RE | Admit: 2012-08-31 | Discharge: 2012-08-31 | Disposition: A | Discharge status: Home / Self Care | Payer: Medicare Other | Source: Ambulatory Visit | Attending: Nephrology | Admitting: Nephrology

## 2012-08-31 DIAGNOSIS — N184 Chronic kidney disease, stage 4 (severe): Secondary | ICD-10-CM | POA: Insufficient documentation

## 2012-08-31 DIAGNOSIS — D638 Anemia in other chronic diseases classified elsewhere: Secondary | ICD-10-CM | POA: Insufficient documentation

## 2012-08-31 LAB — RENAL FUNCTION PANEL
Albumin: 3.8 g/dL (ref 3.5–5.2)
BUN: 94 mg/dL — ABNORMAL HIGH (ref 6–23)
CO2: 28 meq/L (ref 19–32)
Calcium: 10.2 mg/dL (ref 8.4–10.5)
Chloride: 93 meq/L — ABNORMAL LOW (ref 96–112)
Creatinine, Ser: 5.11 mg/dL — ABNORMAL HIGH (ref 0.50–1.10)
GFR calc Af Amer: 9 mL/min — ABNORMAL LOW
GFR calc non Af Amer: 8 mL/min — ABNORMAL LOW
Glucose, Bld: 116 mg/dL — ABNORMAL HIGH (ref 70–99)
Phosphorus: 5.1 mg/dL — ABNORMAL HIGH (ref 2.3–4.6)
Potassium: 3.5 meq/L (ref 3.5–5.1)
Sodium: 137 meq/L (ref 135–145)

## 2012-08-31 LAB — POCT HEMOGLOBIN-HEMACUE: Hemoglobin: 11.9 g/dL — ABNORMAL LOW (ref 12.0–15.0)

## 2012-08-31 MED ORDER — DARBEPOETIN ALFA-POLYSORBATE 100 MCG/0.5ML IJ SOLN
INTRAMUSCULAR | Status: AC
  Filled 2012-08-31: qty 0.5

## 2012-08-31 MED ORDER — DARBEPOETIN ALFA-POLYSORBATE 100 MCG/0.5ML IJ SOLN
100.0000 ug | INTRAMUSCULAR | Status: DC
  Administered 2012-08-31: 100 ug via SUBCUTANEOUS

## 2012-09-04 NOTE — Assessment & Plan Note (Signed)
Flare with upper airway cough  Manage triggers of cough w/ GERD/AR prevention regimen.  CXR w/ no acute changes.   Plan Levaquin 500mg  daily for 7 days -take with food.  Prednisone taper over next week. - monitor sugars, call if >200.  Mucinex DM Twice daily  As needed  Cough/congestion  Hydromet 1-2 tsp every 4-6 hr As needed  Cough-may make you sleepy  Begin Zyrtec 10mg  in am . Stop clairtin  Begin Chlortrimeton 4mg  2 At bedtime   Continue on Zantac Twice daily   follow up Dr. Delton Coombes  In 4 weeks and As needed   Please contact office for sooner follow up if symptoms do not improve or worsen or seek emergency care

## 2012-09-06 ENCOUNTER — Encounter: Payer: Self-pay | Admitting: Gastroenterology

## 2012-09-13 ENCOUNTER — Other Ambulatory Visit (HOSPITAL_COMMUNITY): Payer: Self-pay | Admitting: *Deleted

## 2012-09-14 ENCOUNTER — Encounter (HOSPITAL_COMMUNITY): Payer: Medicare Other

## 2012-09-21 ENCOUNTER — Encounter (HOSPITAL_COMMUNITY)
Admission: RE | Admit: 2012-09-21 | Discharge: 2012-09-21 | Disposition: A | Discharge status: Home / Self Care | Payer: Medicare Other | Source: Ambulatory Visit | Attending: Nephrology | Admitting: Nephrology

## 2012-09-21 DIAGNOSIS — D638 Anemia in other chronic diseases classified elsewhere: Secondary | ICD-10-CM | POA: Insufficient documentation

## 2012-09-21 DIAGNOSIS — N184 Chronic kidney disease, stage 4 (severe): Secondary | ICD-10-CM | POA: Insufficient documentation

## 2012-09-21 LAB — FERRITIN: Ferritin: 269 ng/mL (ref 10–291)

## 2012-09-21 LAB — IRON AND TIBC
Iron: 113 ug/dL (ref 42–135)
TIBC: 318 ug/dL (ref 250–470)

## 2012-09-21 LAB — POCT HEMOGLOBIN-HEMACUE: Hemoglobin: 12.4 g/dL (ref 12.0–15.0)

## 2012-09-21 MED ORDER — DARBEPOETIN ALFA-POLYSORBATE 100 MCG/0.5ML IJ SOLN: 100.0000 ug | INTRAMUSCULAR | Status: DC

## 2012-09-24 ENCOUNTER — Ambulatory Visit: Payer: Medicare Other | Admitting: Emergency Medicine

## 2012-10-05 ENCOUNTER — Encounter (HOSPITAL_COMMUNITY)
Admission: RE | Admit: 2012-10-05 | Discharge: 2012-10-05 | Disposition: A | Discharge status: Home / Self Care | Payer: Medicare Other | Source: Ambulatory Visit | Attending: Nephrology | Admitting: Nephrology

## 2012-10-05 LAB — RENAL FUNCTION PANEL
BUN: 87 mg/dL — ABNORMAL HIGH (ref 6–23)
Chloride: 95 mEq/L — ABNORMAL LOW (ref 96–112)
Creatinine, Ser: 4.53 mg/dL — ABNORMAL HIGH (ref 0.50–1.10)
Glucose, Bld: 270 mg/dL — ABNORMAL HIGH (ref 70–99)
Phosphorus: 4.8 mg/dL — ABNORMAL HIGH (ref 2.3–4.6)
Potassium: 3.2 mEq/L — ABNORMAL LOW (ref 3.5–5.1)

## 2012-10-05 LAB — POCT HEMOGLOBIN-HEMACUE: Hemoglobin: 11.5 g/dL — ABNORMAL LOW (ref 12.0–15.0)

## 2012-10-05 MED ORDER — DARBEPOETIN ALFA-POLYSORBATE 100 MCG/0.5ML IJ SOLN
100.0000 ug | INTRAMUSCULAR | Status: DC
  Administered 2012-10-05: 100 ug via SUBCUTANEOUS

## 2012-10-05 MED ORDER — DARBEPOETIN ALFA-POLYSORBATE 100 MCG/0.5ML IJ SOLN
INTRAMUSCULAR | Status: AC
  Filled 2012-10-05: qty 0.5

## 2012-10-10 ENCOUNTER — Ambulatory Visit: Payer: Medicare Other | Admitting: Emergency Medicine

## 2012-10-19 ENCOUNTER — Encounter (HOSPITAL_COMMUNITY)
Admission: RE | Admit: 2012-10-19 | Discharge: 2012-10-19 | Disposition: A | Discharge status: Home / Self Care | Payer: Medicare Other | Source: Ambulatory Visit | Attending: Nephrology | Admitting: Nephrology

## 2012-10-19 LAB — POCT HEMOGLOBIN-HEMACUE: Hemoglobin: 11.7 g/dL — ABNORMAL LOW (ref 12.0–15.0)

## 2012-10-19 MED ORDER — DARBEPOETIN ALFA-POLYSORBATE 100 MCG/0.5ML IJ SOLN
INTRAMUSCULAR | Status: AC
  Administered 2012-10-19: 100 ug via SUBCUTANEOUS
  Filled 2012-10-19: qty 0.5

## 2012-10-19 MED ORDER — DARBEPOETIN ALFA-POLYSORBATE 100 MCG/0.5ML IJ SOLN
100.0000 ug | INTRAMUSCULAR | Status: DC
  Administered 2012-10-19: 100 ug via SUBCUTANEOUS

## 2012-10-27 ENCOUNTER — Other Ambulatory Visit: Payer: Self-pay | Admitting: Cardiology

## 2012-11-02 ENCOUNTER — Encounter (HOSPITAL_COMMUNITY)
Admission: RE | Admit: 2012-11-02 | Discharge: 2012-11-02 | Disposition: A | Discharge status: Home / Self Care | Payer: Medicare Other | Source: Ambulatory Visit | Attending: Nephrology | Admitting: Nephrology

## 2012-11-02 DIAGNOSIS — D638 Anemia in other chronic diseases classified elsewhere: Secondary | ICD-10-CM | POA: Insufficient documentation

## 2012-11-02 DIAGNOSIS — N184 Chronic kidney disease, stage 4 (severe): Secondary | ICD-10-CM | POA: Insufficient documentation

## 2012-11-02 LAB — RENAL FUNCTION PANEL
Albumin: 3.3 g/dL — ABNORMAL LOW (ref 3.5–5.2)
CO2: 30 mEq/L (ref 19–32)
Chloride: 96 mEq/L (ref 96–112)
GFR calc non Af Amer: 9 mL/min — ABNORMAL LOW (ref >90)
Potassium: 3.3 mEq/L — ABNORMAL LOW (ref 3.5–5.1)

## 2012-11-02 LAB — IRON AND TIBC
Iron: 73 ug/dL (ref 42–135)
Saturation Ratios: 28 % (ref 20–55)
TIBC: 258 ug/dL (ref 250–470)

## 2012-11-02 MED ORDER — DARBEPOETIN ALFA-POLYSORBATE 100 MCG/0.5ML IJ SOLN: 100.0000 ug | INTRAMUSCULAR | Status: DC

## 2012-11-05 LAB — POCT HEMOGLOBIN-HEMACUE: Hemoglobin: 12.2 g/dL (ref 12.0–15.0)

## 2012-11-06 ENCOUNTER — Encounter: Payer: Self-pay | Admitting: Cardiology

## 2012-11-16 ENCOUNTER — Encounter (HOSPITAL_COMMUNITY)
Admission: RE | Admit: 2012-11-16 | Discharge: 2012-11-16 | Disposition: A | Discharge status: Home / Self Care | Payer: Medicare Other | Source: Ambulatory Visit | Attending: Nephrology | Admitting: Nephrology

## 2012-11-16 MED ORDER — DARBEPOETIN ALFA-POLYSORBATE 100 MCG/0.5ML IJ SOLN
INTRAMUSCULAR | Status: AC
  Administered 2012-11-16: 100 ug
  Filled 2012-11-16: qty 0.5

## 2012-11-16 MED ORDER — DARBEPOETIN ALFA-POLYSORBATE 100 MCG/0.5ML IJ SOLN: 100.0000 ug | INTRAMUSCULAR | Status: DC

## 2012-11-30 ENCOUNTER — Encounter (HOSPITAL_COMMUNITY)
Admission: RE | Admit: 2012-11-30 | Discharge: 2012-11-30 | Disposition: A | Discharge status: Home / Self Care | Payer: Medicare Other | Source: Ambulatory Visit | Attending: Nephrology | Admitting: Nephrology

## 2012-11-30 DIAGNOSIS — D638 Anemia in other chronic diseases classified elsewhere: Secondary | ICD-10-CM | POA: Insufficient documentation

## 2012-11-30 DIAGNOSIS — N184 Chronic kidney disease, stage 4 (severe): Secondary | ICD-10-CM | POA: Insufficient documentation

## 2012-11-30 LAB — RENAL FUNCTION PANEL
Albumin: 3.5 g/dL (ref 3.5–5.2)
BUN: 67 mg/dL — ABNORMAL HIGH (ref 6–23)
Creatinine, Ser: 3.95 mg/dL — ABNORMAL HIGH (ref 0.50–1.10)
Phosphorus: 4.2 mg/dL (ref 2.3–4.6)

## 2012-11-30 LAB — FERRITIN: Ferritin: 177 ng/mL (ref 10–291)

## 2012-11-30 LAB — POCT HEMOGLOBIN-HEMACUE: Hemoglobin: 12.7 g/dL (ref 12.0–15.0)

## 2012-11-30 MED ORDER — DARBEPOETIN ALFA-POLYSORBATE 100 MCG/0.5ML IJ SOLN: 100.0000 ug | INTRAMUSCULAR | Status: DC

## 2012-12-05 IMAGING — CR DG CHEST 2V
2 series · 2 of 2 positions shown · non-contrast
Comparison: [DATE] and 12/09/2011

CLINICAL DATA: Right-sided chest pain.

CHEST - 2 VIEW

[w chest pa]
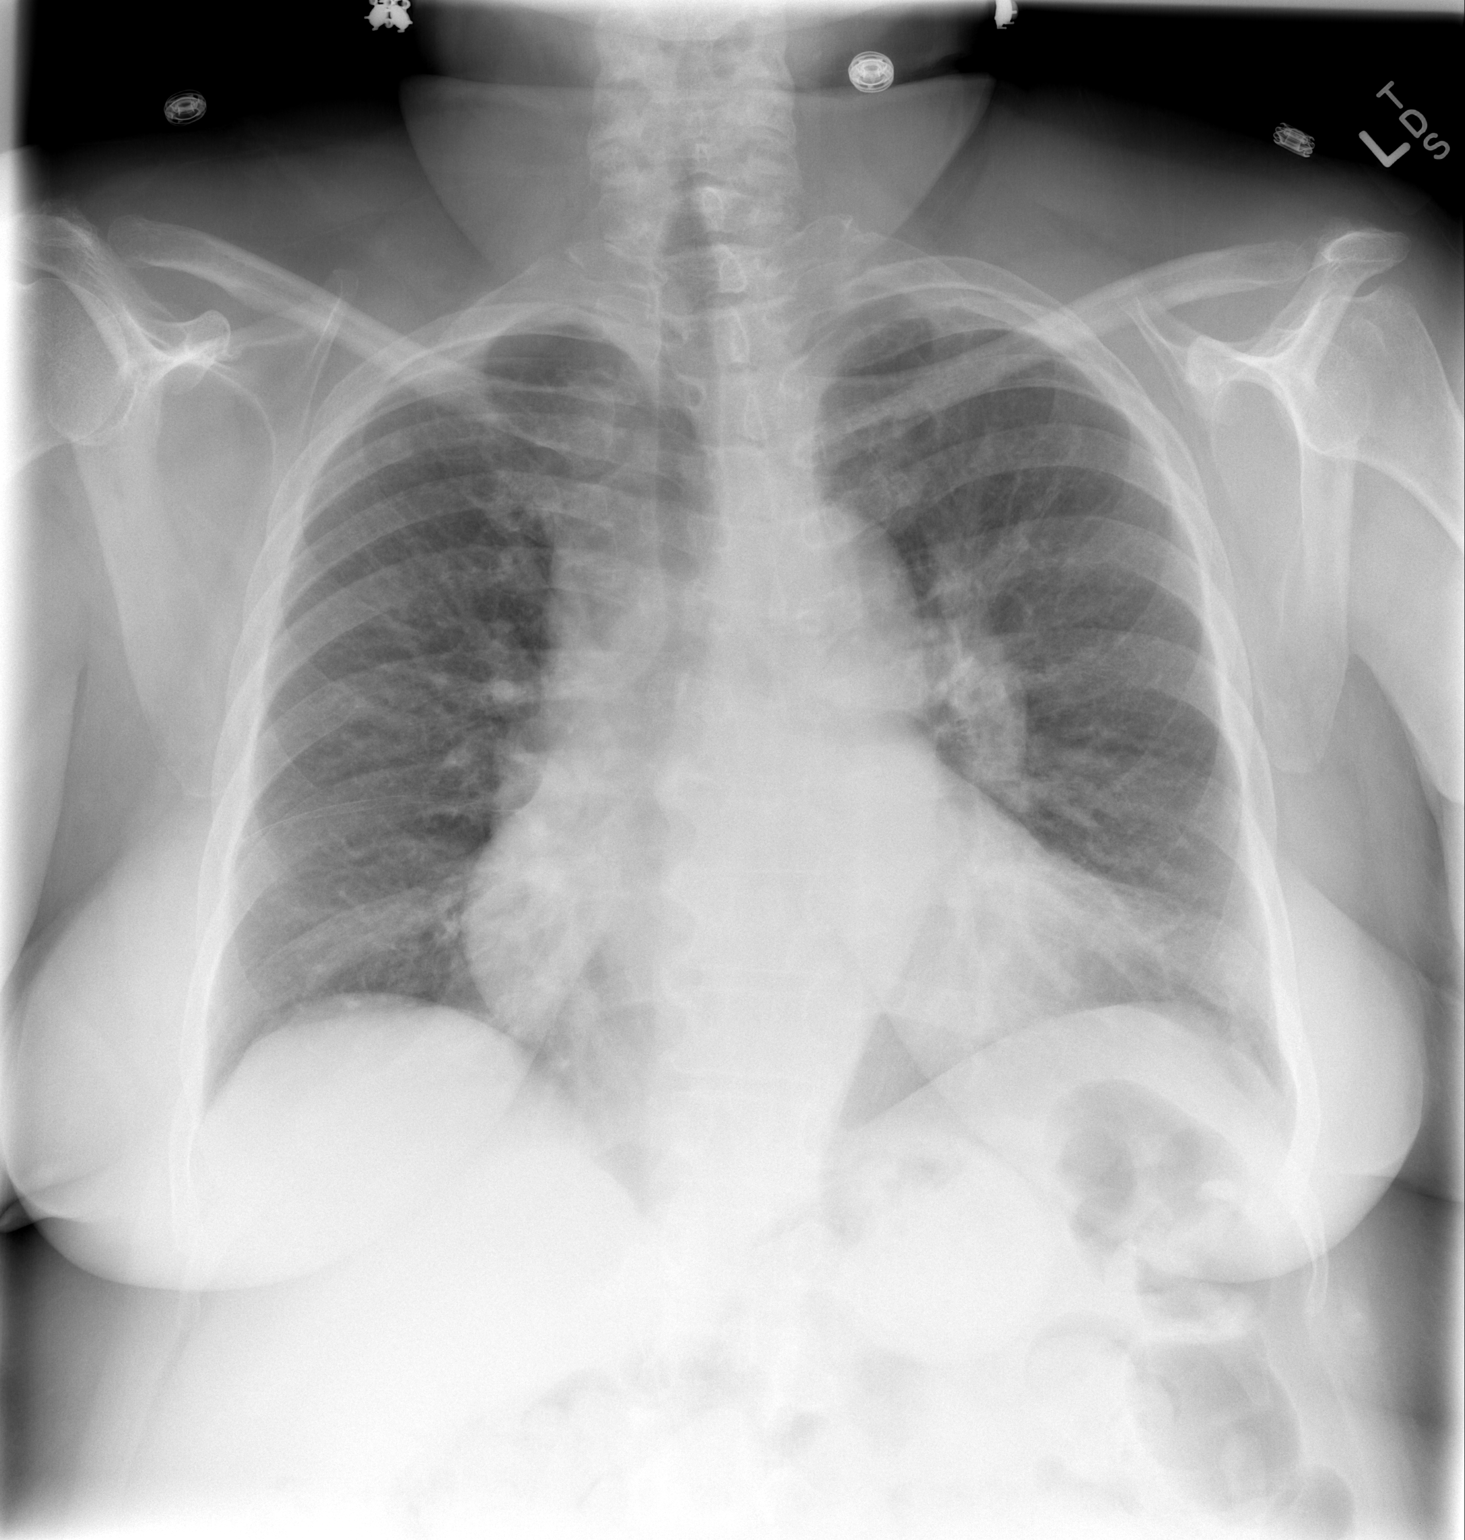

[w chest lat]
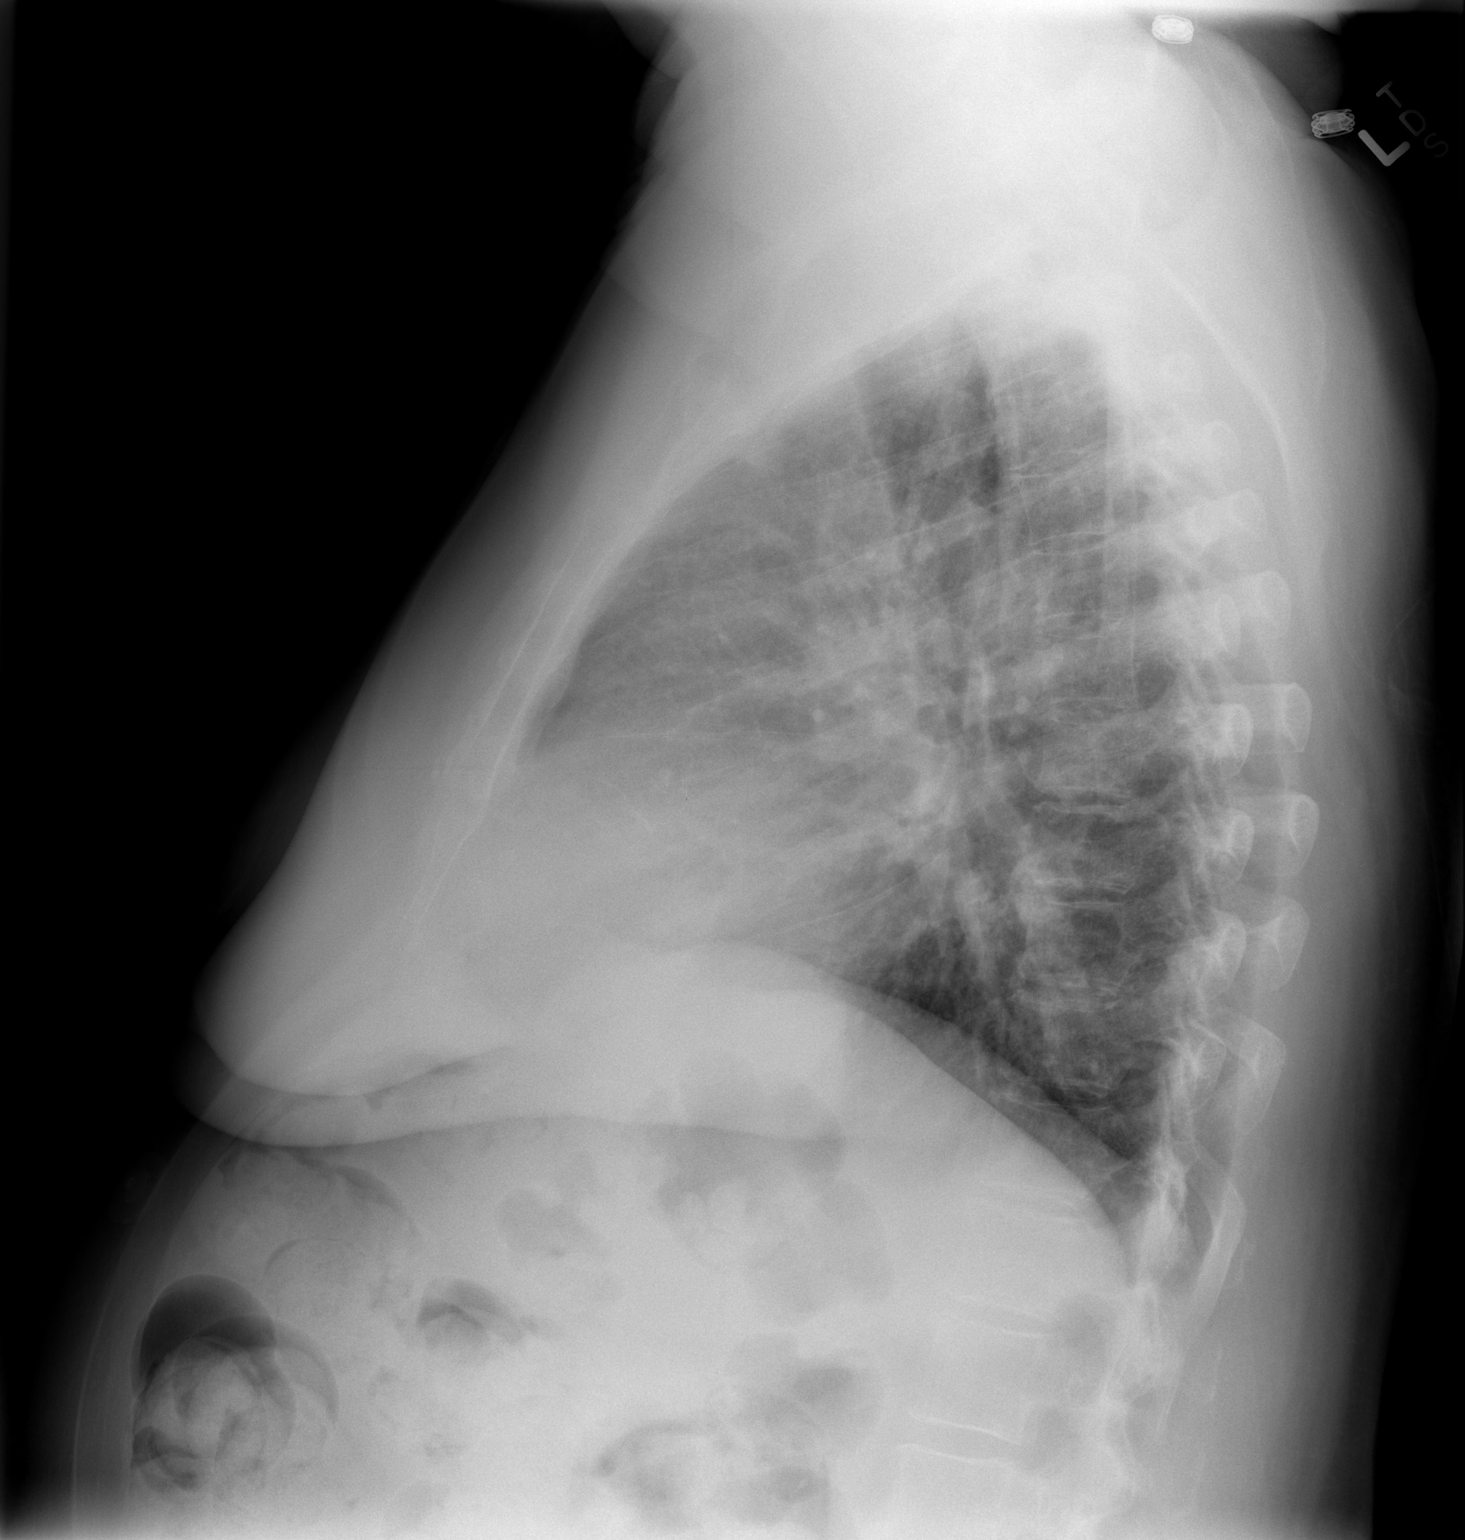

[2 of 2 positions shown; findings below may reference images not displayed]

FINDINGS: The patient has chronic cardiomegaly with mild pulmonary
vascular prominence without infiltrates or pulmonary edema.  No
acute osseous abnormality.
IMPRESSION: No acute abnormalities.  Chronic cardiomegaly with chronic mild
pulmonary vascular prominence.

## 2012-12-13 ENCOUNTER — Other Ambulatory Visit (HOSPITAL_COMMUNITY): Payer: Self-pay | Admitting: *Deleted

## 2012-12-14 ENCOUNTER — Encounter (HOSPITAL_COMMUNITY): Payer: Medicare Other

## 2013-01-14 ENCOUNTER — Encounter (HOSPITAL_COMMUNITY)
Admission: RE | Admit: 2013-01-14 | Discharge: 2013-01-14 | Disposition: A | Discharge status: Home / Self Care | Payer: Medicare Other | Source: Ambulatory Visit | Attending: Nephrology | Admitting: Nephrology

## 2013-01-14 DIAGNOSIS — D638 Anemia in other chronic diseases classified elsewhere: Secondary | ICD-10-CM | POA: Insufficient documentation

## 2013-01-14 DIAGNOSIS — N184 Chronic kidney disease, stage 4 (severe): Secondary | ICD-10-CM | POA: Insufficient documentation

## 2013-01-14 LAB — RENAL FUNCTION PANEL
Albumin: 3.4 g/dL — ABNORMAL LOW (ref 3.5–5.2)
Chloride: 100 mEq/L (ref 96–112)
GFR calc non Af Amer: 11 mL/min — ABNORMAL LOW (ref >90)
Potassium: 3.8 mEq/L (ref 3.5–5.1)
Sodium: 139 mEq/L (ref 135–145)

## 2013-01-14 LAB — IRON AND TIBC
Iron: 79 ug/dL (ref 42–135)
TIBC: 276 ug/dL (ref 250–470)

## 2013-01-14 LAB — POCT HEMOGLOBIN-HEMACUE: Hemoglobin: 11.8 g/dL — ABNORMAL LOW (ref 12.0–15.0)

## 2013-01-14 MED ORDER — DARBEPOETIN ALFA-POLYSORBATE 100 MCG/0.5ML IJ SOLN: 100.0000 ug | INTRAMUSCULAR | Status: DC

## 2013-01-14 MED ORDER — DARBEPOETIN ALFA-POLYSORBATE 100 MCG/0.5ML IJ SOLN
INTRAMUSCULAR | Status: AC
  Administered 2013-01-14: 100 ug via SUBCUTANEOUS
  Filled 2013-01-14: qty 0.5

## 2013-01-26 ENCOUNTER — Other Ambulatory Visit: Payer: Self-pay | Admitting: Cardiology

## 2013-01-28 ENCOUNTER — Encounter (HOSPITAL_COMMUNITY)
Admission: RE | Admit: 2013-01-28 | Discharge: 2013-01-28 | Disposition: A | Discharge status: Home / Self Care | Payer: Medicare Other | Source: Ambulatory Visit | Attending: Nephrology | Admitting: Nephrology

## 2013-01-28 DIAGNOSIS — N184 Chronic kidney disease, stage 4 (severe): Secondary | ICD-10-CM | POA: Insufficient documentation

## 2013-01-28 DIAGNOSIS — D638 Anemia in other chronic diseases classified elsewhere: Secondary | ICD-10-CM | POA: Insufficient documentation

## 2013-01-28 LAB — POCT HEMOGLOBIN-HEMACUE: Hemoglobin: 12 g/dL (ref 12.0–15.0)

## 2013-01-28 MED ORDER — DARBEPOETIN ALFA-POLYSORBATE 100 MCG/0.5ML IJ SOLN: 100.0000 ug | INTRAMUSCULAR | Status: DC

## 2013-02-08 ENCOUNTER — Encounter: Payer: Self-pay | Admitting: Internal Medicine

## 2013-02-08 ENCOUNTER — Ambulatory Visit (INDEPENDENT_AMBULATORY_CARE_PROVIDER_SITE_OTHER): Payer: Medicare Other | Admitting: Internal Medicine

## 2013-02-08 VITALS — BP 132/72 | HR 72 | Temp 98.4°F | Ht 67.5 in | Wt 203.0 lb

## 2013-02-08 DIAGNOSIS — R05 Cough: Secondary | ICD-10-CM

## 2013-02-08 MED ORDER — HYDROCODONE-HOMATROPINE 5-1.5 MG/5ML PO SYRP: 5.0000 mL | ORAL_SOLUTION | Freq: Four times a day (QID) | ORAL | Status: DC | PRN

## 2013-02-08 MED ORDER — PREDNISONE (PAK) 10 MG PO TABS: ORAL_TABLET | ORAL | Status: DC

## 2013-02-08 MED ORDER — RANITIDINE HCL 300 MG PO TABS: ORAL_TABLET | ORAL | Status: AC

## 2013-02-08 NOTE — Progress Notes (Signed)
66 yobf quit smoking after hospitalization [facility] 2009, mixed dz by PFT, chronic cough.  Chronic renal failure   ROV 07/22/10 -- 67 yo woman. Hx mild COPD, CHF, chronic cough. Presented last time with refractory cough - Dr Sherene Sires held Spiriva, fexofenadine, atenolol. New b-b;locker per cards is coreg. Drainage much improved with chlorphenerimine. Cough is much improved. She doesn't miss the Spiriva. She was started on supplimental O2 during hosp admission last week.   ROV 03/22/11 -- 67 yo woman, mixed disease on PFT, hx CHF, chronic cough. Has been taking OTC decongestants without much relief of congestion, sore throat, cough. Started taking loratadine last week. Has been doing nasal saline washes prn.   ROV 04/20/11 - Acute OV, mixed disease, chronic cough. Last time we treated allergies - NSW, nasal steroid, loratadine for flare of coughing. Still takes Nexium. She reports that her cough eased up a bit for a few days, but then went back to same daily cough, productive of clear mucous. Seems to be worse at night. She has some sinus pain. >>tx w/ claritin and nasal rinses and flonase   ROV 05/05/11 Acute OV  Pt presents for an acute office visit. Complains of persistent cough, congestion and drainage. Cough is keeping her up night.  Using no meds for cough. Mucus is yellow and white. Mucus is very thick.  CT sinus done last ov was neg for acute changes.  Cough and congestion are getting worse.   ROV 05/12/11 -- 67 woman, former tobacco, mixed dz on Spirio, chronic cough with allergic rhinitis. She was seen last week with refractory cough, prod of tan mucous, treated for acute bronchitis and is now improved. She remains on loratadine + flonase + [place]. Also on nexium bid. Not on any inhaled medications. She is preparing to get cataract   ROV 07/11/11 -- COPD/mixed disease, chronic cough and allergies. She has been coughing worse for over 2 weeks. More nasal congestion, more pharyngeal drainage, sore throat. Has  been taking her fluticasone, loratadine, NSW's. She tells me that since last time she has been admitted to hospital for CHF exacerbation. No fevers, no purulent secretions. Still on nexium bid.   ROV 08/23/11 -- COPD, coexisting restriction, chronic cough, allergies. Last time changed fluticasone to astopro, continued [place] + loratadine = nexium. Also rx with azithro x 5 days. Still w PND  ROV 01/09/12 -- COPD, coexisting restriction, chronic cough, allergies. Was just hospitalized for CP and gout. She continues to take Nexium daily, allergy regimen >> [place], Nasal steroid, loratadine. She uses albuterol very rarely.   Acute OV 05/22/12--  Complains of head congestion, clear nasal drainage, occ prod cough with white/clear mucus x2 weeks . Cough is severe at times. -takes her breath she coughs so hard.  Has a lot of drainage throat.  No fever or chest pain .  No edema.   ROV 07/10/12 -- COPD, coexisting restriction, chronic cough, allergies.  She has been having allergic sx, sneezing, cough, worst when she is supine since July. Her GERD is controlled on Nexium - has gone back to taking every day. She is on her [place], loratadine, fluticasone. In July took doxy + pred + hydromet. Returns with similar sx.    08/28/2012 Acute OV  Complains of prod cough with thick white mucus, upper airway cough, occ SOB - states no better since last ov.  Continues to have cough that rattles all the the time w/ thick mucus.  Mild dyspnea. Some sinus drainage and stuffiness.  Seen 6 weeks ago, cont on cough control measure with GERD /Rhinitis prevention.  Says it is not helping her.  No hemotpysis, abd pain, vomitting, or fever.  CXR today with no acute process.  rec Levaquin 500mg  daily for 7 days -take with food.  Prednisone taper over next week. - monitor sugars, call if >200.  Mucinex DM Twice daily  As needed  Cough/congestion  Hydromet 1-2 tsp every 4-6 hr As needed  Cough-may make you sleepy I will call with xray  results .  Begin Zyrtec 10mg  in am . Stop clairtin  Begin Chlortrimeton 4mg  2 At bedtime   Continue on Zantac Twice daily     02/08/2013 acute ov cough back x 2 weeks on prevacid 15 bid and added mucinex and 1st h1 but continued cough worst at hs, dry, occ albuterol does help but most of time not.    No obvious daytime variabilty or assoc   cp or chest tightness, subjective wheeze overt sinus or hb symptoms. No unusual exp hx or h/o childhood pna/ asthma or premature birth to her knowledge.   Once asleep does ok without  am exacerbation  of respiratory  c/o's or need for noct saba. Also denies any obvious fluctuation of symptoms with weather or environmental changes or other aggravating or alleviating factors except as outlined above   ROS  The following are not active complaints unless bolded sore throat, dysphagia, dental problems, itching, sneezing,  nasal congestion or excess/ purulent secretions, ear ache,   fever, chills, sweats, unintended wt loss, pleuritic or exertional cp, hemoptysis,  orthopnea pnd or leg swelling, presyncope, palpitations, heartburn, abdominal pain, anorexia, nausea, vomiting, diarrhea  or change in bowel or urinary habits, change in stools or urine, dysuria,hematuria,  rash, arthralgias, visual complaints, headache, numbness weakness or ataxia or problems with walking or coordination,  change in mood/affect or memory.            EXAM :    Gen: Pleasant, obese, in no distress,  normal affect  ENT: No lesions,  mouth clear,  oropharynx clear, ++clear  postnasal drip  Neck: No JVD, no TMG, no carotid bruits  Lungs: No use of accessory muscles, no dullness to percussion, coarse BS w/ barking cough  Cardiovascular: RRR, heart sounds normal, no murmur or gallops, no peripheral edema  Musculoskeletal: No deformities, no cyanosis or clubbing  Neuro: alert, non focal  Skin: Warm, no lesions or rashes     cxr 08/29/12 Cardiac enlargement with pulmonary  vascular congestion.

## 2013-02-08 NOTE — Patient Instructions (Addendum)
Prednisone taper over next week. - monitor sugars, call if >200.  Mucinex DM  1200 mg Twice daily  As needed  Cough/congestion  Hydromet 1-2 tsp every 4-6 hr As needed  Cough-may make you sleepy Begin Chlortrimeton 4mg  2 At bedtime  In place zyrtec  Continue on Zantac 300 mg  at bedtime    GERD (REFLUX)  is an extremely common cause of respiratory symptoms, many times with no significant heartburn at all.    It can be treated with medication, but also with lifestyle changes including avoidance of late meals, excessive alcohol, smoking cessation, and avoid fatty foods, chocolate, peppermint, colas, red wine, and acidic juices such as orange juice.  NO MINT OR MENTHOL PRODUCTS SO NO COUGH DROPS  USE SUGARLESS CANDY INSTEAD (jolley ranchers or Stover's)  NO OIL BASED VITAMINS - use powdered substitutes.    See Dr Delton Coombes in 2 weeks if not better

## 2013-02-09 NOTE — Assessment & Plan Note (Signed)
The most common causes of chronic cough in immunocompetent adults include the following: upper airway cough syndrome (UACS), previously referred to as postnasal drip syndrome (PNDS), which is caused by variety of rhinosinus conditions; (2) asthma; (3) GERD; (4) chronic bronchitis from cigarette smoking or other inhaled environmental irritants; (5) nonasthmatic eosinophilic bronchitis; and (6) bronchiectasis.   These conditions, singly or in combination, have accounted for up to 94% of the causes of chronic cough in prospective studies.   Other conditions have constituted no >6% of the causes in prospective studies These have included bronchogenic carcinoma, chronic interstitial pneumonia, sarcoidosis, left ventricular failure, ACEI-induced cough, and aspiration from a condition associated with pharyngeal dysfunction.    Chronic cough is often simultaneously caused by more than one condition. A single cause has been found from 38 to 82% of the time, multiple causes from 18 to 62%. Multiply caused cough has been the result of three diseases up to 42% of the time.       This is most likely  Classic Upper airway cough syndrome, so named because it's frequently impossible to sort out how much is  CR/sinusitis with freq throat clearing (which can be related to primary GERD)   vs  causing  secondary (" extra esophageal")  GERD from wide swings in gastric pressure that occur with throat clearing, often  promoting self use of mint and menthol lozenges that reduce the lower esophageal sphincter tone and exacerbate the problem further in a cyclical fashion.   These are the same pts (now being labeled as having "irritable larynx syndrome" by some cough centers) who not infrequently have a history of having failed to tolerate ace inhibitors,  dry powder inhalers or biphosphonates or report having atypical reflux symptoms that don't respond to standard doses of PPI , and are easily confused as having aecopd or asthma  flares by even experienced allergists/ pulmonologists.   See instructions for specific recommendations which were reviewed directly with the patient who was given a copy with highlighter outlining the key components.

## 2013-02-11 ENCOUNTER — Encounter (HOSPITAL_COMMUNITY)
Admission: RE | Admit: 2013-02-11 | Discharge: 2013-02-11 | Disposition: A | Discharge status: Home / Self Care | Payer: Medicare Other | Source: Ambulatory Visit | Attending: Nephrology | Admitting: Nephrology

## 2013-02-11 LAB — RENAL FUNCTION PANEL
Albumin: 3.7 g/dL (ref 3.5–5.2)
CO2: 28 mEq/L (ref 19–32)
Calcium: 9.7 mg/dL (ref 8.4–10.5)
Chloride: 96 mEq/L (ref 96–112)
GFR calc Af Amer: 11 mL/min — ABNORMAL LOW (ref >90)
GFR calc non Af Amer: 9 mL/min — ABNORMAL LOW (ref >90)
Sodium: 137 mEq/L (ref 135–145)

## 2013-02-11 LAB — IRON AND TIBC: Saturation Ratios: 32 % (ref 20–55)

## 2013-02-11 MED ORDER — DARBEPOETIN ALFA-POLYSORBATE 100 MCG/0.5ML IJ SOLN
100.0000 ug | INTRAMUSCULAR | Status: DC
  Administered 2013-02-11: 100 ug via SUBCUTANEOUS

## 2013-02-11 MED ORDER — DARBEPOETIN ALFA-POLYSORBATE 100 MCG/0.5ML IJ SOLN
INTRAMUSCULAR | Status: AC
  Filled 2013-02-11: qty 0.5

## 2013-02-25 ENCOUNTER — Encounter (HOSPITAL_COMMUNITY)
Admission: RE | Admit: 2013-02-25 | Discharge: 2013-02-25 | Disposition: A | Discharge status: Home / Self Care | Payer: Medicare Other | Source: Ambulatory Visit | Attending: Nephrology | Admitting: Nephrology

## 2013-02-25 DIAGNOSIS — D638 Anemia in other chronic diseases classified elsewhere: Secondary | ICD-10-CM | POA: Insufficient documentation

## 2013-02-25 DIAGNOSIS — N184 Chronic kidney disease, stage 4 (severe): Secondary | ICD-10-CM | POA: Insufficient documentation

## 2013-02-25 LAB — POCT HEMOGLOBIN-HEMACUE: Hemoglobin: 13.2 g/dL (ref 12.0–15.0)

## 2013-02-25 MED ORDER — DARBEPOETIN ALFA-POLYSORBATE 100 MCG/0.5ML IJ SOLN: 100.0000 ug | INTRAMUSCULAR | Status: DC

## 2013-03-06 ENCOUNTER — Ambulatory Visit: Payer: Medicare Other | Admitting: Emergency Medicine

## 2013-03-08 ENCOUNTER — Other Ambulatory Visit (HOSPITAL_COMMUNITY): Payer: Self-pay

## 2013-03-11 ENCOUNTER — Encounter (HOSPITAL_COMMUNITY)
Admission: RE | Admit: 2013-03-11 | Discharge: 2013-03-11 | Disposition: A | Discharge status: Home / Self Care | Payer: Medicare Other | Source: Ambulatory Visit | Attending: Nephrology | Admitting: Nephrology

## 2013-03-11 LAB — IRON AND TIBC
Saturation Ratios: 28 % (ref 20–55)
TIBC: 271 ug/dL (ref 250–470)
UIBC: 195 ug/dL (ref 125–400)

## 2013-03-11 LAB — RENAL FUNCTION PANEL
BUN: 78 mg/dL — ABNORMAL HIGH (ref 6–23)
CO2: 26 mEq/L (ref 19–32)
Calcium: 9.9 mg/dL (ref 8.4–10.5)
Chloride: 99 mEq/L (ref 96–112)
Creatinine, Ser: 4.55 mg/dL — ABNORMAL HIGH (ref 0.50–1.10)
GFR calc non Af Amer: 9 mL/min — ABNORMAL LOW (ref >90)
Glucose, Bld: 132 mg/dL — ABNORMAL HIGH (ref 70–99)

## 2013-03-11 LAB — POCT HEMOGLOBIN-HEMACUE: Hemoglobin: 11.6 g/dL — ABNORMAL LOW (ref 12.0–15.0)

## 2013-03-11 MED ORDER — DARBEPOETIN ALFA-POLYSORBATE 100 MCG/0.5ML IJ SOLN
INTRAMUSCULAR | Status: AC
  Filled 2013-03-11: qty 0.5

## 2013-03-11 MED ORDER — DARBEPOETIN ALFA-POLYSORBATE 100 MCG/0.5ML IJ SOLN
100.0000 ug | INTRAMUSCULAR | Status: DC
  Administered 2013-03-11: 100 ug via SUBCUTANEOUS

## 2013-03-25 ENCOUNTER — Encounter (HOSPITAL_COMMUNITY)
Admission: RE | Admit: 2013-03-25 | Discharge: 2013-03-25 | Disposition: A | Discharge status: Home / Self Care | Payer: Medicare Other | Source: Ambulatory Visit | Attending: Nephrology | Admitting: Nephrology

## 2013-03-25 DIAGNOSIS — N184 Chronic kidney disease, stage 4 (severe): Secondary | ICD-10-CM | POA: Insufficient documentation

## 2013-03-25 DIAGNOSIS — D638 Anemia in other chronic diseases classified elsewhere: Secondary | ICD-10-CM | POA: Insufficient documentation

## 2013-03-25 MED ORDER — DARBEPOETIN ALFA-POLYSORBATE 100 MCG/0.5ML IJ SOLN: 100.0000 ug | INTRAMUSCULAR | Status: DC

## 2013-03-26 LAB — POCT HEMOGLOBIN-HEMACUE: Hemoglobin: 12.9 g/dL (ref 12.0–15.0)

## 2013-04-08 ENCOUNTER — Encounter (HOSPITAL_COMMUNITY)
Admission: RE | Admit: 2013-04-08 | Discharge: 2013-04-08 | Disposition: A | Discharge status: Home / Self Care | Payer: Medicare Other | Source: Ambulatory Visit | Attending: Nephrology | Admitting: Nephrology

## 2013-04-08 LAB — POCT HEMOGLOBIN-HEMACUE: Hemoglobin: 11.9 g/dL — ABNORMAL LOW (ref 12.0–15.0)

## 2013-04-08 LAB — RENAL FUNCTION PANEL
Albumin: 3.1 g/dL — ABNORMAL LOW (ref 3.5–5.2)
BUN: 81 mg/dL — ABNORMAL HIGH (ref 6–23)
CO2: 25 mEq/L (ref 19–32)
Calcium: 9.9 mg/dL (ref 8.4–10.5)
Chloride: 95 mEq/L — ABNORMAL LOW (ref 96–112)
Creatinine, Ser: 4.34 mg/dL — ABNORMAL HIGH (ref 0.50–1.10)

## 2013-04-08 LAB — IRON AND TIBC
Saturation Ratios: 23 % (ref 20–55)
TIBC: 240 ug/dL — ABNORMAL LOW (ref 250–470)

## 2013-04-08 LAB — FERRITIN: Ferritin: 372 ng/mL — ABNORMAL HIGH (ref 10–291)

## 2013-04-08 MED ORDER — DARBEPOETIN ALFA-POLYSORBATE 100 MCG/0.5ML IJ SOLN
INTRAMUSCULAR | Status: AC
  Filled 2013-04-08: qty 0.5

## 2013-04-08 MED ORDER — DARBEPOETIN ALFA-POLYSORBATE 100 MCG/0.5ML IJ SOLN
100.0000 ug | INTRAMUSCULAR | Status: DC
  Administered 2013-04-08: 100 ug via SUBCUTANEOUS

## 2013-04-22 ENCOUNTER — Encounter (HOSPITAL_COMMUNITY)
Admission: RE | Admit: 2013-04-22 | Discharge: 2013-04-22 | Disposition: A | Discharge status: Home / Self Care | Payer: Medicare Other | Source: Ambulatory Visit | Attending: Nephrology | Admitting: Nephrology

## 2013-04-22 DIAGNOSIS — D638 Anemia in other chronic diseases classified elsewhere: Secondary | ICD-10-CM | POA: Insufficient documentation

## 2013-04-22 DIAGNOSIS — N184 Chronic kidney disease, stage 4 (severe): Secondary | ICD-10-CM | POA: Insufficient documentation

## 2013-04-22 LAB — POCT HEMOGLOBIN-HEMACUE: Hemoglobin: 11.2 g/dL — ABNORMAL LOW (ref 12.0–15.0)

## 2013-04-22 MED ORDER — DARBEPOETIN ALFA-POLYSORBATE 100 MCG/0.5ML IJ SOLN
100.0000 ug | INTRAMUSCULAR | Status: DC
  Administered 2013-04-22: 100 ug via SUBCUTANEOUS

## 2013-04-22 MED ORDER — DARBEPOETIN ALFA-POLYSORBATE 100 MCG/0.5ML IJ SOLN
INTRAMUSCULAR | Status: AC
  Filled 2013-04-22: qty 0.5

## 2013-05-06 ENCOUNTER — Encounter (HOSPITAL_COMMUNITY)
Admission: RE | Admit: 2013-05-06 | Discharge: 2013-05-06 | Disposition: A | Discharge status: Home / Self Care | Payer: Medicare Other | Source: Ambulatory Visit | Attending: Nephrology | Admitting: Nephrology

## 2013-05-06 LAB — IRON AND TIBC
Iron: 54 ug/dL (ref 42–135)
Saturation Ratios: 21 % (ref 20–55)
TIBC: 257 ug/dL (ref 250–470)

## 2013-05-06 LAB — RENAL FUNCTION PANEL
CO2: 26 mEq/L (ref 19–32)
Chloride: 95 mEq/L — ABNORMAL LOW (ref 96–112)
Creatinine, Ser: 4.08 mg/dL — ABNORMAL HIGH (ref 0.50–1.10)
GFR calc Af Amer: 12 mL/min — ABNORMAL LOW (ref >90)
GFR calc non Af Amer: 10 mL/min — ABNORMAL LOW (ref >90)
Potassium: 3.4 mEq/L — ABNORMAL LOW (ref 3.5–5.1)
Sodium: 136 mEq/L (ref 135–145)

## 2013-05-06 MED ORDER — DARBEPOETIN ALFA-POLYSORBATE 100 MCG/0.5ML IJ SOLN
100.0000 ug | INTRAMUSCULAR | Status: DC
  Administered 2013-05-06: 100 ug via SUBCUTANEOUS

## 2013-05-06 MED ORDER — DARBEPOETIN ALFA-POLYSORBATE 100 MCG/0.5ML IJ SOLN
INTRAMUSCULAR | Status: AC
  Filled 2013-05-06: qty 0.5

## 2013-05-20 ENCOUNTER — Encounter (HOSPITAL_COMMUNITY)
Admission: RE | Admit: 2013-05-20 | Discharge: 2013-05-20 | Disposition: A | Discharge status: Home / Self Care | Payer: Medicare Other | Source: Ambulatory Visit | Attending: Nephrology | Admitting: Nephrology

## 2013-05-20 ENCOUNTER — Encounter (HOSPITAL_COMMUNITY): Payer: Self-pay

## 2013-05-20 MED ORDER — DARBEPOETIN ALFA-POLYSORBATE 100 MCG/0.5ML IJ SOLN
INTRAMUSCULAR | Status: AC
  Filled 2013-05-20: qty 0.5

## 2013-05-20 MED ORDER — DARBEPOETIN ALFA-POLYSORBATE 100 MCG/0.5ML IJ SOLN
100.0000 ug | INTRAMUSCULAR | Status: DC
  Administered 2013-05-20: 100 ug via SUBCUTANEOUS

## 2013-05-30 ENCOUNTER — Other Ambulatory Visit: Payer: Self-pay

## 2013-05-30 ENCOUNTER — Encounter: Payer: Self-pay | Admitting: Gastroenterology

## 2013-05-30 DIAGNOSIS — Z1231 Encounter for screening mammogram for malignant neoplasm of breast: Secondary | ICD-10-CM

## 2013-06-03 ENCOUNTER — Telehealth: Payer: Self-pay | Admitting: Emergency Medicine

## 2013-06-03 MED ORDER — BENZONATATE 200 MG PO CAPS: 200.0000 mg | ORAL_CAPSULE | Freq: Four times a day (QID) | ORAL | Status: DC | PRN

## 2013-06-03 NOTE — Telephone Encounter (Signed)
She is taking the right things - chlortrimeton, loratadine, fluticasone, mucinex. She may want to start NSW daily if she is not already doing. Since she is allergic to codeine would try tessalon Perles for cough. She could try OTC delsym ifs she prefers. If she wants tessalon please call in - disp #30, sig 1 po q6h prn cough, RF 1.   She needs to call us this week if not improving  Thanks

## 2013-06-03 NOTE — Telephone Encounter (Signed)
I spoke with pt. She c/o water eyes, runny nose, slight sore throat, PND, cough w/ occasional thick white phlem, blowing out clear phlem x 2 weeks. She has been taking OTC mucinex and chlor trimeton. Pt is requetsing to have something called in since nothing available today. Please advise RB thanks  Allergies  Allergen Reactions  . Codeine Hives and Itching

## 2013-06-03 NOTE — Telephone Encounter (Signed)
Spoke with patient made her aware of recs as listed below per RB Patient aware Rx is sent to verified pharmacy and to call our office back if not improving Nothing further at this time

## 2013-06-04 ENCOUNTER — Encounter (HOSPITAL_COMMUNITY)
Admission: RE | Admit: 2013-06-04 | Discharge: 2013-06-04 | Disposition: A | Discharge status: Home / Self Care | Payer: Medicare Other | Source: Ambulatory Visit | Attending: Nephrology | Admitting: Nephrology

## 2013-06-04 DIAGNOSIS — N184 Chronic kidney disease, stage 4 (severe): Secondary | ICD-10-CM | POA: Insufficient documentation

## 2013-06-04 DIAGNOSIS — D638 Anemia in other chronic diseases classified elsewhere: Secondary | ICD-10-CM | POA: Insufficient documentation

## 2013-06-04 LAB — IRON AND TIBC
Iron: 66 ug/dL (ref 42–135)
UIBC: 196 ug/dL (ref 125–400)

## 2013-06-04 LAB — RENAL FUNCTION PANEL
CO2: 27 mEq/L (ref 19–32)
GFR calc Af Amer: 11 mL/min — ABNORMAL LOW (ref >90)
Glucose, Bld: 69 mg/dL — ABNORMAL LOW (ref 70–99)
Phosphorus: 5.4 mg/dL — ABNORMAL HIGH (ref 2.3–4.6)
Potassium: 2.8 mEq/L — ABNORMAL LOW (ref 3.5–5.1)
Sodium: 141 mEq/L (ref 135–145)

## 2013-06-04 MED ORDER — DARBEPOETIN ALFA-POLYSORBATE 100 MCG/0.5ML IJ SOLN: 100.0000 ug | INTRAMUSCULAR | Status: DC

## 2013-06-10 ENCOUNTER — Telehealth: Payer: Self-pay | Admitting: Emergency Medicine

## 2013-06-10 ENCOUNTER — Ambulatory Visit: Payer: Medicare Other | Admitting: Pulmonary Disease

## 2013-06-10 NOTE — Telephone Encounter (Signed)
Called  Pt was unable to hear clearly was put on hold for 5 minutes will try to call again later.

## 2013-06-10 NOTE — Telephone Encounter (Signed)
Pt called again regarding message Spoke with patient who reports that her symptoms from 7.14.14 phone note have progressively worsened.  Does not feel that the tessalon given at that time have helped at all.  Still c/o watery eyes, sore throat, PND, head and chest congestion with occasional thick white mucus.  Pt denies any increased SOB, wheezing, chest tightness.  Appt scheduled with TP for 7.23.14.  Offered to seek recs from RB for pt to last her until the upcoming appt, but pt declined stating that she will continue the tessalon and otc therapies until ov.  Pt advised to call back if her symptoms worsen prior to ov.  Nothing further needed at this time; will sign off.

## 2013-06-12 ENCOUNTER — Ambulatory Visit (INDEPENDENT_AMBULATORY_CARE_PROVIDER_SITE_OTHER): Payer: Medicare Other | Admitting: Adult Health

## 2013-06-12 ENCOUNTER — Encounter: Payer: Self-pay | Admitting: Adult Health

## 2013-06-12 VITALS — BP 134/62 | HR 80 | Temp 98.6°F | Ht 64.25 in | Wt 207.4 lb

## 2013-06-12 DIAGNOSIS — J449 Chronic obstructive pulmonary disease, unspecified: Secondary | ICD-10-CM

## 2013-06-12 MED ORDER — PREDNISONE 10 MG PO TABS: ORAL_TABLET | ORAL | Status: DC

## 2013-06-12 MED ORDER — AMOXICILLIN-POT CLAVULANATE 875-125 MG PO TABS: 1.0000 | ORAL_TABLET | Freq: Two times a day (BID) | ORAL | Status: AC

## 2013-06-12 MED ORDER — HYDROCODONE-HOMATROPINE 5-1.5 MG/5ML PO SYRP: 5.0000 mL | ORAL_SOLUTION | Freq: Four times a day (QID) | ORAL | Status: DC | PRN

## 2013-06-12 NOTE — Assessment & Plan Note (Signed)
Mild flare with bronchitis   Plan  Augmentin 875mg  Twice daily   -take with food.  Prednisone taper over next week. - monitor sugars, call if >200.  Mucinex DM Twice daily  As needed  Cough/congestion  Hydromet 1-2 tsp every 4-6 hr As needed  Cough-may make you sleepy Follow up Dr. Delton Coombes  In 4 weeks and As needed   Please contact office for sooner follow up if symptoms do not improve or worsen or seek emergency care

## 2013-06-12 NOTE — Progress Notes (Signed)
67 yobf quit smoking after hospitalization [facility] 2009, mixed dz by PFT, chronic cough.  Chronic renal failure   ROV 07/22/10 -- 67 yo woman. Hx mild COPD, CHF, chronic cough. Presented last time with refractory cough - Dr Sherene Sires held Spiriva, fexofenadine, atenolol. New b-b;locker per cards is coreg. Drainage much improved with chlorphenerimine. Cough is much improved. She doesn't miss the Spiriva. She was started on supplimental O2 during hosp admission last week.   ROV 03/22/11 -- 67 yo woman, mixed disease on PFT, hx CHF, chronic cough. Has been taking OTC decongestants without much relief of congestion, sore throat, cough. Started taking loratadine last week. Has been doing nasal saline washes prn.   ROV 04/20/11 - Acute OV, mixed disease, chronic cough. Last time we treated allergies - NSW, nasal steroid, loratadine for flare of coughing. Still takes Nexium. She reports that her cough eased up a bit for a few days, but then went back to same daily cough, productive of clear mucous. Seems to be worse at night. She has some sinus pain. >>tx w/ claritin and nasal rinses and flonase   ROV 05/05/11 Acute OV  Pt presents for an acute office visit. Complains of persistent cough, congestion and drainage. Cough is keeping her up night.  Using no meds for cough. Mucus is yellow and white. Mucus is very thick.  CT sinus done last ov was neg for acute changes.  Cough and congestion are getting worse.   ROV 05/12/11 -- 65 woman, former tobacco, mixed dz on Spirio, chronic cough with allergic rhinitis. She was seen last week with refractory cough, prod of tan mucous, treated for acute bronchitis and is now improved. She remains on loratadine + flonase + [place]. Also on nexium bid. Not on any inhaled medications. She is preparing to get cataract   ROV 07/11/11 -- COPD/mixed disease, chronic cough and allergies. She has been coughing worse for over 2 weeks. More nasal congestion, more pharyngeal drainage, sore throat. Has  been taking her fluticasone, loratadine, NSW's. She tells me that since last time she has been admitted to hospital for CHF exacerbation. No fevers, no purulent secretions. Still on nexium bid.   ROV 08/23/11 -- COPD, coexisting restriction, chronic cough, allergies. Last time changed fluticasone to astopro, continued NSW + loratadine = nexium. Also rx with azithro x 5 days. Still w PND  ROV 01/09/12 -- COPD, coexisting restriction, chronic cough, allergies. Was just hospitalized for CP and gout. She continues to take Nexium daily, allergy regimen >> NSW, Nasal steroid, loratadine. She uses albuterol very rarely.   Acute OV 05/22/12--  Complains of head congestion, clear nasal drainage, occ prod cough with white/clear mucus x2 weeks . Cough is severe at times. -takes her breath she coughs so hard.  Has a lot of drainage throat.  No fever or chest pain .  No edema.   ROV 07/10/12 -- COPD, coexisting restriction, chronic cough, allergies.  She has been having allergic sx, sneezing, cough, worst when she is supine since July. Her GERD is controlled on Nexium - has gone back to taking every day. She is on her NSW, loratadine, fluticasone. In July took doxy + pred + hydromet. Returns with similar sx.    08/28/2012 Acute OV  Complains of prod cough with thick white mucus, upper airway cough, occ SOB - states no better since last ov.  Continues to have cough that rattles all the the time w/ thick mucus.  Mild dyspnea. Some sinus drainage and stuffiness.  Seen 6 weeks ago, cont on cough control measure with GERD /Rhinitis prevention.  Says it is not helping her.  No hemotpysis, abd pain, vomitting, or fever.  CXR today with no acute process.  rec Levaquin 500mg  daily for 7 days -take with food.  Prednisone taper over next week. - monitor sugars, call if >200.  Mucinex DM Twice daily  As needed  Cough/congestion  Hydromet 1-2 tsp every 4-6 hr As needed  Cough-may make you sleepy I will call with xray  results .  Begin Zyrtec 10mg  in am . Stop clairtin  Begin Chlortrimeton 4mg  2 At bedtime   Continue on Zantac Twice daily     02/08/2013 acute ov cough back x 2 weeks on prevacid 15 bid and added mucinex and 1st h1 but continued cough worst at hs, dry, occ albuterol does help but most of time not.   >>prednisone taper.   06/12/2013 Acute OV  Complains of head congestion, runny nose, PND, chest congestion, prod cough with thick white mucus x3 weeks - denies f/c/s, SOB, wheezing, tightness.  Has taken being taking saline washes, mucinex dm without much help. Tessalon is not helping.  Cough keeping her up at night. Coughs all the time.  No fever, chest pain, hemoptysis.  Coughing up thick white mucus.  Seen at PCP 2 weeks ago, given Zpack without any help. Has sinus pain and pressure.     ROS  The following are not active complaints unless bolded sore throat, dysphagia, dental problems, i ear ache,   fever, chills, sweats, unintended wt loss, pleuritic or exertional cp, hemoptysis,  orthopnea pnd or leg swelling, presyncope, palpitations, heartburn, abdominal pain, anorexia, nausea, vomiting, diarrhea  or change in bowel or urinary habits, change in stools or urine, dysuria,hematuria,  rash, arthralgias, visual complaints, headache, numbness weakness or ataxia or problems with walking or coordination,  change in mood/affect or memory.            EXAM :    Gen: Pleasant, obese, in no distress,  normal affect  ENT: No lesions,  mouth clear,  oropharynx clear, ++clear  postnasal drip  Neck: No JVD, no TMG, no carotid bruits  Lungs: No use of accessory muscles, no dullness to percussion, few rhonchi   Cardiovascular: RRR, heart sounds normal, no murmur or gallops, no peripheral edema  Musculoskeletal: No deformities, no cyanosis or clubbing  Neuro: alert, non focal  Skin: Warm, no lesions or rashes     cxr 08/29/12 Cardiac enlargement with pulmonary vascular congestion.

## 2013-06-12 NOTE — Patient Instructions (Addendum)
Augmentin 875mg  Twice daily   -take with food.  Prednisone taper over next week. - monitor sugars, call if >200.  Mucinex DM Twice daily  As needed  Cough/congestion  Hydromet 1-2 tsp every 4-6 hr As needed  Cough-may make you sleepy Follow up Dr. Delton Coombes  In 4 weeks and As needed   Please contact office for sooner follow up if symptoms do not improve or worsen or seek emergency care

## 2013-06-17 ENCOUNTER — Other Ambulatory Visit (HOSPITAL_COMMUNITY): Payer: Self-pay | Admitting: *Deleted

## 2013-06-18 ENCOUNTER — Encounter (HOSPITAL_COMMUNITY)
Admission: RE | Admit: 2013-06-18 | Discharge: 2013-06-18 | Disposition: A | Discharge status: Home / Self Care | Payer: Medicare Other | Source: Ambulatory Visit | Attending: Nephrology | Admitting: Nephrology

## 2013-06-18 LAB — RENAL FUNCTION PANEL
BUN: 72 mg/dL — ABNORMAL HIGH (ref 6–23)
Chloride: 99 mEq/L (ref 96–112)
Glucose, Bld: 269 mg/dL — ABNORMAL HIGH (ref 70–99)
Phosphorus: 4.9 mg/dL — ABNORMAL HIGH (ref 2.3–4.6)
Potassium: 4.4 mEq/L (ref 3.5–5.1)

## 2013-06-18 LAB — POCT HEMOGLOBIN-HEMACUE: Hemoglobin: 13.1 g/dL (ref 12.0–15.0)

## 2013-06-18 MED ORDER — DARBEPOETIN ALFA-POLYSORBATE 100 MCG/0.5ML IJ SOLN: 100.0000 ug | INTRAMUSCULAR | Status: DC

## 2013-06-21 ENCOUNTER — Ambulatory Visit
Admission: RE | Admit: 2013-06-21 | Discharge: 2013-06-21 | Disposition: A | Discharge status: Home / Self Care | Payer: Medicare Other | Source: Ambulatory Visit

## 2013-06-21 DIAGNOSIS — Z1231 Encounter for screening mammogram for malignant neoplasm of breast: Secondary | ICD-10-CM

## 2013-06-24 ENCOUNTER — Other Ambulatory Visit: Payer: Self-pay | Admitting: Internal Medicine

## 2013-06-24 DIAGNOSIS — R928 Other abnormal and inconclusive findings on diagnostic imaging of breast: Secondary | ICD-10-CM

## 2013-07-02 ENCOUNTER — Encounter (HOSPITAL_COMMUNITY)
Admission: RE | Admit: 2013-07-02 | Discharge: 2013-07-02 | Disposition: A | Discharge status: Home / Self Care | Payer: Medicare Other | Source: Ambulatory Visit | Attending: Nephrology | Admitting: Nephrology

## 2013-07-02 DIAGNOSIS — D638 Anemia in other chronic diseases classified elsewhere: Secondary | ICD-10-CM | POA: Insufficient documentation

## 2013-07-02 DIAGNOSIS — N184 Chronic kidney disease, stage 4 (severe): Secondary | ICD-10-CM | POA: Insufficient documentation

## 2013-07-02 LAB — IRON AND TIBC
Iron: 88 ug/dL (ref 42–135)
Saturation Ratios: 33 % (ref 20–55)
UIBC: 180 ug/dL (ref 125–400)

## 2013-07-02 MED ORDER — DARBEPOETIN ALFA-POLYSORBATE 100 MCG/0.5ML IJ SOLN: 100.0000 ug | INTRAMUSCULAR | Status: DC

## 2013-07-08 ENCOUNTER — Other Ambulatory Visit: Payer: Medicare Other

## 2013-07-09 ENCOUNTER — Ambulatory Visit
Admission: RE | Admit: 2013-07-09 | Discharge: 2013-07-09 | Disposition: A | Discharge status: Home / Self Care | Payer: Medicare Other | Source: Ambulatory Visit | Attending: Internal Medicine | Admitting: Internal Medicine

## 2013-07-09 DIAGNOSIS — R928 Other abnormal and inconclusive findings on diagnostic imaging of breast: Secondary | ICD-10-CM

## 2013-07-16 ENCOUNTER — Encounter (HOSPITAL_COMMUNITY)
Admission: RE | Admit: 2013-07-16 | Discharge: 2013-07-16 | Disposition: A | Discharge status: Home / Self Care | Payer: Medicare Other | Source: Ambulatory Visit | Attending: Nephrology | Admitting: Nephrology

## 2013-07-16 LAB — RENAL FUNCTION PANEL
CO2: 29 mEq/L (ref 19–32)
Calcium: 9.6 mg/dL (ref 8.4–10.5)
Creatinine, Ser: 4.74 mg/dL — ABNORMAL HIGH (ref 0.50–1.10)
GFR calc non Af Amer: 9 mL/min — ABNORMAL LOW (ref >90)

## 2013-07-16 MED ORDER — DARBEPOETIN ALFA-POLYSORBATE 100 MCG/0.5ML IJ SOLN
INTRAMUSCULAR | Status: AC
  Filled 2013-07-16: qty 0.5

## 2013-07-16 MED ORDER — DARBEPOETIN ALFA-POLYSORBATE 100 MCG/0.5ML IJ SOLN
100.0000 ug | INTRAMUSCULAR | Status: DC
  Administered 2013-07-16: 100 ug via SUBCUTANEOUS

## 2013-07-19 ENCOUNTER — Encounter: Payer: Self-pay | Admitting: Emergency Medicine

## 2013-07-19 ENCOUNTER — Ambulatory Visit (INDEPENDENT_AMBULATORY_CARE_PROVIDER_SITE_OTHER): Payer: Medicare Other | Admitting: Emergency Medicine

## 2013-07-19 VITALS — BP 150/60 | HR 94 | Ht 65.0 in | Wt 197.8 lb

## 2013-07-19 DIAGNOSIS — J309 Allergic rhinitis, unspecified: Secondary | ICD-10-CM

## 2013-07-19 DIAGNOSIS — R05 Cough: Secondary | ICD-10-CM

## 2013-07-19 DIAGNOSIS — J449 Chronic obstructive pulmonary disease, unspecified: Secondary | ICD-10-CM

## 2013-07-19 NOTE — Assessment & Plan Note (Signed)
Control allergic rhinitis agressively

## 2013-07-19 NOTE — Assessment & Plan Note (Signed)
Continue albuterol prn Walking and resting oximetry today to qualify for O2 with [facility]

## 2013-07-19 NOTE — Patient Instructions (Signed)
Oximetry testing today Continue your zyrtec and fluticasone nasal spray.  Use albuterol if needed Follow with Dr Delton Coombes in 6 months or sooner if you have any problems

## 2013-07-19 NOTE — Assessment & Plan Note (Signed)
Zyrtec + fluticasone bid

## 2013-07-19 NOTE — Progress Notes (Signed)
67 yobf quit smoking after hospitalization [facility] 2009, mixed dz by PFT, chronic cough.  Chronic renal failure   ROV 07/22/10 -- 67 yo woman. Hx mild COPD, CHF, chronic cough. Presented last time with refractory cough - Dr Sherene Sires held Spiriva, fexofenadine, atenolol. New b-b;locker per cards is coreg. Drainage much improved with chlorphenerimine. Cough is much improved. She doesn't miss the Spiriva. She was started on supplimental O2 during hosp admission last week.   ROV 03/22/11 -- 67 yo woman, mixed disease on PFT, hx CHF, chronic cough. Has been taking OTC decongestants without much relief of congestion, sore throat, cough. Started taking loratadine last week. Has been doing nasal saline washes prn.   ROV 04/20/11 - Acute OV, mixed disease, chronic cough. Last time we treated allergies - NSW, nasal steroid, loratadine for flare of coughing. Still takes Nexium. She reports that her cough eased up a bit for a few days, but then went back to same daily cough, productive of clear mucous. Seems to be worse at night. She has some sinus pain. >>tx w/ claritin and nasal rinses and flonase   ROV 05/05/11 Acute OV  Pt presents for an acute office visit. Complains of persistent cough, congestion and drainage. Cough is keeping her up night.  Using no meds for cough. Mucus is yellow and white. Mucus is very thick.  CT sinus done last ov was neg for acute changes.  Cough and congestion are getting worse.   ROV 05/12/11 -- 65 woman, former tobacco, mixed dz on Spirio, chronic cough with allergic rhinitis. She was seen last week with refractory cough, prod of tan mucous, treated for acute bronchitis and is now improved. She remains on loratadine + flonase + [place]. Also on nexium bid. Not on any inhaled medications. She is preparing to get cataract   ROV 07/11/11 -- COPD/mixed disease, chronic cough and allergies. She has been coughing worse for over 2 weeks. More nasal congestion, more pharyngeal drainage, sore throat. Has  been taking her fluticasone, loratadine, NSW's. She tells me that since last time she has been admitted to hospital for CHF exacerbation. No fevers, no purulent secretions. Still on nexium bid.   ROV 08/23/11 -- COPD, coexisting restriction, chronic cough, allergies. Last time changed fluticasone to astopro, continued NSW + loratadine = nexium. Also rx with azithro x 5 days. Still w PND  ROV 01/09/12 -- COPD, coexisting restriction, chronic cough, allergies. Was just hospitalized for CP and gout. She continues to take Nexium daily, allergy regimen >> NSW, Nasal steroid, loratadine. She uses albuterol very rarely.   Acute OV 05/22/12--  Complains of head congestion, clear nasal drainage, occ prod cough with white/clear mucus x2 weeks . Cough is severe at times. -takes her breath she coughs so hard.  Has a lot of drainage throat.  No fever or chest pain .  No edema.   ROV 07/10/12 -- COPD, coexisting restriction, chronic cough, allergies.  She has been having allergic sx, sneezing, cough, worst when she is supine since July. Her GERD is controlled on Nexium - has gone back to taking every day. She is on her NSW, loratadine, fluticasone. In July took doxy + pred + hydromet. Returns with similar sx.    08/28/2012 Acute OV  Complains of prod cough with thick white mucus, upper airway cough, occ SOB - states no better since last ov.  Continues to have cough that rattles all the the time w/ thick mucus.  Mild dyspnea. Some sinus drainage and stuffiness.  Seen 6 weeks ago, cont on cough control measure with GERD /Rhinitis prevention.  Says it is not helping her.  No hemotpysis, abd pain, vomitting, or fever.  CXR today with no acute process.  rec Levaquin 500mg  daily for 7 days -take with food.  Prednisone taper over next week. - monitor sugars, call if >200.  Mucinex DM Twice daily  As needed  Cough/congestion  Hydromet 1-2 tsp every 4-6 hr As needed  Cough-may make you sleepy I will call with xray  results .  Begin Zyrtec 10mg  in am . Stop clairtin  Begin Chlortrimeton 4mg  2 At bedtime   Continue on Zantac Twice daily     02/08/2013 acute ov cough back x 2 weeks on prevacid 15 bid and added mucinex and 1st h1 but continued cough worst at hs, dry, occ albuterol does help but most of time not.   >>prednisone taper.   Acute OV 06/12/13 Complains of head congestion, runny nose, PND, chest congestion, prod cough with thick white mucus x3 weeks - denies f/c/s, SOB, wheezing, tightness.  Has taken being taking saline washes, mucinex dm without much help. Tessalon is not helping.  Cough keeping her up at night. Coughs all the time.  No fever, chest pain, hemoptysis.  Coughing up thick white mucus.  Seen at PCP 2 weeks ago, given Zpack without any help. Has sinus pain and pressure.   ROV 07/19/13 -- follow up, chronic cough, mixed disease on spiro, GERD + PND. Treated in 7/'14 with augmentin + pred taper. She on zyrtec + fluticasone bid. She has albuterol available prn. She is working on getting on [facility] tx list. 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 Needs oximetry today.     EXAM :   Filed Vitals:   07/19/13 1358  BP: 150/60  Pulse: 94  Height: 5\' 5"  (1.651 m)  Weight: 197 lb 12.8 oz (89.721 kg)  SpO2: 93%   Gen: Pleasant, obese, in no distress,  normal affect  ENT: No lesions,  mouth clear,  oropharynx clear, posterior pharynx red  Neck: No JVD, no TMG, no carotid bruits  Lungs: No use of accessory muscles, clear B   Cardiovascular: RRR, heart sounds normal, no murmur or gallops, no peripheral edema  Musculoskeletal: No deformities, no cyanosis or clubbing  Neuro: alert, non focal  Skin: Warm, no lesions or rashes   COPD, MILD Continue albuterol prn Walking and resting oximetry today to qualify for O2 with [facility]  ALLERGIC RHINITIS Zyrtec + fluticasone bid  COUGH Control allergic rhinitis agressively

## 2013-07-23 ENCOUNTER — Encounter: Payer: Self-pay | Admitting: *Deleted

## 2013-07-24 ENCOUNTER — Ambulatory Visit (AMBULATORY_SURGERY_CENTER): Payer: Self-pay | Admitting: *Deleted

## 2013-07-24 VITALS — Ht 65.5 in | Wt 198.6 lb

## 2013-07-24 DIAGNOSIS — Z86010 Personal history of colonic polyps: Secondary | ICD-10-CM

## 2013-07-24 MED ORDER — MOVIPREP 100 G PO SOLR: ORAL | Status: DC

## 2013-07-24 NOTE — Progress Notes (Signed)
No allergies to eggs or soy. No problems with anesthesia.  

## 2013-07-30 ENCOUNTER — Encounter (HOSPITAL_COMMUNITY)
Admission: RE | Admit: 2013-07-30 | Discharge: 2013-07-30 | Disposition: A | Discharge status: Home / Self Care | Payer: Medicare Other | Source: Ambulatory Visit | Attending: Nephrology | Admitting: Nephrology

## 2013-07-30 DIAGNOSIS — N184 Chronic kidney disease, stage 4 (severe): Secondary | ICD-10-CM | POA: Insufficient documentation

## 2013-07-30 DIAGNOSIS — D638 Anemia in other chronic diseases classified elsewhere: Secondary | ICD-10-CM | POA: Insufficient documentation

## 2013-07-30 LAB — FERRITIN: Ferritin: 261 ng/mL (ref 10–291)

## 2013-07-30 LAB — POCT HEMOGLOBIN-HEMACUE: Hemoglobin: 12.4 g/dL (ref 12.0–15.0)

## 2013-07-30 LAB — IRON AND TIBC: TIBC: 269 ug/dL (ref 250–470)

## 2013-07-30 MED ORDER — DARBEPOETIN ALFA-POLYSORBATE 100 MCG/0.5ML IJ SOLN: 100.0000 ug | INTRAMUSCULAR | Status: DC

## 2013-08-07 ENCOUNTER — Encounter: Payer: Medicare Other | Admitting: Gastroenterology

## 2013-08-13 ENCOUNTER — Encounter (HOSPITAL_COMMUNITY)
Admission: RE | Admit: 2013-08-13 | Discharge: 2013-08-13 | Disposition: A | Discharge status: Home / Self Care | Payer: Medicare Other | Source: Ambulatory Visit | Attending: Nephrology | Admitting: Nephrology

## 2013-08-13 LAB — POCT HEMOGLOBIN-HEMACUE: Hemoglobin: 12.5 g/dL (ref 12.0–15.0)

## 2013-08-13 LAB — RENAL FUNCTION PANEL
Albumin: 3.1 g/dL — ABNORMAL LOW (ref 3.5–5.2)
Calcium: 9.4 mg/dL (ref 8.4–10.5)
Chloride: 103 mEq/L (ref 96–112)
Creatinine, Ser: 3.51 mg/dL — ABNORMAL HIGH (ref 0.50–1.10)
GFR calc non Af Amer: 12 mL/min — ABNORMAL LOW (ref >90)

## 2013-08-13 MED ORDER — DARBEPOETIN ALFA-POLYSORBATE 100 MCG/0.5ML IJ SOLN: 100.0000 ug | INTRAMUSCULAR | Status: DC

## 2013-08-19 ENCOUNTER — Encounter: Payer: Self-pay | Admitting: Gastroenterology

## 2013-08-19 ENCOUNTER — Ambulatory Visit (AMBULATORY_SURGERY_CENTER): Payer: Medicare Other | Admitting: Gastroenterology

## 2013-08-19 VITALS — BP 172/82 | HR 78 | Temp 97.7°F | Resp 16 | Ht 64.5 in | Wt 197.0 lb

## 2013-08-19 DIAGNOSIS — Z860100 Personal history of colon polyps, unspecified: Secondary | ICD-10-CM

## 2013-08-19 DIAGNOSIS — D126 Benign neoplasm of colon, unspecified: Secondary | ICD-10-CM

## 2013-08-19 LAB — GLUCOSE, CAPILLARY
Glucose-Capillary: 109 mg/dL — ABNORMAL HIGH (ref 70–99)
Glucose-Capillary: 47 mg/dL — ABNORMAL LOW (ref 70–99)
Glucose-Capillary: 91 mg/dL (ref 70–99)

## 2013-08-19 MED ORDER — DEXTROSE 5 % IV SOLN: INTRAVENOUS | Status: DC

## 2013-08-19 MED ORDER — SODIUM CHLORIDE 0.9 % IV SOLN: 500.0000 mL | INTRAVENOUS | Status: DC

## 2013-08-19 MED ORDER — DEXTROSE 50 % IV SOLN: 25.0000 mL | Freq: Once | INTRAVENOUS | Status: DC

## 2013-08-19 NOTE — Progress Notes (Signed)
Patient states she uses O2 at home periodically and has used it at night for the past 2 weeks.  Supervisor made aware.  CRNA, D. Merrit aware.

## 2013-08-19 NOTE — Progress Notes (Signed)
Patient did not experience any of the following events: a burn prior to discharge; a fall within the facility; wrong site/side/patient/procedure/implant event; or a hospital transfer or hospital admission upon discharge from the facility. (G8907) Patient did not have preoperative order for IV antibiotic SSI prophylaxis. (G8918)  

## 2013-08-19 NOTE — Op Note (Signed)
[facility] [address] [place], [address]   COLONOSCOPY PROCEDURE REPORT  PATIENT: [name], [name].  MR#: [id number] BIRTHDATE: 01/06/1946 , 67  yrs. old GENDER: Female ENDOSCOPIST: Louis Meckel, MD REFERRED [id number] Oneta Rack, M.D. PROCEDURE DATE:  08/19/2013 PROCEDURE:   Colonoscopy with cold biopsy polypectomy, Colonoscopy with snare polypectomy, and Submucosal injection, any substance First Screening Colonoscopy - Avg.  risk and is 50 yrs.  old or older - No.  Prior Negative Screening - Now for repeat screening. N/A  History of Adenoma - Now for follow-up colonoscopy & has been > or = to 3 yrs.  Yes hx of adenoma.  Has been 3 or more years since last colonoscopy.  Polyps Removed Today? Yes. ASA CLASS:   Class II INDICATIONS:Patient's personal history of adenomatous colon polyps. colon polyps removed 2000, 2005, 2008 MEDICATIONS: MAC sedation, administered by CRNA and propofol (Diprivan) 500mg  IV  DESCRIPTION OF PROCEDURE:   After the risks benefits and alternatives of the procedure were thoroughly explained, informed consent was obtained.  A digital rectal exam revealed no abnormalities of the rectum.   The LB [id number]  endoscope was introduced through the anus and advanced to the cecum, which was identified by both the appendix and ileocecal valve. No adverse events experienced.   The quality of the prep was Suprep fair  The instrument was then slowly withdrawn as the colon was fully examined.      COLON FINDINGS: There were 5 polyps seen in the ascending colon.. There was a single 2 mm polyp that was removed with cold polypectomy snare.  The polyps ranged from 10-15 mm.  A 15 mm polyp was removed after injecting 2 cc normal saline submucosally.  Each polyp was removed with hot polypectomy snare and submitted to pathology. In the hepatic flexure there is a sessile 2-2.5 cm polyp.  The submucosa was injected with 2.5 cc normal saline  and the polyp was removed with hot polypectomy snare, piecemeal.  Polyp remnants were left.  Specimens were submitted to pathology.  The surrounding area was marked with spot. In the descending colon there was a sessile 15 mm polyp.  This was removed with hot polypectomy snare and submitted to pathology.  the area was marked with Spot.  Retroflexed views revealed no abnormalities. The time to cecum=5 minutes 52 seconds.  Withdrawal time=66 minutes 14 seconds.  The scope was withdrawn and the procedure completed. COMPLICATIONS: There were no complications.  ENDOSCOPIC IMPRESSION: colonic polyposis  RECOMMENDATIONS: Await pathology results.  If benign changes only are seen within patient will require followup colonoscopy with ERBE to remove the polyp remnants in the hepatic flexure   eSigned:  Louis Meckel, MD 08/19/2013 12:36 PM   cc:   PATIENT NAME:  [name]. MR#: [id number]

## 2013-08-19 NOTE — Progress Notes (Signed)
Called to room to assist during endoscopic procedure.  Patient ID and intended procedure confirmed with present staff. Received instructions for my participation in the procedure from the performing physician.  

## 2013-08-19 NOTE — Progress Notes (Signed)
Pt. Repositioned side to side and ambulated to B.R. Pt. Stated she has no gas. Pt. Denies pain and abdomen soft non distended. Pt. Stated I just want to go home. Dr. Arlyce Dice made aware. Instructed pt. If she has any difficulty when she gets home to please call us,pt. Verbalize understanding. Pt.  At point of D/C stable no distress noted.

## 2013-08-19 NOTE — Patient Instructions (Addendum)

## 2013-08-20 ENCOUNTER — Telehealth: Payer: Self-pay | Admitting: *Deleted

## 2013-08-20 NOTE — Telephone Encounter (Signed)
  Follow up Call-  Call back number 08/19/2013  Post procedure Call Back phone  # [phone]  Permission to leave phone message Yes     Patient questions:  Do you have a fever, pain , or abdominal swelling? no Pain Score  0 *  Have you tolerated food without any problems? yes  Have you been able to return to your normal activities? yes  Do you have any questions about your discharge instructions: Diet   no Medications  no Follow up visit  no  Do you have questions or concerns about your Care? no  Actions: * If pain score is 4 or above: No action needed, pain <4.

## 2013-08-22 ENCOUNTER — Encounter: Payer: Self-pay | Admitting: Gastroenterology

## 2013-08-27 ENCOUNTER — Encounter (HOSPITAL_COMMUNITY)
Admission: RE | Admit: 2013-08-27 | Discharge: 2013-08-27 | Disposition: A | Discharge status: Home / Self Care | Payer: Medicare Other | Source: Ambulatory Visit | Attending: Nephrology | Admitting: Nephrology

## 2013-08-27 DIAGNOSIS — D638 Anemia in other chronic diseases classified elsewhere: Secondary | ICD-10-CM | POA: Insufficient documentation

## 2013-08-27 DIAGNOSIS — N184 Chronic kidney disease, stage 4 (severe): Secondary | ICD-10-CM | POA: Insufficient documentation

## 2013-08-27 LAB — POCT HEMOGLOBIN-HEMACUE: Hemoglobin: 11.8 g/dL — ABNORMAL LOW (ref 12.0–15.0)

## 2013-08-27 LAB — IRON AND TIBC: UIBC: 218 ug/dL (ref 125–400)

## 2013-08-27 MED ORDER — DARBEPOETIN ALFA-POLYSORBATE 100 MCG/0.5ML IJ SOLN
100.0000 ug | INTRAMUSCULAR | Status: DC
  Administered 2013-08-27: 11:00:00 100 ug via SUBCUTANEOUS

## 2013-08-27 MED ORDER — DARBEPOETIN ALFA-POLYSORBATE 100 MCG/0.5ML IJ SOLN
INTRAMUSCULAR | Status: AC
  Filled 2013-08-27: qty 0.5

## 2013-09-04 ENCOUNTER — Telehealth: Payer: Self-pay | Admitting: *Deleted

## 2013-09-10 ENCOUNTER — Encounter (HOSPITAL_COMMUNITY): Payer: Medicare Other

## 2013-09-11 NOTE — Telephone Encounter (Signed)
PREVISIT DEC 1 MON AT 2:30PM  DEC 8TH COLON WITH ERBY 12-8   PT AWARE

## 2013-09-16 ENCOUNTER — Other Ambulatory Visit (HOSPITAL_COMMUNITY): Payer: Self-pay | Admitting: *Deleted

## 2013-09-17 ENCOUNTER — Encounter (HOSPITAL_COMMUNITY)
Admission: RE | Admit: 2013-09-17 | Discharge: 2013-09-17 | Disposition: A | Discharge status: Home / Self Care | Payer: Medicare Other | Source: Ambulatory Visit | Attending: Nephrology | Admitting: Nephrology

## 2013-09-17 LAB — POCT HEMOGLOBIN-HEMACUE: Hemoglobin: 13.6 g/dL (ref 12.0–15.0)

## 2013-09-17 MED ORDER — DARBEPOETIN ALFA-POLYSORBATE 100 MCG/0.5ML IJ SOLN: 100.0000 ug | INTRAMUSCULAR | Status: DC

## 2013-10-01 ENCOUNTER — Inpatient Hospital Stay (HOSPITAL_COMMUNITY): Admission: RE | Admit: 2013-10-01 | Payer: Medicare Other | Source: Ambulatory Visit

## 2013-10-02 ENCOUNTER — Encounter (HOSPITAL_COMMUNITY): Payer: Self-pay | Admitting: *Deleted

## 2013-10-02 HISTORY — PX: COLONOSCOPY W/ POLYPECTOMY: SHX1380

## 2013-10-03 ENCOUNTER — Encounter (HOSPITAL_COMMUNITY): Payer: Self-pay | Admitting: *Deleted

## 2013-10-03 HISTORY — PX: INSERTION OF DIALYSIS CATHETER: SHX1324

## 2013-10-14 ENCOUNTER — Encounter (HOSPITAL_COMMUNITY)
Admission: RE | Admit: 2013-10-14 | Discharge: 2013-10-14 | Disposition: A | Discharge status: Home / Self Care | Payer: Medicare Other | Source: Ambulatory Visit | Attending: Nephrology | Admitting: Nephrology

## 2013-10-14 DIAGNOSIS — D638 Anemia in other chronic diseases classified elsewhere: Secondary | ICD-10-CM | POA: Insufficient documentation

## 2013-10-14 DIAGNOSIS — N184 Chronic kidney disease, stage 4 (severe): Secondary | ICD-10-CM | POA: Insufficient documentation

## 2013-10-14 LAB — RENAL FUNCTION PANEL
Albumin: 3.6 g/dL (ref 3.5–5.2)
CO2: 35 mEq/L — ABNORMAL HIGH (ref 19–32)
Calcium: 10.4 mg/dL (ref 8.4–10.5)
Chloride: 98 mEq/L (ref 96–112)
GFR calc Af Amer: 12 mL/min — ABNORMAL LOW (ref >90)
GFR calc non Af Amer: 10 mL/min — ABNORMAL LOW (ref >90)
Glucose, Bld: 65 mg/dL — ABNORMAL LOW (ref 70–99)
Sodium: 144 mEq/L (ref 135–145)

## 2013-10-14 LAB — POCT HEMOGLOBIN-HEMACUE: Hemoglobin: 13.7 g/dL (ref 12.0–15.0)

## 2013-10-14 LAB — FERRITIN: Ferritin: 223 ng/mL (ref 10–291)

## 2013-10-14 LAB — IRON AND TIBC: Saturation Ratios: 16 % — ABNORMAL LOW (ref 20–55)

## 2013-10-14 MED ORDER — DARBEPOETIN ALFA-POLYSORBATE 100 MCG/0.5ML IJ SOLN: 100.0000 ug | INTRAMUSCULAR | Status: DC

## 2013-10-15 LAB — PTH, INTACT AND CALCIUM: Calcium, Total (PTH): 10.2 mg/dL (ref 8.4–10.5)

## 2013-10-16 ENCOUNTER — Encounter (HOSPITAL_COMMUNITY): Payer: Self-pay | Admitting: Pharmacy Technician

## 2013-10-21 ENCOUNTER — Ambulatory Visit (AMBULATORY_SURGERY_CENTER): Payer: Medicare Other | Admitting: *Deleted

## 2013-10-21 VITALS — Ht 65.5 in | Wt 187.4 lb

## 2013-10-21 DIAGNOSIS — Z860100 Personal history of colon polyps, unspecified: Secondary | ICD-10-CM

## 2013-10-21 MED ORDER — NA SULFATE-K SULFATE-MG SULF 17.5-3.13-1.6 GM/177ML PO SOLN: 1.0000 | Freq: Once | ORAL | Status: DC

## 2013-10-21 NOTE — Progress Notes (Signed)
Report from previous colonoscopy states patient took suprep with fair results. Per patient, she states she took Moviprep. Discussed with Dr. Arlyce Dice and he states that he would like her to have suprep this time.

## 2013-10-21 NOTE — Progress Notes (Signed)
Denies allergies to eggs or soy products. Denies complications with sedation or anesthesia. 

## 2013-10-23 ENCOUNTER — Other Ambulatory Visit: Payer: Self-pay | Admitting: *Deleted

## 2013-10-23 ENCOUNTER — Other Ambulatory Visit: Payer: Self-pay | Admitting: Internal Medicine

## 2013-10-23 DIAGNOSIS — Z86010 Personal history of colonic polyps: Secondary | ICD-10-CM

## 2013-10-28 ENCOUNTER — Ambulatory Visit (HOSPITAL_COMMUNITY)
Admission: RE | Admit: 2013-10-28 | Discharge: 2013-10-28 | Disposition: A | Discharge status: Home / Self Care | Payer: Medicare Other | Source: Ambulatory Visit | Attending: Gastroenterology | Admitting: Gastroenterology

## 2013-10-28 ENCOUNTER — Ambulatory Visit (HOSPITAL_COMMUNITY): Payer: Medicare Other | Admitting: Anesthesiology

## 2013-10-28 ENCOUNTER — Encounter (HOSPITAL_COMMUNITY): Payer: Medicare Other

## 2013-10-28 ENCOUNTER — Encounter (HOSPITAL_COMMUNITY): Payer: Self-pay | Admitting: *Deleted

## 2013-10-28 ENCOUNTER — Encounter (HOSPITAL_COMMUNITY)
Admission: RE | Disposition: A | Discharge status: Home / Self Care | Payer: Self-pay | Source: Ambulatory Visit | Attending: Gastroenterology

## 2013-10-28 ENCOUNTER — Encounter (HOSPITAL_COMMUNITY): Payer: Medicare Other | Admitting: Anesthesiology

## 2013-10-28 DIAGNOSIS — K219 Gastro-esophageal reflux disease without esophagitis: Secondary | ICD-10-CM | POA: Insufficient documentation

## 2013-10-28 DIAGNOSIS — Z860100 Personal history of colon polyps, unspecified: Secondary | ICD-10-CM

## 2013-10-28 DIAGNOSIS — E119 Type 2 diabetes mellitus without complications: Secondary | ICD-10-CM | POA: Insufficient documentation

## 2013-10-28 DIAGNOSIS — J4489 Other specified chronic obstructive pulmonary disease: Secondary | ICD-10-CM | POA: Insufficient documentation

## 2013-10-28 DIAGNOSIS — Z87891 Personal history of nicotine dependence: Secondary | ICD-10-CM | POA: Insufficient documentation

## 2013-10-28 DIAGNOSIS — J449 Chronic obstructive pulmonary disease, unspecified: Secondary | ICD-10-CM | POA: Insufficient documentation

## 2013-10-28 DIAGNOSIS — E785 Hyperlipidemia, unspecified: Secondary | ICD-10-CM | POA: Insufficient documentation

## 2013-10-28 DIAGNOSIS — D126 Benign neoplasm of colon, unspecified: Secondary | ICD-10-CM

## 2013-10-28 DIAGNOSIS — I10 Essential (primary) hypertension: Secondary | ICD-10-CM | POA: Insufficient documentation

## 2013-10-28 HISTORY — PX: HOT HEMOSTASIS: SHX5433

## 2013-10-28 HISTORY — DX: Chronic obstructive pulmonary disease, unspecified: J44.9

## 2013-10-28 HISTORY — PX: COLONOSCOPY: SHX5424

## 2013-10-28 LAB — GLUCOSE, CAPILLARY
Glucose-Capillary: 38 mg/dL — CL (ref 70–99)
Glucose-Capillary: 133 mg/dL — ABNORMAL HIGH (ref 70–99)

## 2013-10-28 SURGERY — COLONOSCOPY
Anesthesia: Monitor Anesthesia Care

## 2013-10-28 MED ORDER — PROPOFOL 10 MG/ML IV BOLUS
INTRAVENOUS | Status: AC
  Filled 2013-10-28: qty 20

## 2013-10-28 MED ORDER — SODIUM CHLORIDE 0.9 % IV SOLN: INTRAVENOUS | Status: DC

## 2013-10-28 MED ORDER — GLUCAGON HCL (RDNA) 1 MG IJ SOLR
INTRAMUSCULAR | Status: AC
  Filled 2013-10-28: qty 1

## 2013-10-28 MED ORDER — LIDOCAINE HCL (CARDIAC) 20 MG/ML IV SOLN
INTRAVENOUS | Status: DC | PRN
  Administered 2013-10-28: 50 mg via INTRAVENOUS

## 2013-10-28 MED ORDER — GLUCAGON HCL (RDNA) 1 MG IJ SOLR
INTRAMUSCULAR | Status: DC | PRN
  Administered 2013-10-28: 1 mg via INTRAVENOUS

## 2013-10-28 MED ORDER — PROPOFOL INFUSION 10 MG/ML OPTIME
INTRAVENOUS | Status: DC | PRN
  Administered 2013-10-28: 120 ug/kg/min via INTRAVENOUS

## 2013-10-28 MED ORDER — PROMETHAZINE HCL 25 MG/ML IJ SOLN: 6.2500 mg | INTRAMUSCULAR | Status: DC | PRN

## 2013-10-28 MED ORDER — LIDOCAINE HCL (CARDIAC) 20 MG/ML IV SOLN
INTRAVENOUS | Status: AC
  Filled 2013-10-28: qty 5

## 2013-10-28 MED ORDER — DEXTROSE 50 % IV SOLN
25.0000 g | Freq: Once | INTRAVENOUS | Status: AC
  Administered 2013-10-28: 12.5 g via INTRAVENOUS
  Filled 2013-10-28: qty 50

## 2013-10-28 MED ORDER — METHYLENE BLUE 1 % INJ SOLN
INTRAMUSCULAR | Status: AC
  Filled 2013-10-28: qty 10

## 2013-10-28 MED ORDER — PROPOFOL 10 MG/ML IV BOLUS
INTRAVENOUS | Status: DC | PRN
  Administered 2013-10-28: 50 mg via INTRAVENOUS

## 2013-10-28 MED ORDER — LACTATED RINGERS IV SOLN
INTRAVENOUS | Status: DC | PRN
  Administered 2013-10-28: 10:00:00 via INTRAVENOUS

## 2013-10-28 NOTE — H&P (Signed)
History of Present Illness:  67 year old Afro-American female with history of diabetes, congestive heart failure, hypertension, COPD and renal insufficiency here for followup colonoscopy.  In September, 2014 multiple colon polyps were removed.  A sessile polyp in the hepatic flexure was incompletely removed.  She is here for followup colonoscopy and polyp removal.    Past Medical History  Diagnosis Date  . Type II or unspecified type diabetes mellitus without mention of complication, not stated as uncontrolled   . Esophageal reflux   . DJD (degenerative joint disease)   . Depressive disorder, not elsewhere classified   . Unspecified essential hypertension   . Chronic cough   . CHF (congestive heart failure)     with well preserved EF   . HTN (hypertension)   . HLD (hyperlipidemia)   . Anxiety   . Anemia   . Shortness of breath   . COPD (chronic obstructive pulmonary disease)     mild-" oxygen use 2 l/m nightly"  . Renal insufficiency     function is stable at present-Dr. Hyman Hopes follows   Past Surgical History  Procedure Laterality Date  . Insertion of dialysis catheter Left 10-03-13    3 yrs ago -Left upper arm AVGG  . Vascular surgery Left 2011    arm  . Colonoscopy w/ polypectomy  10-02-13    hx. past 6 months -2 removed   family history includes Cancer in her sister; Diabetes in an other family member; Heart disease in her father; Hypertension in an other family member. There is no history of Colon cancer. Current Facility-Administered Medications  Medication Dose Route Frequency Provider Last Rate Last Dose  . 0.9 %  sodium chloride infusion   Intravenous Continuous Louis Meckel, MD      . promethazine [facility]) injection 6.25-12.5 mg  6.25-12.5 mg Intravenous Q15 min PRN Eilene Ghazi, MD       Facility-Administered Medications Ordered in Other Encounters  Medication Dose Route Frequency Provider Last Rate Last Dose  . lactated ringers infusion    Continuous PRN Peggy  Williford       Allergies as of 09/11/2013 - Review Complete 08/27/2013  Allergen Reaction Noted  . Codeine Hives and Itching 02/04/2009    reports that she quit smoking about 5 years ago. Her smoking use included Cigarettes. She has a 30 pack-year smoking history. She has never used smokeless tobacco. She reports that she does not drink alcohol or use illicit drugs.     Review of Systems: Pertinent positive and negative review of systems were noted in the above HPI section. All other review of systems were otherwise negative.  Vital signs were reviewed in today's medical record Physical Exam: General: Well developed , well nourished, no acute distress Skin: anicteric Head: Normocephalic and atraumatic Eyes:  sclerae anicteric, EOMI Ears: Normal auditory acuity Mouth: No deformity or lesions Neck: Supple, no masses or thyromegaly Lungs: Clear throughout to auscultation Heart: Regular rate and rhythm; no murmurs, rubs or bruits Abdomen: Soft, non tender and non distended. No masses, hepatosplenomegaly or hernias noted. Normal Bowel sounds Rectal:deferred Musculoskeletal: Symmetrical with no gross deformities  Skin: No lesions on visible extremities Pulses:  Normal pulses noted Extremities: No clubbing, cyanosis, edema or deformities noted Neurological: Alert oriented x 4, grossly nonfocal Cervical Nodes:  No significant cervical adenopathy Inguinal Nodes: No significant inguinal adenopathy Psychological:  Alert and cooperative. Normal mood and affect  Impression-colonic polyposis with incompletely removed polyp hepatic flexu  Recommendations #1 colonoscopy

## 2013-10-28 NOTE — Transfer of Care (Signed)
Immediate Anesthesia Transfer of Care Note  Patient: [name]  Procedure(s) Performed: Procedure(s): COLONOSCOPY (N/A) HOT HEMOSTASIS (ARGON PLASMA COAGULATION/BICAP) (N/A)  Patient Location: PACU  Anesthesia Type:MAC  Level of Consciousness: awake, alert  and oriented  Airway & Oxygen Therapy: Patient Spontanous Breathing and Patient connected to face mask oxygen  Post-op Assessment: Report given to PACU RN and Post -op Vital signs reviewed and stable  Post vital signs: Reviewed and stable  Complications: No apparent anesthesia complications

## 2013-10-28 NOTE — Anesthesia Preprocedure Evaluation (Signed)
Anesthesia Evaluation  Patient identified by MRN, date of birth, ID band Patient awake    Reviewed: Allergy & Precautions, H&P , NPO status , Patient's Chart, lab work & pertinent test results  Airway Mallampati: II TM Distance: >3 FB Neck ROM: Full    Dental no notable dental hx.    Pulmonary neg pulmonary ROS, COPD oxygen dependent, former smoker,  breath sounds clear to auscultation  Pulmonary exam normal       Cardiovascular hypertension, Pt. on medications Rhythm:Regular Rate:Normal     Neuro/Psych negative neurological ROS  negative psych ROS   GI/Hepatic negative GI ROS, Neg liver ROS,   Endo/Other  diabetes, Insulin Dependent  Renal/GU Renal InsufficiencyRenal disease  negative genitourinary   Musculoskeletal negative musculoskeletal ROS (+)   Abdominal   Peds negative pediatric ROS (+)  Hematology   Anesthesia Other Findings   Reproductive/Obstetrics negative OB ROS                           Anesthesia Physical Anesthesia Plan  ASA: III  Anesthesia Plan: MAC   Post-op Pain Management:    Induction: Intravenous  Airway Management Planned: Nasal Cannula  Additional Equipment:   Intra-op Plan:   Post-operative Plan:   Informed Consent: I have reviewed the patients History and Physical, chart, labs and discussed the procedure including the risks, benefits and alternatives for the proposed anesthesia with the patient or authorized representative who has indicated his/her understanding and acceptance.     Plan Discussed with: CRNA and Surgeon  Anesthesia Plan Comments:         Anesthesia Quick Evaluation

## 2013-10-28 NOTE — Op Note (Addendum)
[facility] [address] [place], [address]   COLONOSCOPY PROCEDURE REPORT  PATIENT: [name], [name].  MR#: [id number] BIRTHDATE: 10-20-1946 , 67  yrs. old GENDER: Female ENDOSCOPIST: Louis Meckel, MD REFERRED AO:[id number] Oneta Rack, M.D. PROCEDURE DATE:  10/28/2013 PROCEDURE:   Colonoscopy with biopsy, Colonoscopy with snare polypectomy, Submucosal injection, any substance, and Colonoscopy with tissue ablation ASA CLASS:   Class II INDICATIONS:Patient's personal history of adenomatous colon polyps. 2005, 2008, 2014.  Colonoscopy in September, 2014 demonstrated multiple descending colon polyps which were removed.  A large polyp in the hepatic flexure was incompletely removed. MEDICATIONS: MAC sedation, administered by CRNA  DESCRIPTION OF PROCEDURE:   After the risks benefits and alternatives of the procedure were thoroughly explained, informed consent was obtained.  A digital rectal exam revealed no abnormalities of the rectum.   The Pentax Colonoscope [id number] endoscope was introduced through the anus and advanced to the cecum, which was identified by both the appendix and ileocecal valve. No adverse events experienced.   The quality of the prep was Suprep good  The instrument was then slowly withdrawn as the colon was fully examined.      COLON FINDINGS: A smooth sessile polyp measuring 4 mm in size was found in the ascending colon.  The overlying mucosa was smooth and slightly reddened.  I attempted to inject the base with normal saline but no lifting was observed.  It was unclear whether this was a polyp remnant or simply a scar.  Biopsies only were taken. Two sessile polyps were found at the hepatic flexure.  These polyps were located in the area that was previously marked with [place] ink. The smaller polyp measured approximately 10 mm and the larger polyp, which was partially removed, measured approximately 15 mm. The first polyp was injected with  approximately 6 cc normal saline submucosally and then removed piecemeal.  Polyp remnants were fulgurated utilizing the argon plasma coagulator.  The larger polyp, which was previously partially removed, was injected submucosally with methylene blue impregnated normal saline with approximately 9 cc.  The polyp was then removed with polypectomy snare utilizing cautery, piecemeal.  Polyp remnants were fulgurated utilizing the argon plasma coagulator.  Polyps were retrieved for pathology. A sessile polyp measuring 3 mm in size was found in the descending colon.  A polypectomy was performed with a cold snare. The resection was complete and the polyp tissue was not retrieved. The time to cecum=  .  Withdrawal time=minutes 0 seconds.  The scope was withdrawn and the procedure completed. COMPLICATIONS: There were no complications.  ENDOSCOPIC IMPRESSION: Colonic polyposis  RECOMMENDATIONS: If pathology does not demonstrate atypia, followup colonoscopy in one year  eSigned:  Louis Meckel, MD 10/28/2013 12:34 PM Revised: 10/28/2013 12:34 PM  cc:   PATIENT NAME:  [name]. MR#: [id number]

## 2013-10-28 NOTE — Anesthesia Postprocedure Evaluation (Signed)
  Anesthesia Post-op Note  Patient: [name]  Procedure(s) Performed: Procedure(s) (LRB): COLONOSCOPY (N/A) HOT HEMOSTASIS (ARGON PLASMA COAGULATION/BICAP) (N/A)  Patient Location: PACU  Anesthesia Type: MAC  Level of Consciousness: awake and alert   Airway and Oxygen Therapy: Patient Spontanous Breathing  Post-op Pain: mild  Post-op Assessment: Post-op Vital signs reviewed, Patient's Cardiovascular Status Stable, Respiratory Function Stable, Patent Airway and No signs of Nausea or vomiting  Last Vitals:  Filed Vitals:   10/28/13 1215  BP: 159/75  Temp:   Resp: 17    Post-op Vital Signs: stable   Complications: No apparent anesthesia complications

## 2013-10-30 ENCOUNTER — Encounter (HOSPITAL_COMMUNITY): Payer: Self-pay | Admitting: Gastroenterology

## 2013-10-30 ENCOUNTER — Other Ambulatory Visit (HOSPITAL_COMMUNITY): Payer: Self-pay | Admitting: *Deleted

## 2013-10-31 ENCOUNTER — Encounter (HOSPITAL_COMMUNITY)
Admission: RE | Admit: 2013-10-31 | Discharge: 2013-10-31 | Disposition: A | Discharge status: Home / Self Care | Payer: Medicare Other | Source: Ambulatory Visit | Attending: Nephrology | Admitting: Nephrology

## 2013-10-31 ENCOUNTER — Encounter: Payer: Self-pay | Admitting: Gastroenterology

## 2013-10-31 DIAGNOSIS — N184 Chronic kidney disease, stage 4 (severe): Secondary | ICD-10-CM | POA: Insufficient documentation

## 2013-10-31 DIAGNOSIS — D638 Anemia in other chronic diseases classified elsewhere: Secondary | ICD-10-CM | POA: Insufficient documentation

## 2013-10-31 LAB — RENAL FUNCTION PANEL
CO2: 29 mEq/L (ref 19–32)
Calcium: 9.2 mg/dL (ref 8.4–10.5)
Chloride: 99 mEq/L (ref 96–112)
GFR calc Af Amer: 12 mL/min — ABNORMAL LOW (ref >90)
GFR calc non Af Amer: 10 mL/min — ABNORMAL LOW (ref >90)
Glucose, Bld: 277 mg/dL — ABNORMAL HIGH (ref 70–99)
Potassium: 4 mEq/L (ref 3.5–5.1)
Sodium: 139 mEq/L (ref 135–145)

## 2013-10-31 LAB — POCT HEMOGLOBIN-HEMACUE: Hemoglobin: 12.7 g/dL (ref 12.0–15.0)

## 2013-10-31 MED ORDER — DARBEPOETIN ALFA-POLYSORBATE 100 MCG/0.5ML IJ SOLN: 100.0000 ug | INTRAMUSCULAR | Status: DC

## 2013-11-01 LAB — PTH, INTACT AND CALCIUM: Calcium, Total (PTH): 9 mg/dL (ref 8.4–10.5)

## 2013-11-05 ENCOUNTER — Encounter (HOSPITAL_COMMUNITY): Payer: Self-pay | Admitting: Emergency Medicine

## 2013-11-05 DIAGNOSIS — E876 Hypokalemia: Secondary | ICD-10-CM | POA: Diagnosis present

## 2013-11-05 DIAGNOSIS — Z794 Long term (current) use of insulin: Secondary | ICD-10-CM

## 2013-11-05 DIAGNOSIS — K219 Gastro-esophageal reflux disease without esophagitis: Secondary | ICD-10-CM | POA: Diagnosis present

## 2013-11-05 DIAGNOSIS — Z79899 Other long term (current) drug therapy: Secondary | ICD-10-CM

## 2013-11-05 DIAGNOSIS — M109 Gout, unspecified: Secondary | ICD-10-CM | POA: Diagnosis present

## 2013-11-05 DIAGNOSIS — J4489 Other specified chronic obstructive pulmonary disease: Secondary | ICD-10-CM | POA: Diagnosis present

## 2013-11-05 DIAGNOSIS — IMO0002 Reserved for concepts with insufficient information to code with codable children: Principal | ICD-10-CM | POA: Diagnosis present

## 2013-11-05 DIAGNOSIS — J449 Chronic obstructive pulmonary disease, unspecified: Secondary | ICD-10-CM | POA: Diagnosis present

## 2013-11-05 DIAGNOSIS — F329 Major depressive disorder, single episode, unspecified: Secondary | ICD-10-CM | POA: Diagnosis present

## 2013-11-05 DIAGNOSIS — Y849 Medical procedure, unspecified as the cause of abnormal reaction of the patient, or of later complication, without mention of misadventure at the time of the procedure: Secondary | ICD-10-CM | POA: Diagnosis present

## 2013-11-05 DIAGNOSIS — I509 Heart failure, unspecified: Secondary | ICD-10-CM | POA: Diagnosis present

## 2013-11-05 DIAGNOSIS — I5032 Chronic diastolic (congestive) heart failure: Secondary | ICD-10-CM | POA: Diagnosis present

## 2013-11-05 DIAGNOSIS — Z87891 Personal history of nicotine dependence: Secondary | ICD-10-CM

## 2013-11-05 DIAGNOSIS — E119 Type 2 diabetes mellitus without complications: Secondary | ICD-10-CM | POA: Diagnosis present

## 2013-11-05 DIAGNOSIS — N185 Chronic kidney disease, stage 5: Secondary | ICD-10-CM | POA: Diagnosis present

## 2013-11-05 DIAGNOSIS — Z9981 Dependence on supplemental oxygen: Secondary | ICD-10-CM

## 2013-11-05 DIAGNOSIS — D62 Acute posthemorrhagic anemia: Secondary | ICD-10-CM | POA: Diagnosis present

## 2013-11-05 DIAGNOSIS — F3289 Other specified depressive episodes: Secondary | ICD-10-CM | POA: Diagnosis present

## 2013-11-05 DIAGNOSIS — I120 Hypertensive chronic kidney disease with stage 5 chronic kidney disease or end stage renal disease: Secondary | ICD-10-CM | POA: Diagnosis present

## 2013-11-05 DIAGNOSIS — K921 Melena: Secondary | ICD-10-CM | POA: Diagnosis present

## 2013-11-05 DIAGNOSIS — E785 Hyperlipidemia, unspecified: Secondary | ICD-10-CM | POA: Diagnosis present

## 2013-11-05 NOTE — ED Notes (Addendum)
Pt states she is having BM's that are bight red blood.  Pt states first episode around 2230, and 5 BM's since.  Pt reports colonoscopy 8 days prior

## 2013-11-06 ENCOUNTER — Encounter (HOSPITAL_COMMUNITY): Payer: Self-pay | Admitting: Internal Medicine

## 2013-11-06 ENCOUNTER — Inpatient Hospital Stay (HOSPITAL_COMMUNITY)
Admission: EM | Admit: 2013-11-06 | Discharge: 2013-11-07 | DRG: 920 | Disposition: A | Discharge status: Home / Self Care | Payer: Medicare Other | Attending: Internal Medicine | Admitting: Internal Medicine

## 2013-11-06 DIAGNOSIS — J449 Chronic obstructive pulmonary disease, unspecified: Secondary | ICD-10-CM

## 2013-11-06 DIAGNOSIS — K219 Gastro-esophageal reflux disease without esophagitis: Secondary | ICD-10-CM

## 2013-11-06 DIAGNOSIS — K922 Gastrointestinal hemorrhage, unspecified: Secondary | ICD-10-CM

## 2013-11-06 DIAGNOSIS — E663 Overweight: Secondary | ICD-10-CM

## 2013-11-06 DIAGNOSIS — Z860100 Personal history of colon polyps, unspecified: Secondary | ICD-10-CM

## 2013-11-06 DIAGNOSIS — J309 Allergic rhinitis, unspecified: Secondary | ICD-10-CM

## 2013-11-06 DIAGNOSIS — IMO0002 Reserved for concepts with insufficient information to code with codable children: Secondary | ICD-10-CM

## 2013-11-06 DIAGNOSIS — F325 Major depressive disorder, single episode, in full remission: Secondary | ICD-10-CM | POA: Diagnosis present

## 2013-11-06 DIAGNOSIS — N259 Disorder resulting from impaired renal tubular function, unspecified: Secondary | ICD-10-CM

## 2013-11-06 DIAGNOSIS — I498 Other specified cardiac arrhythmias: Secondary | ICD-10-CM

## 2013-11-06 DIAGNOSIS — R05 Cough: Secondary | ICD-10-CM

## 2013-11-06 DIAGNOSIS — K625 Hemorrhage of anus and rectum: Secondary | ICD-10-CM

## 2013-11-06 DIAGNOSIS — R0602 Shortness of breath: Secondary | ICD-10-CM

## 2013-11-06 DIAGNOSIS — I10 Essential (primary) hypertension: Secondary | ICD-10-CM | POA: Diagnosis present

## 2013-11-06 DIAGNOSIS — D62 Acute posthemorrhagic anemia: Secondary | ICD-10-CM

## 2013-11-06 DIAGNOSIS — N184 Chronic kidney disease, stage 4 (severe): Secondary | ICD-10-CM | POA: Diagnosis present

## 2013-11-06 DIAGNOSIS — J4489 Other specified chronic obstructive pulmonary disease: Secondary | ICD-10-CM

## 2013-11-06 DIAGNOSIS — E785 Hyperlipidemia, unspecified: Secondary | ICD-10-CM

## 2013-11-06 DIAGNOSIS — N189 Chronic kidney disease, unspecified: Secondary | ICD-10-CM

## 2013-11-06 DIAGNOSIS — F329 Major depressive disorder, single episode, unspecified: Secondary | ICD-10-CM

## 2013-11-06 DIAGNOSIS — F3289 Other specified depressive episodes: Secondary | ICD-10-CM

## 2013-11-06 DIAGNOSIS — R059 Cough, unspecified: Secondary | ICD-10-CM

## 2013-11-06 DIAGNOSIS — I5032 Chronic diastolic (congestive) heart failure: Secondary | ICD-10-CM

## 2013-11-06 HISTORY — DX: Hyperparathyroidism, unspecified: E21.3

## 2013-11-06 HISTORY — DX: Polyp of colon: K63.5

## 2013-11-06 LAB — ABO/RH: ABO/RH(D): A POS

## 2013-11-06 LAB — CBC
HCT: 35 % — ABNORMAL LOW (ref 36.0–46.0)
HCT: 35.3 % — ABNORMAL LOW (ref 36.0–46.0)
Hemoglobin: 11.2 g/dL — ABNORMAL LOW (ref 12.0–15.0)
Hemoglobin: 11.3 g/dL — ABNORMAL LOW (ref 12.0–15.0)
MCH: 29 pg (ref 26.0–34.0)
MCH: 29.2 pg (ref 26.0–34.0)
MCHC: 32 g/dL (ref 30.0–36.0)
MCHC: 32 g/dL (ref 30.0–36.0)
MCV: 91.3 fL (ref 78.0–100.0)
Platelets: 158 10*3/uL (ref 150–400)
Platelets: 183 10*3/uL (ref 150–400)
RBC: 3.86 MIL/uL — ABNORMAL LOW (ref 3.87–5.11)
RDW: 16.5 % — ABNORMAL HIGH (ref 11.5–15.5)
RDW: 16.5 % — ABNORMAL HIGH (ref 11.5–15.5)
WBC: 6.7 10*3/uL (ref 4.0–10.5)

## 2013-11-06 LAB — COMPREHENSIVE METABOLIC PANEL
ALT: 20 U/L (ref 0–35)
Albumin: 3 g/dL — ABNORMAL LOW (ref 3.5–5.2)
Alkaline Phosphatase: 83 U/L (ref 39–117)
BUN: 81 mg/dL — ABNORMAL HIGH (ref 6–23)
CO2: 26 mEq/L (ref 19–32)
Calcium: 8.9 mg/dL (ref 8.4–10.5)
Creatinine, Ser: 3.85 mg/dL — ABNORMAL HIGH (ref 0.50–1.10)
GFR calc non Af Amer: 11 mL/min — ABNORMAL LOW (ref >90)
Glucose, Bld: 430 mg/dL — ABNORMAL HIGH (ref 70–99)
Potassium: 3.4 mEq/L — ABNORMAL LOW (ref 3.5–5.1)
Total Protein: 6.4 g/dL (ref 6.0–8.3)

## 2013-11-06 LAB — GLUCOSE, CAPILLARY
Glucose-Capillary: 139 mg/dL — ABNORMAL HIGH (ref 70–99)
Glucose-Capillary: 39 mg/dL — CL (ref 70–99)
Glucose-Capillary: 138 mg/dL — ABNORMAL HIGH (ref 70–99)
Glucose-Capillary: 124 mg/dL — ABNORMAL HIGH (ref 70–99)

## 2013-11-06 LAB — TYPE AND SCREEN
ABO/RH(D): A POS
Antibody Screen: NEGATIVE

## 2013-11-06 LAB — BASIC METABOLIC PANEL
CO2: 30 mEq/L (ref 19–32)
Creatinine, Ser: 3.73 mg/dL — ABNORMAL HIGH (ref 0.50–1.10)
GFR calc non Af Amer: 12 mL/min — ABNORMAL LOW (ref >90)
Glucose, Bld: 56 mg/dL — ABNORMAL LOW (ref 70–99)
Potassium: 2.9 mEq/L — ABNORMAL LOW (ref 3.5–5.1)
Sodium: 142 mEq/L (ref 135–145)

## 2013-11-06 MED ORDER — INSULIN ASPART 100 UNIT/ML ~~LOC~~ SOLN
0.0000 [IU] | Freq: Three times a day (TID) | SUBCUTANEOUS | Status: DC
  Administered 2013-11-06: 2 [IU] via SUBCUTANEOUS

## 2013-11-06 MED ORDER — FLUTICASONE PROPIONATE 50 MCG/ACT NA SUSP
1.0000 | Freq: Two times a day (BID) | NASAL | Status: DC
  Administered 2013-11-06 – 2013-11-07 (×3): 1 via NASAL
  Filled 2013-11-06: qty 16

## 2013-11-06 MED ORDER — VITAMIN D 1000 UNITS PO TABS
4000.0000 [IU] | ORAL_TABLET | Freq: Every day | ORAL | Status: DC
  Administered 2013-11-07: 4000 [IU] via ORAL
  Filled 2013-11-06 (×2): qty 4

## 2013-11-06 MED ORDER — INSULIN GLARGINE 100 UNIT/ML ~~LOC~~ SOLN
8.0000 [IU] | Freq: Two times a day (BID) | SUBCUTANEOUS | Status: DC
  Administered 2013-11-06: 23:00:00 8 [IU] via SUBCUTANEOUS
  Filled 2013-11-06 (×3): qty 0.08

## 2013-11-06 MED ORDER — FUROSEMIDE 80 MG PO TABS
160.0000 mg | ORAL_TABLET | Freq: Two times a day (BID) | ORAL | Status: DC
  Administered 2013-11-06 – 2013-11-07 (×3): 160 mg via ORAL
  Filled 2013-11-06 (×4): qty 2

## 2013-11-06 MED ORDER — COLCHICINE 0.6 MG PO TABS
0.6000 mg | ORAL_TABLET | Freq: Every day | ORAL | Status: DC
  Administered 2013-11-06 – 2013-11-07 (×2): 0.6 mg via ORAL
  Filled 2013-11-06 (×2): qty 1

## 2013-11-06 MED ORDER — CALCIUM ACETATE 667 MG PO CAPS
1334.0000 mg | ORAL_CAPSULE | Freq: Three times a day (TID) | ORAL | Status: DC
  Administered 2013-11-06 – 2013-11-07 (×4): 1334 mg via ORAL
  Filled 2013-11-06 (×7): qty 2

## 2013-11-06 MED ORDER — HYDROCODONE-ACETAMINOPHEN 5-325 MG PO TABS: 1.0000 | ORAL_TABLET | ORAL | Status: DC | PRN

## 2013-11-06 MED ORDER — ALBUTEROL SULFATE HFA 108 (90 BASE) MCG/ACT IN AERS
1.0000 | INHALATION_SPRAY | Freq: Four times a day (QID) | RESPIRATORY_TRACT | Status: DC | PRN
  Filled 2013-11-06: qty 6.7

## 2013-11-06 MED ORDER — ALLOPURINOL 300 MG PO TABS
300.0000 mg | ORAL_TABLET | Freq: Every day | ORAL | Status: DC
  Administered 2013-11-06 – 2013-11-07 (×2): 300 mg via ORAL
  Filled 2013-11-06 (×3): qty 1

## 2013-11-06 MED ORDER — ONDANSETRON HCL 4 MG/2ML IJ SOLN: 4.0000 mg | Freq: Four times a day (QID) | INTRAMUSCULAR | Status: DC | PRN

## 2013-11-06 MED ORDER — CLONIDINE HCL 0.3 MG PO TABS
0.3000 mg | ORAL_TABLET | Freq: Two times a day (BID) | ORAL | Status: DC
  Administered 2013-11-06 – 2013-11-07 (×3): 0.3 mg via ORAL
  Filled 2013-11-06 (×4): qty 1

## 2013-11-06 MED ORDER — ONDANSETRON HCL 4 MG PO TABS: 4.0000 mg | ORAL_TABLET | Freq: Four times a day (QID) | ORAL | Status: DC | PRN

## 2013-11-06 MED ORDER — FLUTICASONE PROPIONATE 50 MCG/ACT NA SUSP
1.0000 | Freq: Every day | NASAL | Status: DC | PRN
  Filled 2013-11-06: qty 16

## 2013-11-06 MED ORDER — TRAMADOL HCL 50 MG PO TABS: 50.0000 mg | ORAL_TABLET | ORAL | Status: DC | PRN

## 2013-11-06 MED ORDER — PARICALCITOL 1 MCG PO CAPS
1.0000 ug | ORAL_CAPSULE | ORAL | Status: DC
  Administered 2013-11-06: 11:00:00 1 ug via ORAL
  Filled 2013-11-06: qty 1

## 2013-11-06 MED ORDER — ATORVASTATIN CALCIUM 40 MG PO TABS
40.0000 mg | ORAL_TABLET | Freq: Every day | ORAL | Status: DC
  Administered 2013-11-07: 40 mg via ORAL
  Filled 2013-11-06 (×3): qty 1

## 2013-11-06 MED ORDER — FAMOTIDINE 40 MG PO TABS
40.0000 mg | ORAL_TABLET | Freq: Every day | ORAL | Status: DC
  Administered 2013-11-06: 22:00:00 40 mg via ORAL
  Filled 2013-11-06 (×3): qty 1

## 2013-11-06 MED ORDER — INSULIN ASPART 100 UNIT/ML ~~LOC~~ SOLN: 0.0000 [IU] | Freq: Every day | SUBCUTANEOUS | Status: DC

## 2013-11-06 MED ORDER — VITAMIN B-12 1000 MCG PO TABS
1000.0000 ug | ORAL_TABLET | Freq: Every day | ORAL | Status: DC
  Administered 2013-11-06: 22:00:00 1000 ug via ORAL
  Filled 2013-11-06 (×2): qty 1

## 2013-11-06 MED ORDER — PREGABALIN 50 MG PO CAPS: 50.0000 mg | ORAL_CAPSULE | Freq: Every evening | ORAL | Status: DC | PRN

## 2013-11-06 MED ORDER — LORATADINE 10 MG PO TABS
10.0000 mg | ORAL_TABLET | Freq: Every day | ORAL | Status: DC
  Administered 2013-11-06 – 2013-11-07 (×2): 10 mg via ORAL
  Filled 2013-11-06 (×2): qty 1

## 2013-11-06 MED ORDER — INSULIN ASPART 100 UNIT/ML ~~LOC~~ SOLN: 0.0000 [IU] | Freq: Three times a day (TID) | SUBCUTANEOUS | Status: DC

## 2013-11-06 MED ORDER — BENZONATATE 100 MG PO CAPS
200.0000 mg | ORAL_CAPSULE | Freq: Four times a day (QID) | ORAL | Status: DC | PRN
  Filled 2013-11-06: qty 2

## 2013-11-06 MED ORDER — LEVOTHYROXINE SODIUM 50 MCG PO TABS
50.0000 ug | ORAL_TABLET | Freq: Every day | ORAL | Status: DC
  Administered 2013-11-06 – 2013-11-07 (×2): 50 ug via ORAL
  Filled 2013-11-06 (×3): qty 1

## 2013-11-06 MED ORDER — PANTOPRAZOLE SODIUM 40 MG IV SOLR
40.0000 mg | Freq: Two times a day (BID) | INTRAVENOUS | Status: DC
  Administered 2013-11-06: 40 mg via INTRAVENOUS
  Filled 2013-11-06 (×2): qty 40

## 2013-11-06 MED ORDER — METOLAZONE 5 MG PO TABS
5.0000 mg | ORAL_TABLET | ORAL | Status: DC
  Administered 2013-11-06: 5 mg via ORAL
  Filled 2013-11-06: qty 1

## 2013-11-06 MED ORDER — ACETAMINOPHEN 650 MG RE SUPP: 650.0000 mg | Freq: Four times a day (QID) | RECTAL | Status: DC | PRN

## 2013-11-06 MED ORDER — ACETAMINOPHEN 325 MG PO TABS
650.0000 mg | ORAL_TABLET | Freq: Four times a day (QID) | ORAL | Status: DC | PRN
  Administered 2013-11-07: 650 mg via ORAL
  Filled 2013-11-06: qty 2

## 2013-11-06 MED ORDER — CINACALCET HCL 30 MG PO TABS
30.0000 mg | ORAL_TABLET | Freq: Every day | ORAL | Status: DC
  Administered 2013-11-07: 08:00:00 30 mg via ORAL
  Filled 2013-11-06 (×3): qty 1

## 2013-11-06 MED ORDER — INSULIN ASPART 100 UNIT/ML ~~LOC~~ SOLN
0.0000 [IU] | SUBCUTANEOUS | Status: DC
  Administered 2013-11-07 (×2): 2 [IU] via SUBCUTANEOUS

## 2013-11-06 MED ORDER — POTASSIUM CHLORIDE CRYS ER 20 MEQ PO TBCR
40.0000 meq | EXTENDED_RELEASE_TABLET | Freq: Three times a day (TID) | ORAL | Status: DC
  Administered 2013-11-06 – 2013-11-07 (×4): 40 meq via ORAL
  Filled 2013-11-06 (×6): qty 2

## 2013-11-06 MED ORDER — CITALOPRAM HYDROBROMIDE 40 MG PO TABS
40.0000 mg | ORAL_TABLET | Freq: Every day | ORAL | Status: DC
  Administered 2013-11-06 – 2013-11-07 (×2): 40 mg via ORAL
  Filled 2013-11-06 (×2): qty 1

## 2013-11-06 MED ORDER — SODIUM CHLORIDE 0.9 % IV SOLN
INTRAVENOUS | Status: DC
  Administered 2013-11-06: 03:00:00 via INTRAVENOUS

## 2013-11-06 MED ORDER — DEXTROSE 50 % IV SOLN
50.0000 mL | Freq: Once | INTRAVENOUS | Status: AC | PRN
  Administered 2013-11-06: 09:00:00 50 mL via INTRAVENOUS
  Filled 2013-11-06: qty 50

## 2013-11-06 MED ORDER — INSULIN GLARGINE 100 UNIT/ML ~~LOC~~ SOLN
20.0000 [IU] | Freq: Two times a day (BID) | SUBCUTANEOUS | Status: DC
  Filled 2013-11-06 (×2): qty 0.2

## 2013-11-06 NOTE — Progress Notes (Signed)
Hypoglycemic Event  CBG: 39  Treatment: D50 IV 50 mL  Symptoms: None  Follow-up CBG: Time:9:03 CBG Result:138  Possible Reasons for Event: Inadequate meal intake  Comments/MD notified:    Al Decant  Remember to initiate Hypoglycemia Order Set & complete

## 2013-11-06 NOTE — Consult Note (Signed)
Patient seen, examined, and I agree with the above documentation, including the assessment and plan. Likely post-polypectomy bleed, which has not recurred since admission Hemoglobin relatively stable, continue to trend Patient wants to avoid repeat colonoscopy at all possible Given the lack of overt bleeding today, will hold off on colonoscopy prep and repeat colonoscopy at this time Clear liquids the rest of the day, and if no further bleeding can advance diet tomorrow morning She understands that if there is overt rebleeding, my recommendation would be for repeat colonoscopy

## 2013-11-06 NOTE — Consult Note (Signed)
[facility] Consult: 10:09 AM 11/06/2013  LOS: 0 days    Referring Provider: Dr  Betti Cruz Primary Care Physician:  Nadean Corwin, MD Primary Gastroenterologist:  Dr. Arlyce Dice    Reason for Consultation:  Hematochezia.    HPI: [name] is a 67 y.o. female.  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  Hx colon polyposis and polypectomies on multiple colonoscopies since 2005.   Sept 2014 colonoscopy with multiple polypectomies and incomplete removal of polyp at hepatic flexure. Pathology returned with non-dysplastic TVA in the hepatic flexure polyp.  Other polyps read as tubular adenoma and sessile serrated adenoma.   Underwent follow up colonoscopy 10/28/13 with biopsy of sessile polyp in ascending colon; removal ,using saline lifting technique in some cases,of multiple other polyps. APC fulguration of some of the polyp remnants.   ~10 PM on 12/16 developed painless hematochezia. Hgb 11.3 c/w 13.7 on 11/24 and 12.7 on 12/11.  This AM Hgb stable at 11.2. Had about 7 total episodes of medium amounts of blood, this stopped after about midnight.   She takes low dose ASA. No NSAIDs.   No chest or abdominal pain.  No nausea.  No dizziness.  Fells well and hungry today.      Past Medical History  Diagnosis Date  . Type II or unspecified type diabetes mellitus without mention of complication, not stated as uncontrolled   . Esophageal reflux   . DJD (degenerative joint disease)   . Depressive disorder, not elsewhere classified   . Unspecified essential hypertension   . Chronic cough   . CHF (congestive heart failure)     with well preserved EF   . HTN (hypertension)   . HLD (hyperlipidemia)   . Anxiety   . Anemia   . Shortness of breath   . COPD  (chronic obstructive pulmonary disease)     mild-" oxygen use 2 l/m nightly"  . Renal insufficiency     function is stable at present-Dr. Hyman Hopes follows    Past Surgical History  Procedure Laterality Date  . Insertion of dialysis catheter Left 10-03-13    3 yrs ago -Left upper arm AVGG  . Vascular surgery Left 2011    arm  . Colonoscopy w/ polypectomy  10-02-13    hx. past 6 months -2 removed  . Colonoscopy N/A 10/28/2013    Procedure: COLONOSCOPY;  Surgeon: Louis Meckel, MD;  Location: [facility];  Service: Endoscopy;  Laterality: N/A;  . Hot hemostasis N/A 10/28/2013    Procedure: HOT HEMOSTASIS (ARGON PLASMA COAGULATION/BICAP);  Surgeon: Louis Meckel, MD;  Location: [facility];  Service: Endoscopy;  Laterality: N/A;    Prior to Admission medications   Medication Sig Start Date End Date Taking? Authorizing Provider  albuterol (PROVENTIL HFA;VENTOLIN HFA) 108 (90 BASE) MCG/ACT inhaler Inhale 1-2 puffs into the lungs every 6 (six) hours as needed for wheezing or shortness of breath.   Yes Historical Provider, MD  allopurinol (ZYLOPRIM) 300 MG tablet Take 300 mg by mouth daily with  breakfast.    Yes Historical Provider, MD  aspirin 81 MG tablet Take 81 mg by mouth daily.    Yes Historical Provider, MD  atorvastatin (LIPITOR) 40 MG tablet Take 40 mg by mouth daily with breakfast.   Yes Historical Provider, MD  calcium acetate (PHOSLO) 667 MG capsule Take 1,334 mg by mouth 3 (three) times daily with meals.  02/09/11  Yes Historical Provider, MD  cetirizine (ZYRTEC) 10 MG tablet Take 10 mg by mouth daily.   Yes Historical Provider, MD  Cholecalciferol 1000 UNITS tablet Take 4,000 Units by mouth daily.   Yes Historical Provider, MD  cinacalcet (SENSIPAR) 30 MG tablet Take 30 mg by mouth daily.   Yes Historical Provider, MD  citalopram (CELEXA) 40 MG tablet take 1 tablet by mouth once daily for MOOD 10/23/13  Yes Lucky Cowboy, MD  cloNIDine (CATAPRES) 0.3 MG tablet Take 0.3 mg by  mouth 2 (two) times daily. 12/15/11  Yes Bernadene Person, NP  colchicine 0.6 MG tablet Take 0.6 mg by mouth daily.   Yes Historical Provider, MD  fluticasone (FLONASE) 50 MCG/ACT nasal spray Place 1 spray into both nostrils 2 (two) times daily.   Yes Historical Provider, MD  fluticasone (FLONASE) 50 MCG/ACT nasal spray Place 1 spray into both nostrils daily as needed for allergies or rhinitis.   Yes Historical Provider, MD  furosemide (LASIX) 80 MG tablet Take 160 mg by mouth 2 (two) times daily.    Yes Historical Provider, MD  insulin glargine (LANTUS) 100 UNIT/ML injection Inject 40-60 Units into the skin 2 (two) times daily. 60 units in the morning and 40 units at bedtime   Yes Bernadene Person, NP  insulin lispro (HUMALOG) 100 UNIT/ML injection Inject 20 Units into the skin 2 (two) times daily with a meal.    Yes Historical Provider, MD  levothyroxine (SYNTHROID, LEVOTHROID) 50 MCG tablet Take 50 mcg by mouth daily before breakfast.    Yes Historical Provider, MD  metolazone (ZAROXOLYN) 5 MG tablet Take 5 mg by mouth every Monday, Wednesday, and Friday.    Yes Historical Provider, MD  paricalcitol (ZEMPLAR) 1 MCG capsule Take 1 mcg by mouth every Monday, Wednesday, and Friday. 12/15/11  Yes Bernadene Person, NP  potassium chloride SA (K-DUR,KLOR-CON) 20 MEQ tablet Take 40 mEq by mouth 3 (three) times daily.  12/15/11  Yes Bernadene Person, NP  pregabalin (LYRICA) 50 MG capsule Take 50 mg by mouth at bedtime as needed (nerve pain).    Yes Historical Provider, MD  ranitidine (ZANTAC) 300 MG tablet One at bedtime 02/08/13  Yes Nyoka Cowden, MD  traMADol (ULTRAM) 50 MG tablet Take 50 mg by mouth every 4 (four) hours as needed for moderate pain or severe pain.    Yes Historical Provider, MD  vitamin B-12 (CYANOCOBALAMIN) 1000 MCG tablet Take 1,000 mcg by mouth at bedtime.    Yes Historical Provider, MD  albuterol (PROVENTIL HFA;VENTOLIN HFA) 108 (90 BASE) MCG/ACT inhaler Inhale 2 puffs  into the lungs every 6 (six) hours as needed. For wheezing 08/23/11 08/28/13  Leslye Peer, MD  benzonatate (TESSALON) 200 MG capsule Take 200 mg by mouth every 6 (six) hours as needed for cough. 06/03/13   Leslye Peer, MD  HYDROcodone-acetaminophen (NORCO/VICODIN) 5-325 MG per tablet Take 1 tablet by mouth every 4 (four) hours as needed for moderate pain or severe pain. 1/2-1 every 3-4 hours as needed for cough and pain    Historical Provider, MD  Scheduled Meds: . allopurinol  300 mg Oral Q breakfast  . atorvastatin  40 mg Oral Q breakfast  . calcium acetate  1,334 mg Oral TID WC  . cholecalciferol  4,000 Units Oral Daily  . cinacalcet  30 mg Oral Q breakfast  . citalopram  40 mg Oral Daily  . cloNIDine  0.3 mg Oral BID  . colchicine  0.6 mg Oral Daily  . fluticasone  1 spray Each Nare BID  . furosemide  160 mg Oral BID  . insulin aspart  0-20 Units Subcutaneous TID WC  . insulin aspart  0-5 Units Subcutaneous QHS  . insulin glargine  20 Units Subcutaneous BID  . levothyroxine  50 mcg Oral QAC breakfast  . loratadine  10 mg Oral Daily  . metolazone  5 mg Oral Q M,W,F  . pantoprazole (PROTONIX) IV  40 mg Intravenous BID  . paricalcitol  1 mcg Oral Q M,W,F  . potassium chloride SA  40 mEq Oral TID  . vitamin B-12  1,000 mcg Oral QHS   Infusions:   PRN Meds: acetaminophen, acetaminophen, albuterol, benzonatate, HYDROcodone-acetaminophen, ondansetron (ZOFRAN) IV, ondansetron, pregabalin, traMADol   Allergies as of 11/05/2013 - Review Complete 11/05/2013  Allergen Reaction Noted  . Codeine Hives and Itching 02/04/2009    Family History  Problem Relation Age of Onset  . Heart disease Father   . Cancer Sister   . Hypertension    . Diabetes    . Colon cancer Neg Hx     History   Social History  . Marital Status: Married    Spouse Name: N/A    Number of Children: N/A  . Years of Education: N/A    Social History Main Topics  . Smoking status: Former Smoker -- 1.00  packs/day for 30 years    Types: Cigarettes    Quit date: 11/22/2007  . Smokeless tobacco: Never Used     Comment: 1ppd x 30 years  . Alcohol Use: No  . Drug Use: No  . Sexual Activity: No    REVIEW OF SYSTEMS: Constitutional:  About 13 # weight loss in last 3 months.  Watching her diet and no anorexia.  ENT:  No nose bleeds.  + sinus congestion Pulm:  + cough, mostly non-productive CV:  No palpitations, no LE edema.  GU:  No hematuria, no frequency GI:  No dysphagia, no heartburn.  Used Nexium in past Heme:  No po iron therapy    Transfusions:  None ever Neuro:  No headaches, no peripheral tingling or numbness Derm:  No itching, no rash or sores.  Endocrine:  No sweats or chills.  No polyuria or dysuria.  Sugars range from 110 to low 200s.  Immunization:  Flu shot in 08/2013 Travel:  None beyond local counties in last few months.    PHYSICAL EXAM: Vital signs in last 24 hours: Filed Vitals:   11/06/13 0450  BP: 178/77  Pulse: 72  Temp: 97.6 F (36.4 C)  Resp: 20   Wt Readings from Last 3 Encounters:  11/06/13 83.7 kg (184 lb 8.4 oz)  10/21/13 85.004 kg (187 lb 6.4 oz)  08/19/13 89.359 kg (197 lb)    General: Very pleasant, comfortable and well-appearing AAF.  No distress  Head:  Atraumatic, no asymmetry or facial swelling.  Eyes:  No icterus or pallor Ears:  Not HOH  Nose:  + congestion, no drainage or sneezing Mouth:  Moist, pink oral MM.  Upper full and lower partial dentures Neck:  No JVD, no mass, no TMG Lungs:  Clear bil.  No cough or dyspnea Heart: RRR.  No MRG Abdomen:  Soft, NT, ND, no mass or HSM.  No bruits.  BS active.   Rectal: not repeated.  Red blood on exam in ED last night.    Musc/Skeltl: some contracture in toes, tophi in right hallux.  No joint tenderness. Extremities:  No CCE.  Fistula with thrill in upper left arm.  Neurologic:  Not confused.  No tremor, no limb weakness.  No gross deficits.  Good historian. Fully alert Skin:  No rash.   Corn on right 2nd toe. Tattoos:  none Nodes:  No cervical adenopathy.   Psych:  Pleasant, engaged, relaxed.   Intake/Output from previous day:   Intake/Output this shift:    LAB RESULTS:  Recent Labs  11/05/13 2340 11/06/13 0630  WBC 6.1 6.3  HGB 11.3* 11.2*  HCT 35.3* 35.0*  PLT 169 158   BMET Lab Results  Component Value Date   NA 142 11/06/2013   NA 135 11/05/2013   NA 139 10/31/2013   K 2.9* 11/06/2013   K 3.4* 11/05/2013   K 4.0 10/31/2013   CL 101 11/06/2013   CL 95* 11/05/2013   CL 99 10/31/2013   CO2 30 11/06/2013   CO2 26 11/05/2013   CO2 29 10/31/2013   GLUCOSE 56* 11/06/2013   GLUCOSE 430* 11/05/2013   GLUCOSE 277* 10/31/2013   BUN 81* 11/06/2013   BUN 81* 11/05/2013   BUN 58* 10/31/2013   CREATININE 3.73* 11/06/2013   CREATININE 3.85* 11/05/2013   CREATININE 4.19* 10/31/2013   CALCIUM 9.2 11/06/2013   CALCIUM 8.9 11/05/2013   CALCIUM 9.2 10/31/2013   CALCIUM 9.0 10/31/2013   LFT  Recent Labs  11/05/13 2340  PROT 6.4  ALBUMIN 3.0*  AST 21  ALT 20  ALKPHOS 83  BILITOT 0.2*   PT/INR Lab Results  Component Value Date   INR 1.20 12/11/2011   INR 1.0 09/24/2008   INR 0.9 09/21/2008    ENDOSCOPIC STUDIES: 10/28/13  Colonoscopy  Dr Arlyce Dice Indications: Patient's personal history of adenomatous colon polyps 2005, 2008, 2014. Colonoscopy in September, 2014 demonstrated multiple descending colon polyps which were removed. A large polyp  in the hepatic flexure was incompletely removed. COLON FINDINGS: A smooth sessile polyp measuring 4 mm in size was  found in the ascending colon. The overlying mucosa was smooth and  slightly reddened. I attempted to inject the base with normal  saline but no lifting was observed. It was unclear whether this  was a polyp remnant or simply a scar. Biopsies only were taken.  Two sessile polyps were found at the hepatic flexure. These polyps  were located in the area that was previously marked with [place] ink.    The smaller polyp measured approximately 10 mm and the larger  polyp, which was partially removed, measured approximately 15 mm.  The first polyp was injected with approximately 6 cc normal saline  submucosally and then removed piecemeal. Polyp remnants were  fulgurated utilizing the argon plasma coagulator. The larger  polyp, which was previously partially removed, was injected  submucosally with methylene blue impregnated normal saline with  approximately 9 cc. The polyp was then removed with polypectomy  snare utilizing cautery, piecemeal. Polyp remnants were fulgurated  utilizing the argon plasma coagulator. Polyps were retrieved for  pathology. A sessile polyp measuring 3 mm in size was found in the  descending colon. A polypectomy was performed  with a cold snare.  The resection was complete and the polyp tissue was not retrieved.  The time to cecum= . Withdrawal time=minutes 0 seconds. The  scope was withdrawn and the procedure completed.  COMPLICATIONS: There were no complications.  ENDOSCOPIC IMPRESSION:  Colonic polyposis Pathology 1. Colon, polyp(s), ascending; r/o neoplasia - HYPERPLASTIC POLYP(S). NO ADENOMATOUS CHANGE OR MALIGNANCY IDENTIFIED. 2. Colon, polyp(s), hepatic flexure; r/o neoplasia - TUBULAR ADENOMA WITH FOCAL HIGH GRADE DYSPLASIA (LARGEST FRAGMENT 1.2 CM). - APPRECIABLE RESECTION MARGIN, NEGATIVE FOR DYSPLASIA OR MALIGNANCY.    IMPRESSION:   *  Post polypectomy LGI bleed.  Per Dr Arlyce Dice this is most likely from the polypectomy site at hepatic flaxure.   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    *  Insulin dependent type 2 DM  *  Hypokalemia.     PLAN:     *  Clears (cream in coffee ok for now).  Possible colonoscopy tomorrow but Dr Rhea Belton will decide this.  Pt not eager but also not refusing colonoscopy.  *  BID blood counts.    Jennye Moccasin  11/06/2013, 10:09  AM Pager: [phone]

## 2013-11-06 NOTE — H&P (Signed)
Patient's PCP: Nadean Corwin, MD Patient's gastroenterologist: Dr. Arlyce Dice  Chief Complaint: Rectal bleed  History of Present Illness: [name] is a 68 y.o. African American female this history of type 2 diabetes on insulin, GERD, depression, hypertension, hyperlipidemia, COPD, and chronic kidney disease stage IV/V presents with the above complaints.  Patient reports that she had a colonoscopy on 08/19/2013 and had polypectomy.  She had a repeat colonoscopy on 10/28/2013 for colonic polyposis and had polypectomy at that time.  She reports that she has been doing well at home, however at 10 p.m. last night when she had a bowel movement she noted red blood in her stools.  She presents to the emergency department for further.  While in the emergency department had multiple bowel movements with blood in her stools.  Her hemoglobin was 11.3.  Hospitalist service was asked to admit the patient for further care and management.  She denies any recent fevers, chills, nausea, vomiting, chest pain, shortness of breath, abdominal pain, headaches or vision changes.  Patient on aspirin, denies any NSAID use at home.  Review of Systems: All systems reviewed with the patient and positive as per history of present illness, otherwise all other systems are negative.  Past Medical History  Diagnosis Date  . Type II or unspecified type diabetes mellitus without mention of complication, not stated as uncontrolled   . Esophageal reflux   . DJD (degenerative joint disease)   . Depressive disorder, not elsewhere classified   . Unspecified essential hypertension   . Chronic cough   . CHF (congestive heart failure)     with well preserved EF   . HTN (hypertension)   . HLD (hyperlipidemia)   . Anxiety   . Anemia   . Shortness of breath   . COPD (chronic obstructive pulmonary disease)     mild-" oxygen use 2 l/m nightly"  . Renal insufficiency     function is stable at present-Dr. Hyman Hopes follows   Past  Surgical History  Procedure Laterality Date  . Insertion of dialysis catheter Left 10-03-13    3 yrs ago -Left upper arm AVGG  . Vascular surgery Left 2011    arm  . Colonoscopy w/ polypectomy  10-02-13    hx. past 6 months -2 removed  . Colonoscopy N/A 10/28/2013    Procedure: COLONOSCOPY;  Surgeon: Louis Meckel, MD;  Location: [facility];  Service: Endoscopy;  Laterality: N/A;  . Hot hemostasis N/A 10/28/2013    Procedure: HOT HEMOSTASIS (ARGON PLASMA COAGULATION/BICAP);  Surgeon: Louis Meckel, MD;  Location: [facility];  Service: Endoscopy;  Laterality: N/A;   Family History  Problem Relation Age of Onset  . Heart disease Father   . Cancer Sister   . Hypertension    . Diabetes    . Colon cancer Neg Hx    History   Social History  . Marital Status: Married    Spouse Name: N/A    Number of Children: N/A  . Years of Education: N/A   Occupational History  . Not on file.   Social History Main Topics  . Smoking status: Former Smoker -- 1.00 packs/day for 30 years    Types: Cigarettes    Quit date: 11/22/2007  . Smokeless tobacco: Never Used     Comment: 1ppd x 30 years  . Alcohol Use: No  . Drug Use: No  . Sexual Activity: Not on file   Other Topics Concern  . Not on file   Social  History Narrative  . No narrative on file   Allergies: Codeine  Home Meds: Prior to Admission medications   Medication Sig Start Date End Date Taking? Authorizing Provider  albuterol (PROVENTIL HFA;VENTOLIN HFA) 108 (90 BASE) MCG/ACT inhaler Inhale 1-2 puffs into the lungs every 6 (six) hours as needed for wheezing or shortness of breath.   Yes Historical Provider, MD  allopurinol (ZYLOPRIM) 300 MG tablet Take 300 mg by mouth daily with breakfast.    Yes Historical Provider, MD  aspirin 81 MG tablet Take 81 mg by mouth daily.    Yes Historical Provider, MD  atorvastatin (LIPITOR) 40 MG tablet Take 40 mg by mouth daily with breakfast.   Yes Historical Provider, MD  calcium  acetate (PHOSLO) 667 MG capsule Take 1,334 mg by mouth 3 (three) times daily with meals.  02/09/11  Yes Historical Provider, MD  cetirizine (ZYRTEC) 10 MG tablet Take 10 mg by mouth daily.   Yes Historical Provider, MD  Cholecalciferol 1000 UNITS tablet Take 4,000 Units by mouth daily.   Yes Historical Provider, MD  cinacalcet (SENSIPAR) 30 MG tablet Take 30 mg by mouth daily.   Yes Historical Provider, MD  citalopram (CELEXA) 40 MG tablet take 1 tablet by mouth once daily for MOOD 10/23/13  Yes Lucky Cowboy, MD  cloNIDine (CATAPRES) 0.3 MG tablet Take 0.3 mg by mouth 2 (two) times daily. 12/15/11  Yes Bernadene Person, NP  colchicine 0.6 MG tablet Take 0.6 mg by mouth daily.   Yes Historical Provider, MD  fluticasone (FLONASE) 50 MCG/ACT nasal spray Place 1 spray into both nostrils 2 (two) times daily.   Yes Historical Provider, MD  fluticasone (FLONASE) 50 MCG/ACT nasal spray Place 1 spray into both nostrils daily as needed for allergies or rhinitis.   Yes Historical Provider, MD  furosemide (LASIX) 80 MG tablet Take 160 mg by mouth 2 (two) times daily.    Yes Historical Provider, MD  insulin glargine (LANTUS) 100 UNIT/ML injection Inject 40-60 Units into the skin 2 (two) times daily. 60 units in the morning and 40 units at bedtime   Yes Bernadene Person, NP  insulin lispro (HUMALOG) 100 UNIT/ML injection Inject 20 Units into the skin 2 (two) times daily with a meal.    Yes Historical Provider, MD  levothyroxine (SYNTHROID, LEVOTHROID) 50 MCG tablet Take 50 mcg by mouth daily before breakfast.    Yes Historical Provider, MD  metolazone (ZAROXOLYN) 5 MG tablet Take 5 mg by mouth every Monday, Wednesday, and Friday.    Yes Historical Provider, MD  paricalcitol (ZEMPLAR) 1 MCG capsule Take 1 mcg by mouth every Monday, Wednesday, and Friday. 12/15/11  Yes Bernadene Person, NP  potassium chloride SA (K-DUR,KLOR-CON) 20 MEQ tablet Take 40 mEq by mouth 3 (three) times daily.  12/15/11  Yes  Bernadene Person, NP  pregabalin (LYRICA) 50 MG capsule Take 50 mg by mouth at bedtime as needed (nerve pain).    Yes Historical Provider, MD  ranitidine (ZANTAC) 300 MG tablet One at bedtime 02/08/13  Yes Nyoka Cowden, MD  traMADol (ULTRAM) 50 MG tablet Take 50 mg by mouth every 4 (four) hours as needed for moderate pain or severe pain.    Yes Historical Provider, MD  vitamin B-12 (CYANOCOBALAMIN) 1000 MCG tablet Take 1,000 mcg by mouth at bedtime.    Yes Historical Provider, MD  albuterol (PROVENTIL HFA;VENTOLIN HFA) 108 (90 BASE) MCG/ACT inhaler Inhale 2 puffs into the lungs every 6 (six) hours as  needed. For wheezing 08/23/11 08/28/13  Leslye Peer, MD  benzonatate (TESSALON) 200 MG capsule Take 200 mg by mouth every 6 (six) hours as needed for cough. 06/03/13   Leslye Peer, MD  HYDROcodone-acetaminophen (NORCO/VICODIN) 5-325 MG per tablet Take 1 tablet by mouth every 4 (four) hours as needed for moderate pain or severe pain. 1/2-1 every 3-4 hours as needed for cough and pain    Historical Provider, MD    Physical Exam: Blood pressure 153/66, pulse 69, temperature 98.5 F (36.9 C), temperature source Oral, resp. rate 22, weight 85.276 kg (188 lb), SpO2 99.00%. General: Awake, Oriented x3, No acute distress. HEENT: EOMI, Moist mucous membranes Neck: Supple CV: S1 and S2 Lungs: Clear to ascultation bilaterally Abdomen: Soft, Nontender, Nondistended, +bowel sounds. Ext: Good pulses. Trace edema. No clubbing or cyanosis noted. Neuro: Cranial Nerves II-XII grossly intact. Has 5/5 motor strength in upper and lower extremities.   Lab results:  Recent Labs  11/05/13 2340  NA 135  K 3.4*  CL 95*  CO2 26  GLUCOSE 430*  BUN 81*  CREATININE 3.85*  CALCIUM 8.9    Recent Labs  11/05/13 2340  AST 21  ALT 20  ALKPHOS 83  BILITOT 0.2*  PROT 6.4  ALBUMIN 3.0*   No results found for this basename: LIPASE, AMYLASE,  in the last 72 hours  Recent Labs  11/05/13 2340  WBC 6.1   HGB 11.3*  HCT 35.3*  MCV 91.2  PLT 169   No results found for this basename: CKTOTAL, CKMB, CKMBINDEX, TROPONINI,  in the last 72 hours No components found with this basename: POCBNP,  No results found for this basename: DDIMER,  in the last 72 hours No results found for this basename: HGBA1C,  in the last 72 hours No results found for this basename: CHOL, HDL, LDLCALC, TRIG, CHOLHDL, LDLDIRECT,  in the last 72 hours No results found for this basename: TSH, T4TOTAL, FREET3, T3FREE, THYROIDAB,  in the last 72 hours No results found for this basename: VITAMINB12, FOLATE, FERRITIN, TIBC, IRON, RETICCTPCT,  in the last 72 hours Imaging results:  No results found.  Assessment & Plan by Problem: GI bleed presumed lower GI bleed in origin Start patient on IV PPI twice daily.  Cycle CBC every 8 hours.  Consider blood transfusion if hemoglobin drops below 8.0 or any hemodynamic instability.  Dr. Dierdre Highman spoke with Dr. Arlyce Dice, GI to see the patient in the morning.  Will have the patient n.p.o. with supportive management.  Hold patient's aspirin.  Acute blood loss anemia Management as indicated above.  Hemoglobin stable.  Hypertension Stable.  Continue home antihypertensive medications with hold parameters.  Chronic diastolic heart failure Hold any IV fluids for now.  Patient appears compensated.  Continue home diuretic therapy.  Chronic kidney disease stage IV/V Renal function appears to be at baseline.  Continue to monitor.  Continue home medications including diuretic, paricalcitol, calcium acetate, and cinacalcet.  GERD Started the patient on IV PPI twice daily.  Depression Stable.  Continue home medications.  Hyperlipidemia Continue home medications.  History of gout Continue home medications.  Diabetes Decreased patient's Lantus dose to 20 units twice daily.  Resistant sliding scale insulin.  Prophylaxis SCDs.  No heparin products given concern for bleeding.  CODE  STATUS Full code.  Disposition Admit the patient to MedSurg as inpatient.   Time spent on admission, talking to the patient, and coordinating care was: 50 mins.  Dominik Yordy A, MD 11/06/2013, 4:20 AM

## 2013-11-06 NOTE — ED Provider Notes (Signed)
CSN: [id number]     Arrival date & time 11/05/13  2320 History   First MD Initiated Contact with Patient 11/06/13 0208     Chief Complaint  Patient presents with  . Rectal Bleeding   (Consider location/radiation/quality/duration/timing/severity/associated sxs/prior Treatment) HPI History provided by patient. Rectal bleeding onset tonight. Colonoscopy with polypectomy 8 days ago. Doing fine since then. No abdominal pain. No fevers or chills. No weakness. No dizziness or shortness of breath. Tonight developed multiple episodes of bright red blood per rectum. she has also had some loose bowel movements and dark stool. No vomiting. No known sick contacts. Symptoms moderate severity. Patient describes "a lot" of blood in the toilet Past Medical History  Diagnosis Date  . Type II or unspecified type diabetes mellitus without mention of complication, not stated as uncontrolled   . Esophageal reflux   . DJD (degenerative joint disease)   . Depressive disorder, not elsewhere classified   . Unspecified essential hypertension   . Chronic cough   . CHF (congestive heart failure)     with well preserved EF   . HTN (hypertension)   . HLD (hyperlipidemia)   . Anxiety   . Anemia   . Shortness of breath   . COPD (chronic obstructive pulmonary disease)     mild-" oxygen use 2 l/m nightly"  . Renal insufficiency     function is stable at present-Dr. Hyman Hopes follows   Past Surgical History  Procedure Laterality Date  . Insertion of dialysis catheter Left 10-03-13    3 yrs ago -Left upper arm AVGG  . Vascular surgery Left 2011    arm  . Colonoscopy w/ polypectomy  10-02-13    hx. past 6 months -2 removed  . Colonoscopy N/A 10/28/2013    Procedure: COLONOSCOPY;  Surgeon: Louis Meckel, MD;  Location: [facility];  Service: Endoscopy;  Laterality: N/A;  . Hot hemostasis N/A 10/28/2013    Procedure: HOT HEMOSTASIS (ARGON PLASMA COAGULATION/BICAP);  Surgeon: Louis Meckel, MD;  Location: [facility];  Service: Endoscopy;  Laterality: N/A;   Family History  Problem Relation Age of Onset  . Heart disease Father   . Cancer Sister   . Hypertension    . Diabetes    . Colon cancer Neg Hx    History  Substance Use Topics  . Smoking status: Former Smoker -- 1.00 packs/day for 30 years    Types: Cigarettes    Quit date: 11/22/2007  . Smokeless tobacco: Never Used     Comment: 1ppd x 30 years  . Alcohol Use: No   OB History   Grav Para Term Preterm Abortions TAB SAB Ect Mult Living                 Review of Systems  Constitutional: Negative for fever and chills.  Eyes: Negative for pain.  Respiratory: Negative for shortness of breath.   Cardiovascular: Negative for chest pain.  Gastrointestinal: Positive for blood in stool and anal bleeding. Negative for abdominal pain and rectal pain.  Genitourinary: Negative for dysuria.  Musculoskeletal: Negative for back pain, neck pain and neck stiffness.  Skin: Negative for rash.  Neurological: Negative for headaches.  All other systems reviewed and are negative.    Allergies  Codeine  Home Medications   Current Outpatient Rx  Name  Route  Sig  Dispense  Refill  . albuterol (PROVENTIL HFA;VENTOLIN HFA) 108 (90 BASE) MCG/ACT inhaler   Inhalation   Inhale 1-2 puffs into the lungs  every 6 (six) hours as needed for wheezing or shortness of breath.         . allopurinol (ZYLOPRIM) 300 MG tablet   Oral   Take 300 mg by mouth daily with breakfast.          . aspirin 81 MG tablet   Oral   Take 81 mg by mouth daily.          Marland Kitchen atorvastatin (LIPITOR) 40 MG tablet   Oral   Take 40 mg by mouth daily with breakfast.         . calcium acetate (PHOSLO) 667 MG capsule   Oral   Take 1,334 mg by mouth 3 (three) times daily with meals.          . cetirizine (ZYRTEC) 10 MG tablet   Oral   Take 10 mg by mouth daily.         . Cholecalciferol 1000 UNITS tablet   Oral   Take 4,000 Units by mouth daily.          . cinacalcet (SENSIPAR) 30 MG tablet   Oral   Take 30 mg by mouth daily.         . citalopram (CELEXA) 40 MG tablet      take 1 tablet by mouth once daily for MOOD   90 tablet   1   . cloNIDine (CATAPRES) 0.3 MG tablet   Oral   Take 0.3 mg by mouth 2 (two) times daily.         . colchicine 0.6 MG tablet   Oral   Take 0.6 mg by mouth daily.         . fluticasone (FLONASE) 50 MCG/ACT nasal spray   Each Nare   Place 1 spray into both nostrils 2 (two) times daily.         . fluticasone (FLONASE) 50 MCG/ACT nasal spray   Each Nare   Place 1 spray into both nostrils daily as needed for allergies or rhinitis.         . furosemide (LASIX) 80 MG tablet   Oral   Take 160 mg by mouth 2 (two) times daily.          . insulin glargine (LANTUS) 100 UNIT/ML injection   Subcutaneous   Inject 40-60 Units into the skin 2 (two) times daily. 60 units in the morning and 40 units at bedtime         . insulin lispro (HUMALOG) 100 UNIT/ML injection   Subcutaneous   Inject 20 Units into the skin 2 (two) times daily with a meal.          . levothyroxine (SYNTHROID, LEVOTHROID) 50 MCG tablet   Oral   Take 50 mcg by mouth daily before breakfast.          . metolazone (ZAROXOLYN) 5 MG tablet   Oral   Take 5 mg by mouth every Monday, Wednesday, and Friday.          . paricalcitol (ZEMPLAR) 1 MCG capsule   Oral   Take 1 mcg by mouth every Monday, Wednesday, and Friday.         . potassium chloride SA (K-DUR,KLOR-CON) 20 MEQ tablet   Oral   Take 40 mEq by mouth 3 (three) times daily.          . pregabalin (LYRICA) 50 MG capsule   Oral   Take 50 mg by mouth at bedtime as needed (nerve pain).          Marland Kitchen  ranitidine (ZANTAC) 300 MG tablet      One at bedtime   30 tablet   11   . traMADol (ULTRAM) 50 MG tablet   Oral   Take 50 mg by mouth every 4 (four) hours as needed for moderate pain or severe pain.          . vitamin B-12 (CYANOCOBALAMIN) 1000 MCG  tablet   Oral   Take 1,000 mcg by mouth at bedtime.          Marland Kitchen EXPIRED: albuterol (PROVENTIL HFA;VENTOLIN HFA) 108 (90 BASE) MCG/ACT inhaler   Inhalation   Inhale 2 puffs into the lungs every 6 (six) hours as needed. For wheezing         . benzonatate (TESSALON) 200 MG capsule   Oral   Take 200 mg by mouth every 6 (six) hours as needed for cough.         Marland Kitchen HYDROcodone-acetaminophen (NORCO/VICODIN) 5-325 MG per tablet   Oral   Take 1 tablet by mouth every 4 (four) hours as needed for moderate pain or severe pain. 1/2-1 every 3-4 hours as needed for cough and pain          BP 157/61  Pulse 83  Temp(Src) 98.5 F (36.9 C) (Oral)  Resp 22  Wt 188 lb (85.276 kg)  SpO2 97% Physical Exam  Constitutional: She is oriented to person, place, and time. She appears well-developed and well-nourished.  HENT:  Head: Normocephalic and atraumatic.  Eyes: EOM are normal. Pupils are equal, round, and reactive to light.  Neck: Neck supple.  Cardiovascular: Regular rhythm and intact distal pulses.   Pulmonary/Chest: Effort normal. No respiratory distress.  Abdominal: Soft. Bowel sounds are normal. She exhibits no distension. There is no tenderness.  Genitourinary:  Rectal exam: Nontender, no masses, maroon stool  Musculoskeletal: Normal range of motion. She exhibits no edema.  Neurological: She is alert and oriented to person, place, and time.  Skin: Skin is warm and dry.    ED Course  Procedures (including critical care time)  3:01 AM PROCEDURE: anoscopy performed. Sterile prep. Consent obtained. Timeout called. Visualized maroon stool above the tip of Anoscope. No obvious hemorrhoids.    Labs Review Labs Reviewed  CBC - Abnormal; Notable for the following:    Hemoglobin 11.3 (*)    HCT 35.3 (*)    RDW 16.5 (*)    All other components within normal limits  COMPREHENSIVE METABOLIC PANEL - Abnormal; Notable for the following:    Potassium 3.4 (*)    Chloride 95 (*)     Glucose, Bld 430 (*)    BUN 81 (*)    Creatinine, Ser 3.85 (*)    Albumin 3.0 (*)    Total Bilirubin 0.2 (*)    GFR calc non Af Amer 11 (*)    GFR calc Af Amer 13 (*)    All other components within normal limits  TYPE AND SCREEN  ABO/RH       3:04 AM d/w Dr Arlyce Dice, plan admit, GI will see in the am MDM  Diagnosis: Rectal bleeding, elevated creatinine history of same, hyperglycemia history of diabetes  Labs obtained and reviewed as above, type and cross No abdominal tenderness holding CT abdomen/ pel at this time. Anoscopy performed GI consulted Med Admit  Sunnie Nielsen, MD 11/06/13 [phone]

## 2013-11-06 NOTE — Progress Notes (Signed)
Pt HS CBG is 87. NP floor cover contacted and decreased dose of Lantus to 8units. New order carried out. Will continue to monitor.

## 2013-11-06 NOTE — Progress Notes (Signed)
[facility] PROGRESS NOTE  [name] ZOX:[id number] DOB: [birth year] DOA: 11/06/2013 PCP: Nadean Corwin, MD I have seen and examined pt who is a 67yo with history of type 2 diabetes on insulin, GERD, depression, hypertension, hyperlipidemia, COPD, and chronic kidney disease stage IV/V admitted this am by Dr Betti Cruz with rectal bleeding, hgb stable, GI consulted. She denies any further rectal bleeding, seen by GI  And plan is to hold off repeat colonoscopy for >> repeat to be recommended only if overt bleeding, will follow.     [name]  [facility] Pager [phone]. If 7PM-7AM, please contact night-coverage at [email or web address], password [email or web address] 11/06/2013, 7:03 PM  LOS: 0 days

## 2013-11-06 NOTE — Progress Notes (Signed)
Inpatient Diabetes Program Recommendations  AACE/ADA: New Consensus Statement on Inpatient Glycemic Control (2013)  Target Ranges:  Prepandial:   less than 140 mg/dL      Peak postprandial:   less than 180 mg/dL (1-2 hours)      Critically ill patients:  140 - 180 mg/dL   Results for [name], [name] (MRN [id number]) as of 11/06/2013 14:01  Ref. Range 06/15/2011 22:19  Hemoglobin A1C Latest Range: <5.7 % 8.8 (H)   Results for [name], [name] (MRN [id number]) as of 11/06/2013 14:01  Ref. Range 11/05/2013 23:40 11/06/2013 06:30  Glucose Latest Range: 70-99 mg/dL 562 (H) 56 (L)   Results for [name], [name] (MRN [id number]) as of 11/06/2013 14:01  Ref. Range 11/06/2013 04:15 11/06/2013 08:19 11/06/2013 09:03 11/06/2013 11:55  Glucose-Capillary Latest Range: 70-99 mg/dL 696 (H) 39 (LL) 295 (H) 91    Inpatient Diabetes Program Recommendations Insulin - Basal: May want to discontinue basal insulin unitl blood glucose is consistently greater than 180 mg/dl without any further episodes of hypoglycemia. Correction (SSI): Please consider decreasing Novolog correction to sensitive correction scale and adjust accordingly. HgbA1C: Please consider ordering an A1C to determine glycemic control over the past 2-3 months.  Note: Patient has a history of diabetes and takes Lantus 60 units QAM, Lantus 40 units QHS, and Humalog 20 units BID as an outpatient for diabetes management.  Currently, patient is ordered to receive Lantus 10 units BID, Novolog 0-20 units AC, and Novolog 0-5 units HS for inpatient glycemic control.  Initial lab gluocse was noted to be 430 mg/dl on 28/41 @ 32:44 and trended down without any glycemic treatment.  Blood glucose noted to be 56 mg/dl this morning at 0:10 am and 39 mg/dl this morning at 2:72 am.  Due to renal insufficiency and noted hypoglycemia, recommend discontinuing basal insulin at this time, decreasing Novolog correction to sensitive scale, and ordering an A1C.  Once blood  glucose is consistently greater than 180 mg/dl (without hypoglycemia treatment) may want to consider reordering low dose basal insulin.  Will continue to follow.  Thanks, Orlando Penner, RN, MSN, CCRN Diabetes Coordinator Inpatient Diabetes Program [phone] (Team Pager) [phone] (AP office) [phone] [facility] office)

## 2013-11-07 DIAGNOSIS — I5032 Chronic diastolic (congestive) heart failure: Secondary | ICD-10-CM

## 2013-11-07 LAB — GLUCOSE, CAPILLARY
Glucose-Capillary: 121 mg/dL — ABNORMAL HIGH (ref 70–99)
Glucose-Capillary: 144 mg/dL — ABNORMAL HIGH (ref 70–99)
Glucose-Capillary: 40 mg/dL — CL (ref 70–99)
Glucose-Capillary: 85 mg/dL (ref 70–99)

## 2013-11-07 LAB — CBC
MCH: 29.1 pg (ref 26.0–34.0)
MCHC: 32.4 g/dL (ref 30.0–36.0)
MCV: 89.8 fL (ref 78.0–100.0)
Platelets: 165 10*3/uL (ref 150–400)
RDW: 16.5 % — ABNORMAL HIGH (ref 11.5–15.5)
WBC: 5.2 10*3/uL (ref 4.0–10.5)

## 2013-11-07 MED ORDER — ASPIRIN 81 MG PO TABS: 81.0000 mg | ORAL_TABLET | Freq: Every day | ORAL | Status: AC

## 2013-11-07 MED ORDER — ASPIRIN 81 MG PO TABS: 81.0000 mg | ORAL_TABLET | Freq: Every day | ORAL | Status: DC

## 2013-11-07 MED ORDER — MUPIROCIN 2 % EX OINT
1.0000 "application " | TOPICAL_OINTMENT | Freq: Two times a day (BID) | CUTANEOUS | Status: DC
  Administered 2013-11-07: 1 via NASAL
  Filled 2013-11-07: qty 22

## 2013-11-07 MED ORDER — CHLORHEXIDINE GLUCONATE CLOTH 2 % EX PADS: 6.0000 | MEDICATED_PAD | Freq: Every day | CUTANEOUS | Status: DC

## 2013-11-07 NOTE — Progress Notes (Signed)
Hypoglycemic Event  CBG: 41  Treatment: 15 GM carbohydrate snack  Symptoms: None  Follow-up CBG: Time:12:52 CBG Result:69  Possible Reasons for Event: Inadequate meal intake  Comments/MD notified: K. Cristopher Peru L  Remember to initiate Hypoglycemia Order Set & complete

## 2013-11-07 NOTE — Progress Notes (Signed)
Pt CBG 41, given juices and re-check after goes up to 69. Pt continuously given juices, well tolerated. Pt appears in no distress. Will continue to monitor.

## 2013-11-07 NOTE — Care Management Note (Signed)
    Page 1 of 1   11/07/2013     5:04:50 PM   CARE MANAGEMENT NOTE 11/07/2013  Patient:  [name], [name]   Account Number:  [id number]  Date Initiated:  11/07/2013  Documentation initiated by:  Letha Cape  Subjective/Objective Assessment:   dx rectal bleeding  admit- lives with spouse.     Action/Plan:   Anticipated DC Date:  11/07/2013   Anticipated DC Plan:  HOME/SELF CARE      DC Planning Services  CM consult      Choice offered to / List presented to:             Status of service:  Completed, signed off Medicare Important Message given?   (If response is "NO", the following Medicare IM given date fields will be blank) Date Medicare IM given:   Date Additional Medicare IM given:    Discharge Disposition:  HOME/SELF CARE  Per UR Regulation:  Reviewed for med. necessity/level of care/duration of stay  If discussed at Long Length of Stay Meetings, dates discussed:    Comments:  12/ 18/14 17:03 Letha Cape RN, BSN [phone] patient lives with spouse, no NCM referral, no needs anticipated

## 2013-11-07 NOTE — Progress Notes (Signed)
          Daily Rounding Note  11/07/2013, 10:07 AM  LOS: 1 day   SUBJECTIVE:       No bleeding yesterday or today.  No stools at all.  Hungry on clears.  Not dizzy, SOB or weak.  Feels ready to go home. CBG of 41 this AM.   OBJECTIVE:         Vital signs in last 24 hours:    Temp:  [97.8 F (36.6 C)-97.9 F (36.6 C)] 97.9 F (36.6 C) (12/18 0443) Pulse Rate:  [62-73] 68 (12/18 0443) Resp:  [18] 18 (12/18 0443) BP: (112-159)/(66-71) 112/66 mmHg (12/18 0443) SpO2:  [97 %-100 %] 97 % (12/18 0443) Weight:  [84.1 kg (185 lb 6.5 oz)] 84.1 kg (185 lb 6.5 oz) (12/18 0443) Last BM Date: 11/05/13 General: looks well, comfortable.    Heart: RRR Chest: clear bil.  No labored breathing Abdomen: NT, active BS, ND  Extremities: no CCE Neuro/Psych:  Good spirits, no confusion.   Intake/Output from previous day:    Intake/Output this shift: Total I/O In: 420 [P.O.:420] Out: -   Lab Results:  Recent Labs  11/06/13 0630 11/06/13 1837 11/07/13 0800  WBC 6.3 6.7 5.2  HGB 11.2* 12.3 11.4*  HCT 35.0* 38.8 35.2*  PLT 158 183 165   BMET  Recent Labs  11/05/13 2340 11/06/13 0630  NA 135 142  K 3.4* 2.9*  CL 95* 101  CO2 26 30  GLUCOSE 430* 56*  BUN 81* 81*  CREATININE 3.85* 3.73*  CALCIUM 8.9 9.2    ASSESMENT:   * Post polypectomy LGI bleed. Most likely from the polypectomy site at hepatic flaxure.  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  * Insulin dependent type 2 DM.  Labile blood sugars.  * Hypokalemia. No rechecked today.     PLAN   *  Start solids.  Ok from GI view to discharge home.  Pt aware that is she developes recurrent hematochezia she needs to return to ED or call GI for direct admission.  *  No ASA for next 2 weeks.  *  Dr Marzetta Board office will be in touch for follow up.    Jennye Moccasin  11/07/2013, 10:07 AM Pager:  [phone]

## 2013-11-07 NOTE — Progress Notes (Signed)
Pt given discharge instructions and prescriptions.  PIV removed.  Pt taken via wheelchair to discharge location.

## 2013-11-07 NOTE — Progress Notes (Signed)
Patient seen, examined, and I agree with the above documentation, including the assessment and plan. Tolerated solid food for lunch, no further GI bleeding Okay with discharge home, but I have told the patient that should rebleeding occur I would like her to come back to the ER immediately. She voices understanding. If rebleeding occurs she will most likely need colonoscopy

## 2013-11-07 NOTE — Progress Notes (Signed)
Blood sugar checked 144. Will continue to monitor.

## 2013-11-15 ENCOUNTER — Encounter (HOSPITAL_COMMUNITY)
Admission: RE | Admit: 2013-11-15 | Discharge: 2013-11-15 | Disposition: A | Discharge status: Home / Self Care | Payer: Medicare Other | Source: Ambulatory Visit | Attending: Nephrology | Admitting: Nephrology

## 2013-11-15 LAB — IRON AND TIBC
Iron: 103 ug/dL (ref 42–135)
Saturation Ratios: 43 % (ref 20–55)
TIBC: 242 ug/dL — ABNORMAL LOW (ref 250–470)
UIBC: 139 ug/dL (ref 125–400)

## 2013-11-15 LAB — POCT HEMOGLOBIN-HEMACUE: Hemoglobin: 12.2 g/dL (ref 12.0–15.0)

## 2013-11-15 LAB — FERRITIN: Ferritin: 199 ng/mL (ref 10–291)

## 2013-11-15 MED ORDER — DARBEPOETIN ALFA-POLYSORBATE 100 MCG/0.5ML IJ SOLN: 100.0000 ug | INTRAMUSCULAR | Status: DC

## 2013-11-26 NOTE — Discharge Summary (Addendum)
Physician Discharge Summary  [name] [id number] DOB: 1946-01-24 DOA: 11/06/2013  PCP: Alesia Richards, MD  Admit date: 11/06/2013 Discharge date: 11/07/2013  Time spent: <30 minutes  Recommendations for Outpatient Follow-up:  Follow-up Information   Follow up with MCKEOWN,WILLIAM DAVID, MD. (In one to 2 weeks, call for appointment upon discharge the)    Specialty:  Internal Medicine   Contact information:   [address] [place] [address] [phone]       Follow up with Erskine Emery, MD. (as directed)    Specialty:  Gastroenterology   Contact information:   [place] [place] [address] [phone]       Discharge Diagnoses:  Principal Problem:   Rectal bleeding Active Problems:   DYSLIPIDEMIA   Overweight(278.02)   DEPRESSION   HYPERTENSION, UNSPECIFIED   Chronic diastolic heart failure   COPD, MILD   GASTROESOPHAGEAL REFLUX DISEASE   Acute blood loss anemia   CKD (chronic kidney disease), stage IV   GI bleed   Discharge Condition: improved/stable  Diet recommendation: modified carb  Filed Weights   11/05/13 2327 11/06/13 0450 11/07/13 0443  Weight: 85.276 kg (188 lb) 83.7 kg (184 lb 8.4 oz) 84.1 kg (185 lb 6.5 oz)    History of present illness:  [name] is a 68 y.o. African American female this history of type 2 diabetes on insulin, GERD, depression, hypertension, hyperlipidemia, COPD, and chronic kidney disease stage IV/V presents with the above complaints. Patient reports that she had a colonoscopy on 08/19/2013 and had polypectomy. She had a repeat colonoscopy on 10/28/2013 for colonic polyposis and had polypectomy at that time. She reports that she has been doing well at home, however at 10 p.m. last night when she had a bowel movement she noted red blood in her stools. She presents to the emergency department for further. While in the emergency department had multiple bowel movements with blood in  her stools. Her hemoglobin was 11.3. Hospitalist service was asked to admit the patient for further care and management. She denies any recent fevers, chills, nausea, vomiting, chest pain, shortness of breath, abdominal pain, headaches or vision changes. Patient on aspirin, denies any NSAID use at home   Hospital Course:  GI bleed presumed lower GI bleed in origin  As discussed above upon admission pt was placed on PPI twice daily. CBC every was cycled and remained stable. GI was consulted an saw pt and given that she had had recent colonoscopy no further endo eval was recommended.Her ASA was held and GI followed and she was started on a diet which she tolerated and it was advanced. She is to follow up with PCP and as directed. Acute blood loss anemia  Management was as discussed above. Hemoglobin stable.  Hypertension  Her BP remained Stable and she is tpContinue home antihypertensive medications on discharge.  Chronic diastolic heart failure   Patient appears compensated. Continue home diuretic therapy.  Chronic kidney disease stage IV/V  Renal function appears to be at baseline. Continue to monitor. Continue home medications including diuretic, paricalcitol, calcium acetate, and cinacalcet.  GERD  Started the patient on IV PPI twice daily.  Depression  Stable. Continue home medications.  Hyperlipidemia  Continue home medications.  History of gout  Continue home medications.  Diabetes  She is to continue her outpt Lantus on d/c.      Procedures:  none  Consultations:  GI  Discharge Exam: Filed Vitals:   11/07/13 1429  BP:  122/63  Pulse: 67  Temp: 97.7 F (36.5 C)  Resp: 20     Discharge Instructions  Discharge Orders   Future Appointments Provider Department Dept Phone   11/15/2013 9:45 AM [facility] [place] [phone]   02/06/2014 9:00 AM Unk Pinto, MD [facility]  [phone]   Future Orders Complete By Expires   Diet Carb Modified  As directed    Increase activity slowly  As directed        Medication List         albuterol 108 (90 BASE) MCG/ACT inhaler  Commonly known as:  PROVENTIL HFA;VENTOLIN HFA  Inhale 2 puffs into the lungs every 6 (six) hours as needed. For wheezing     albuterol 108 (90 BASE) MCG/ACT inhaler  Commonly known as:  PROVENTIL HFA;VENTOLIN HFA  Inhale 1-2 puffs into the lungs every 6 (six) hours as needed for wheezing or shortness of breath.     allopurinol 300 MG tablet  Commonly known as:  ZYLOPRIM  Take 300 mg by mouth daily with breakfast.     aspirin 81 MG tablet  Take 1 tablet (81 mg total) by mouth daily.     atorvastatin 40 MG tablet  Commonly known as:  LIPITOR  Take 40 mg by mouth daily with breakfast.     benzonatate 200 MG capsule  Commonly known as:  TESSALON  Take 200 mg by mouth every 6 (six) hours as needed for cough.     calcium acetate 667 MG capsule  Commonly known as:  PHOSLO  Take 1,334 mg by mouth 3 (three) times daily with meals.     cetirizine 10 MG tablet  Commonly known as:  ZYRTEC  Take 10 mg by mouth daily.     Cholecalciferol 1000 UNITS tablet  Take 4,000 Units by mouth daily.     cinacalcet 30 MG tablet  Commonly known as:  SENSIPAR  Take 30 mg by mouth daily.     citalopram 40 MG tablet  Commonly known as:  CELEXA  take 1 tablet by mouth once daily for MOOD     cloNIDine 0.3 MG tablet  Commonly known as:  CATAPRES  Take 0.3 mg by mouth 2 (two) times daily.     colchicine 0.6 MG tablet  Take 0.6 mg by mouth daily.     fluticasone 50 MCG/ACT nasal spray  Commonly known as:  FLONASE  Place 1 spray into both nostrils 2 (two) times daily.     fluticasone 50 MCG/ACT nasal spray  Commonly known as:  FLONASE  Place 1 spray into both nostrils daily as needed for allergies or rhinitis.     furosemide 80 MG tablet  Commonly known as:  LASIX  Take 160 mg by mouth 2  (two) times daily.     HYDROcodone-acetaminophen 5-325 MG per tablet  Commonly known as:  NORCO/VICODIN  Take 1 tablet by mouth every 4 (four) hours as needed for moderate pain or severe pain. 1/2-1 every 3-4 hours as needed for cough and pain     insulin glargine 100 UNIT/ML injection  Commonly known as:  LANTUS  Inject 40-60 Units into the skin 2 (two) times daily. 60 units in the morning and 40 units at bedtime     insulin lispro 100 UNIT/ML injection  Commonly known as:  HUMALOG  Inject 20 Units into the skin 2 (two) times daily with a meal.     levothyroxine  50 MCG tablet  Commonly known as:  SYNTHROID, LEVOTHROID  Take 50 mcg by mouth daily before breakfast.     metolazone 5 MG tablet  Commonly known as:  ZAROXOLYN  Take 5 mg by mouth every Monday, Wednesday, and Friday.     paricalcitol 1 MCG capsule  Commonly known as:  ZEMPLAR  Take 1 mcg by mouth every Monday, Wednesday, and Friday.     potassium chloride SA 20 MEQ tablet  Commonly known as:  K-DUR,KLOR-CON  Take 40 mEq by mouth 3 (three) times daily.     pregabalin 50 MG capsule  Commonly known as:  LYRICA  Take 50 mg by mouth at bedtime as needed (nerve pain).     ranitidine 300 MG tablet  Commonly known as:  ZANTAC  One at bedtime     traMADol 50 MG tablet  Commonly known as:  ULTRAM  Take 50 mg by mouth every 4 (four) hours as needed for moderate pain or severe pain.     vitamin B-12 1000 MCG tablet  Commonly known as:  CYANOCOBALAMIN  Take 1,000 mcg by mouth at bedtime.       Allergies  Allergen Reactions  . Codeine Hives and Itching      The results of significant diagnostics from this hospitalization (including imaging, microbiology, ancillary and laboratory) are listed below for reference.    Significant Diagnostic Studies: No results found.  Microbiology: Recent Results (from the past 240 hour(s))  MRSA PCR SCREENING     Status: Abnormal   Collection Time    11/06/13  4:53 AM       Result Value Range Status   MRSA by PCR POSITIVE (*) NEGATIVE Final   Comment:            The GeneXpert MRSA Assay (FDA     approved for NASAL specimens     only), is one component of a     comprehensive MRSA colonization     surveillance program. It is not     intended to diagnose MRSA     infection nor to guide or     monitor treatment for     MRSA infections.     RESULT CALLED TO, READ BACK BY AND VERIFIED WITH:     C. FOREST RN 7:35 11/06/13 ([email or web address])     Labs: Basic Metabolic Panel:  Recent Labs Lab 11/05/13 2340 11/06/13 0630  NA 135 142  K 3.4* 2.9*  CL 95* 101  CO2 26 30  GLUCOSE 430* 56*  BUN 81* 81*  CREATININE 3.85* 3.73*  CALCIUM 8.9 9.2   Liver Function Tests:  Recent Labs Lab 11/05/13 2340  AST 21  ALT 20  ALKPHOS 83  BILITOT 0.2*  PROT 6.4  ALBUMIN 3.0*   No results found for this basename: LIPASE, AMYLASE,  in the last 168 hours No results found for this basename: AMMONIA,  in the last 168 hours CBC:  Recent Labs Lab 11/05/13 2340 11/06/13 0630 11/06/13 1837 11/07/13 0800  WBC 6.1 6.3 6.7 5.2  HGB 11.3* 11.2* 12.3 11.4*  HCT 35.3* 35.0* 38.8 35.2*  MCV 91.2 90.7 91.3 89.8  PLT 169 158 183 165   Cardiac Enzymes: No results found for this basename: CKTOTAL, CKMB, CKMBINDEX, TROPONINI,  in the last 168 hours BNP: BNP (last 3 results) No results found for this basename: PROBNP,  in the last 8760 hours CBG:  Recent Labs Lab 11/07/13 0055 11/07/13 0123 11/07/13 0439 11/07/13 0755 11/07/13 1150  GLUCAP 69* 144* 121* 85 145*       Signed:  Thales Knipple C  [facility] 11/07/2013, 2:50 PM

## 2013-11-29 ENCOUNTER — Encounter (HOSPITAL_COMMUNITY)
Admission: RE | Admit: 2013-11-29 | Discharge: 2013-11-29 | Disposition: A | Discharge status: Home / Self Care | Payer: Medicare Other | Source: Ambulatory Visit | Attending: Nephrology | Admitting: Nephrology

## 2013-11-29 DIAGNOSIS — D638 Anemia in other chronic diseases classified elsewhere: Secondary | ICD-10-CM | POA: Insufficient documentation

## 2013-11-29 DIAGNOSIS — N184 Chronic kidney disease, stage 4 (severe): Secondary | ICD-10-CM | POA: Insufficient documentation

## 2013-11-29 LAB — RENAL FUNCTION PANEL
ALBUMIN: 3.2 g/dL — AB (ref 3.5–5.2)
BUN: 80 mg/dL — AB (ref 6–23)
CHLORIDE: 97 meq/L (ref 96–112)
CO2: 28 mEq/L (ref 19–32)
Calcium: 9.1 mg/dL (ref 8.4–10.5)
Creatinine, Ser: 4.15 mg/dL — ABNORMAL HIGH (ref 0.50–1.10)
GFR calc Af Amer: 12 mL/min — ABNORMAL LOW (ref >90)
GFR calc non Af Amer: 10 mL/min — ABNORMAL LOW (ref >90)
GLUCOSE: 187 mg/dL — AB (ref 70–99)
Phosphorus: 4.5 mg/dL (ref 2.3–4.6)
Potassium: 3.5 mEq/L — ABNORMAL LOW (ref 3.7–5.3)
SODIUM: 140 meq/L (ref 137–147)

## 2013-11-29 MED ORDER — DARBEPOETIN ALFA-POLYSORBATE 100 MCG/0.5ML IJ SOLN: 100.0000 ug | INTRAMUSCULAR | Status: DC

## 2013-12-02 LAB — POCT HEMOGLOBIN-HEMACUE: HEMOGLOBIN: 12.4 g/dL (ref 12.0–15.0)

## 2013-12-12 ENCOUNTER — Other Ambulatory Visit (HOSPITAL_COMMUNITY): Payer: Self-pay | Admitting: *Deleted

## 2013-12-13 ENCOUNTER — Encounter (HOSPITAL_COMMUNITY): Payer: Medicare Other

## 2013-12-16 ENCOUNTER — Other Ambulatory Visit: Payer: Self-pay | Admitting: Physician Assistant

## 2014-01-30 DIAGNOSIS — M1990 Unspecified osteoarthritis, unspecified site: Secondary | ICD-10-CM | POA: Insufficient documentation

## 2014-01-30 DIAGNOSIS — D649 Anemia, unspecified: Secondary | ICD-10-CM | POA: Insufficient documentation

## 2014-01-30 DIAGNOSIS — F419 Anxiety disorder, unspecified: Secondary | ICD-10-CM | POA: Insufficient documentation

## 2014-01-30 DIAGNOSIS — I509 Heart failure, unspecified: Secondary | ICD-10-CM | POA: Insufficient documentation

## 2014-01-30 DIAGNOSIS — E213 Hyperparathyroidism, unspecified: Secondary | ICD-10-CM | POA: Insufficient documentation

## 2014-02-06 ENCOUNTER — Ambulatory Visit (INDEPENDENT_AMBULATORY_CARE_PROVIDER_SITE_OTHER): Payer: Medicare Other | Admitting: Internal Medicine

## 2014-02-06 ENCOUNTER — Encounter: Payer: Self-pay | Admitting: Internal Medicine

## 2014-02-06 VITALS — BP 150/80 | HR 80 | Temp 97.7°F | Resp 16 | Ht 65.0 in | Wt 191.2 lb

## 2014-02-06 DIAGNOSIS — E1029 Type 1 diabetes mellitus with other diabetic kidney complication: Secondary | ICD-10-CM

## 2014-02-06 DIAGNOSIS — J330 Polyp of nasal cavity: Secondary | ICD-10-CM

## 2014-02-06 DIAGNOSIS — Z1212 Encounter for screening for malignant neoplasm of rectum: Secondary | ICD-10-CM

## 2014-02-06 DIAGNOSIS — Z79899 Other long term (current) drug therapy: Secondary | ICD-10-CM

## 2014-02-06 DIAGNOSIS — Z789 Other specified health status: Secondary | ICD-10-CM

## 2014-02-06 DIAGNOSIS — E785 Hyperlipidemia, unspecified: Secondary | ICD-10-CM

## 2014-02-06 DIAGNOSIS — E559 Vitamin D deficiency, unspecified: Secondary | ICD-10-CM

## 2014-02-06 DIAGNOSIS — Z1331 Encounter for screening for depression: Secondary | ICD-10-CM

## 2014-02-06 DIAGNOSIS — I10 Essential (primary) hypertension: Secondary | ICD-10-CM

## 2014-02-06 DIAGNOSIS — E538 Deficiency of other specified B group vitamins: Secondary | ICD-10-CM

## 2014-02-06 LAB — LIPID PANEL
CHOL/HDL RATIO: 5 ratio
CHOLESTEROL: 176 mg/dL (ref 0–200)
HDL: 35 mg/dL — ABNORMAL LOW (ref >39)
LDL Cholesterol: 111 mg/dL — ABNORMAL HIGH (ref 0–99)
Triglycerides: 152 mg/dL — ABNORMAL HIGH (ref <150)
VLDL: 30 mg/dL (ref 0–40)

## 2014-02-06 LAB — VITAMIN B12

## 2014-02-06 LAB — HEMOGLOBIN A1C
HEMOGLOBIN A1C: 8.3 % — AB (ref <5.7)
Mean Plasma Glucose: 192 mg/dL — ABNORMAL HIGH (ref <117)

## 2014-02-06 LAB — TSH: TSH: 1.886 u[IU]/mL (ref 0.350–4.500)

## 2014-02-06 LAB — MAGNESIUM: Magnesium: 2.1 mg/dL (ref 1.5–2.5)

## 2014-02-06 MED ORDER — TRAMADOL HCL 50 MG PO TABS: 50.0000 mg | ORAL_TABLET | Freq: Four times a day (QID) | ORAL | Status: AC

## 2014-02-06 MED ORDER — LORAZEPAM 2 MG PO TABS: ORAL_TABLET | ORAL | Status: AC

## 2014-02-06 NOTE — Progress Notes (Signed)
Patient ID: [name], female   DOB: 1946/04/01, 68 y.o.   MRN: [id number]     Annual Screening Comprehensive Examination  This very nice 68 y.o. MBF presents for complete physical.  Patient has been followed for HTN, T1 IDDM w/Stage IV CKD, Hyperlipidemia, and Vitamin D Deficiency. Reports occasional bleeding from Rt nares.    HTN predates since 39 . Patient's BP has been controlled at home. Today's BP: 150/80 mmHg. Patient denies any cardiac symptoms as chest pain, palpitations, shortness of breath, dizziness or ankle swelling.   Patient's hyperlipidemia is controlled with diet and medications. Patient denies myalgias or other medication SE's. Last cholesterol last visit was 173, triglycerides 300, HDL 30 and LDL 60.     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 Patient is on BID dosing of both Lantus and Humalog and last A1c was 10.3% in June 2014 (as patient has been lost to follow-up). She reports occas mild hypoglycemic episodes if she delays a meal. She also admits overeating (BMI 32).  Patient denies  visual blurring, diabetic polys, or paresthesias and does have regular eye exams   Finally, patient has history of Vitamin D Deficiency of 13 in 2008 with last vitamin D 44 in June 2014.     Medication Sig  . albuterol (PROVENTIL HFA;VENTOLIN HFA) 108 (90 BASE) MCG/ACT inhaler Inhale 1-2 puffs into the lungs every 6 (six) hours as needed for wheezing or shortness of breath.  . allopurinol (ZYLOPRIM) 300 MG tablet Take 300 mg by mouth daily with breakfast.   . aspirin 81 MG tablet Take 1 tablet (81 mg total) by mouth daily.  Marland Kitchen atorvastatin (LIPITOR) 40 MG tablet Take 40 mg by mouth daily with breakfast.  . calcium acetate (PHOSLO) 667 MG capsule Take 1,334 mg by mouth 3 (three) times daily with meals.   . cetirizine (ZYRTEC) 10 MG tablet Take 10 mg by mouth daily.  .  Cholecalciferol 1000 UNITS tablet Take 4,000 Units by mouth daily.  . cinacalcet (SENSIPAR) 30 MG tablet Take 30 mg by mouth daily.  . citalopram (CELEXA) 40 MG tablet take 1 tablet by mouth once daily for MOOD  . cloNIDine (CATAPRES) 0.3 MG tablet Take 0.3 mg by mouth 2 (two) times daily.  . colchicine 0.6 MG tablet Take 0.6 mg by mouth daily.  . fluticasone (FLONASE) 50 MCG/ACT nasal spray Place 1 spray into both nostrils 2 (two) times daily.  . furosemide (LASIX) 80 MG tablet Take 160 mg by mouth 2 (two) times daily.    HUMALOG   inject 20 units bid  . LANTUS SOLOSTAR 100 UNIT/ML Solostar Pen inject 60 units bid  . levothyroxine (SYNTHROID, LEVOTHROID) 50 MCG  Take 50 mcg by mouth daily before breakfast.   . metolazone (ZAROXOLYN) 5 MG tablet Take 5 mg by mouth every Mon Wed & Fri  . paricalcitol (ZEMPLAR) 1 MCG capsule Take 1 mcg by mouth every Mon Wed & Fri  . potassium chloride  (K-DUR,KLOR-CON) 20 MEQ t Take 40 mEq by mouth 3 (three) times daily.   . pregabalin (LYRICA) 50 MG capsule Take 50 mg by mouth at bedtime as needed (nerve pain).   . ranitidine (ZANTAC) 300 MG tablet One at bedtime  . vitamin B-12 (CYANOCOBALAMIN) 1000 MCG tablet Take 1,000 mcg by mouth at bedtime.      Allergies  Allergen Reactions  .  Codeine Hives and Itching    Past Medical History  Diagnosis Date  . Type II or unspecified type diabetes mellitus without mention of complication, not stated as uncontrolled   . Esophageal reflux   . Depressive disorder, not elsewhere classified   . Chronic cough   . Shortness of breath   . Hyperparathyroidism   . Colon polyposis   . HLD (hyperlipidemia)   . Renal insufficiency     function is stable at present-Dr. Justin Mend follows  . Unspecified essential hypertension   . HTN (hypertension)   . Anemia   . Anxiety   . DJD (degenerative joint disease)   . CHF (congestive heart failure)     with well preserved EF   . COPD (chronic obstructive pulmonary disease)      mild-" oxygen use 2 l/m nightly"    Past Surgical History  Procedure Laterality Date  . Insertion of dialysis catheter Left 10-03-13    3 yrs ago -Left upper arm AVGG  . Vascular surgery Left 2011    arm  . Colonoscopy w/ polypectomy  10-02-13    hx. past 6 months -2 removed  . Colonoscopy N/A 10/28/2013    Procedure: COLONOSCOPY;  Surgeon: Inda Castle, MD;  Location: [facility];  Service: Endoscopy;  Laterality: N/A;  . Hot hemostasis N/A 10/28/2013    Procedure: HOT HEMOSTASIS (ARGON PLASMA COAGULATION/BICAP);  Surgeon: Inda Castle, MD;  Location: [facility];  Service: Endoscopy;  Laterality: N/A;    Family History  Problem Relation Age of Onset  . Heart disease Father   . Cancer Sister   . Hypertension    . Diabetes    . Colon cancer Neg Hx     History  Substance Use Topics  . Smoking status: Former Smoker -- 1.00 packs/day for 30 years    Types: Cigarettes    Quit date: 11/22/2007  . Smokeless tobacco: Never Used     Comment: 1ppd x 30 years.  restarted and smoke 3 cigarettes daily  . Alcohol Use: No    ROS Constitutional: Denies fever, chills, weight loss/gain, headaches, insomnia, fatigue, night sweats, and change in appetite. Eyes: Denies redness, blurred vision, diplopia, discharge, itchy, watery eyes.  ENT: Denies discharge, congestion, post nasal drip, epistaxis, sore throat, earache, hearing loss, dental pain, Tinnitus, Vertigo, Sinus pain, snoring.  Cardio: Denies chest pain, palpitations, irregular heartbeat, syncope, dyspnea, diaphoresis, orthopnea, PND, claudication, edema Respiratory: denies cough, dyspnea, DOE, pleurisy, hoarseness, laryngitis, wheezing.  Gastrointestinal: Denies dysphagia, heartburn, reflux, water brash, pain, cramps, nausea, vomiting, bloating, diarrhea, constipation, hematemesis, melena, hematochezia, jaundice, hemorrhoids Genitourinary: Denies dysuria, frequency, urgency, nocturia, hesitancy, discharge, hematuria, flank  pain Breast:Breast lumps, nipple discharge, bleeding.  Musculoskeletal: Denies arthralgia, myalgia, stiffness, Jt. Swelling, pain, limp, and strain/sprain. Skin: Denies puritis, rash, hives, warts, acne, eczema, changing in skin lesion Neuro: No weakness, tremor, incoordination, spasms, paresthesia, pain Psychiatric: Denies confusion, memory loss, sensory loss Endocrine: Denies change in weight, skin, hair change, nocturia, and paresthesia, diabetic polys, visual blurring, hyper / hypo glycemic episodes.  Heme/Lymph: No excessive bleeding, bruising, enlarged lymph nodes.  Physical Exam  BP 150/80  Pulse 80  Temp(Src) 97.7 F (36.5 C) (Temporal)  Resp 16  Ht 5\' 5"  (1.651 m)  Wt 191 lb 3.2 oz (86.728 kg)  BMI 31.82 kg/m2  General Appearance: Well nourished, in no apparent distress. Eyes: PERRLA, EOMs, conjunctiva no swelling or erythema, normal fundi and vessels. Sinuses: No frontal/maxillary tenderness ENT/Mouth: EACs patent / TMs  nl. Nares  clear without erythema, swelling, mucoid exudates. Oral hygiene is good. No erythema, swelling, or exudate. Tongue normal, non-obstructing. Tonsils not swollen or erythematous. Hearing normal.  Neck: Supple, thyroid normal. No bruits, nodes or JVD. Respiratory: Respiratory effort normal.  BS equal and clear bilateral without rales, rhonci, wheezing or stridor. Cardio: Heart sounds are normal with regular rate and rhythm and no murmurs, rubs or gallops. Peripheral pulses are normal and equal bilaterally without edema. No aortic or femoral bruits. Chest: symmetric with normal excursions and percussion. Breasts: Symmetric, without lumps, nipple discharge, retractions, or fibrocystic changes.  Abdomen: Flat, soft, with bowl sounds. Nontender, no guarding, rebound, hernias, masses, or organomegaly.  Lymphatics: Non tender without lymphadenopathy.  Musculoskeletal: Full ROM all peripheral extremities, joint stability, 5/5 strength, and normal gait. Skin:  Warm and dry without rashes, lesions, cyanosis, clubbing or  ecchymosis.  Neuro: Cranial nerves intact, reflexes equal bilaterally. Normal muscle tone, no cerebellar symptoms. Sensation intact.  Pysch: Awake and oriented X 3, normal affect, Insight and Judgment appropriate.   Assessment and Plan  1. Annual Screening Examination 2. Hypertension  3. Hyperlipidemia 4. T1 IDDM w/ Stage IV CKD 5. Vitamin D Deficiency  Continue prudent diet as discussed, weight control, BP monitoring, regular exercise, and medications. Discussed med's effects and SE's. Screening labs and tests as requested with regular follow-up as recommended.

## 2014-02-06 NOTE — Patient Instructions (Signed)
Type 1 Diabetes Mellitus, Adult Type 1 diabetes mellitus, often simply referred to as diabetes, is a long-term (chronic) disease. It occurs when the islet cells in the pancreas that make insulin (a hormone) are destroyed and can no longer make insulin. Insulin is needed to move sugars from food into the tissue cells. The tissue cells use the sugars for energy. In people with type 1 diabetes, the sugars build up in the blood instead of going into the tissue cells. As a result, high blood sugar (hyperglycemia) develops. Without insulin, the body breaks down fat cells for the needed energy. This breakdown of fat cells produces acid chemicals (ketones), which increases the acid levels in the body. The effect of either high ketone or sugar (glucose) levels can be life-threatening.  Type 1 diabetes was also previously called juvenile diabetes. It most often occurs before the age of 28, but it can occur at any age. RISK FACTORS A person is predisposed to developing type 1 diabetes if someone in his or her family has the disease and is exposed to certain additional environmental triggers.  SYMPTOMS  Symptoms of type 1 diabetes may develop gradually over days to weeks or suddenly. The symptoms occur due to hyperglycemia. The symptoms can include:   Increased thirst (polydipsia).  Increased urination (polyuria).  Increased urination during the night (nocturia).  Weight loss. This weight loss may be rapid.  Frequent, recurring infections.  Tiredness (fatigue).  Weakness.  Vision changes, such as blurred vision.  Fruity smell to your breath.  Abdominal pain.  Nausea or vomiting. DIAGNOSIS  Type 1 diabetes is diagnosed when symptoms of diabetes are present and when blood glucose levels are increased. Your blood glucose level may be checked by one or more of the following blood tests:  A fasting blood glucose test. You will not be allowed to eat for at least 8 hours before a blood sample is  taken.  A random blood glucose test. Your blood glucose is checked at any time of the day regardless of when you ate.  A hemoglobin A1c blood glucose test. A hemoglobin A1c test provides information about blood glucose control over the previous 3 months. TREATMENT  Although type 1 diabetes cannot be prevented, it can be managed with insulin, diet, and exercise.  You will need to take insulin daily to keep blood glucose in the desired range.  You will need to match insulin dosing with exercise and healthy food choices. The treatment goal is to maintain the before-meal blood sugar (preprandial glucose) level at 70 130 mg/dL.  HOME CARE INSTRUCTIONS   Have your hemoglobin A1c level checked twice a year.  Perform daily blood glucose monitoring as directed by your caregiver.  Monitor urine ketones when you are ill and as directed by your caregiver.  Take your insulin as directed by your caregiver to maintain your blood glucose level in the desired range.  Never run out of insulin. It is needed every day.  Adjust insulin based on your intake of carbohydrates. Carbohydrates can raise blood glucose levels but need to be included in your diet. Carbohydrates provide vitamins, minerals, and fiber, which are an essential part of a healthy diet. Carbohydrates are found in fruits, vegetables, whole grains, dairy products, legumes, and foods containing added sugars.    Eat healthy foods. Alternate 3 meals with 3 snacks.  Maintain a healthy weight.  Carry a medical alert card or wear your medical alert jewelry.  Carry a 15 gram carbohydrate snack with  you at all times to treat low blood glucose (hypoglycemia). Some examples of 15 gram carbohydrate snacks include:  Glucose tablets, 3 or 4.   Glucose gel, 15 gram tube.  Raisins, 2 tablespoons (24 grams).  Jelly beans, 6.  Animal crackers, 8.  Fruit juice, regular soda, or low-fat milk, 4 ounces (120 mL).  Gummy treats,  9.    Recognize hypoglycemia. Hypoglycemia occurs with blood glucose levels of 70 mg/dL and below. The risk for hypoglycemia increases when fasting or skipping meals, during or after intense exercise, and during sleep. Hypoglycemia symptoms can include:  Tremors or shakes.  Decreased ability to concentrate.  Sweating.  Increased heart rate.  Headache.  Dry mouth.  Hunger.  Irritability.  Anxiety.  Restless sleep.  Altered speech or coordination.  Confusion.  Treat hypoglycemia promptly. If you are alert and able to safely swallow, follow the 15:15 rule:  Take 15 20 grams of rapid-acting glucose or carbohydrate. Rapid-acting options include glucose gel, glucose tablets, or 4 ounces (120 mL) of fruit juice, regular soda, or low-fat milk.  Check your blood glucose level 15 minutes after taking the glucose.   Take 15 20 grams more of glucose if the repeat blood glucose level is still 70 mg/dL or below.  Eat a meal or snack within 1 hour once blood glucose levels return to normal.  Be alert to polyuria and polydipsia, which are early signs of hyperglycemia. An early awareness of hyperglycemia allows for prompt treatment. Treat hyperglycemia as directed by your caregiver.  Engage in at least 150 minutes of moderate-intensity physical activity a week, spread over at least 3 days of the week or as directed by your caregiver.  Adjust your insulin dosing and food intake as needed if you start a new exercise or sport.  Follow your sick day plan at any time you are unable to eat or drink as usual.   Avoid tobacco use.  Limit alcohol intake to no more than 1 drink per day for nonpregnant women and 2 drinks per day for men. You should drink alcohol only when you are also eating food. Talk with your caregiver about whether alcohol is safe for you. Tell your caregiver if you drink alcohol several times a week.  Follow up with your caregiver regularly.  Schedule an eye exam  within 5 years of diagnosis and then annually.  Perform daily skin and foot care. Examine your skin and feet daily for cuts, bruises, redness, nail problems, bleeding, blisters, or sores. A foot exam by a caregiver should be done annually.  Brush your teeth and gums at least twice a day and floss at least once a day. Follow up with your dentist regularly.  Share your diabetes management plan with your workplace or school.  Stay up-to-date with immunizations.  Learn to manage stress.  Obtain ongoing diabetes education and support as needed.  Participate or seek rehabilitation as needed to maintain or improve independence and quality of life. Request a physical or occupational therapy referral if you are having foot or hand numbness or difficulties with grooming, dressing, eating, or physical activity. SEEK MEDICAL CARE IF:   You are unable to eat food or drink fluids for more than 6 hours.  You have nausea and vomiting for more than 6 hours.  Your blood glucose level is over 240 mg/dL.  There is a change in mental status.  You develop an additional serious illness.  You have diarrhea for more than 6 hours.  You have  sick or have had a fever for a couple of days and are not getting better.  You have pain during any physical activity. SEEK IMMEDIATE MEDICAL CARE IF:  You have difficulty breathing.  You have moderate to large ketone levels. MAKE SURE YOU:  Understand these instructions.  Will watch your condition.  Will get help right away if you are not doing well or get worse. Document Released: 11/04/2000 Document Revised: 08/01/2012 Document Reviewed: 06/05/2012 [employer] Patient Information 2014 [employer].    Hypertension As your heart beats, it forces blood through your arteries. This force is your blood pressure. If the pressure is too high, it is called hypertension (HTN) or high blood pressure. HTN is dangerous because you may have it and not know it.  High blood pressure may mean that your heart has to work harder to pump blood. Your arteries may be narrow or stiff. The extra work puts you at risk for heart disease, stroke, and other problems.  Blood pressure consists of two numbers, a higher number over a lower, 110/72, for example. It is stated as "110 over 72." The ideal is below 120 for the top number (systolic) and under 80 for the bottom (diastolic). Write down your blood pressure today. You should pay close attention to your blood pressure if you have certain conditions such as:  Heart failure.  Prior heart attack.  Diabetes  Chronic kidney disease.  Prior stroke.  Multiple risk factors for heart disease. To see if you have HTN, your blood pressure should be measured while you are seated with your arm held at the level of the heart. It should be measured at least twice. A one-time elevated blood pressure reading (especially in the Emergency Department) does not mean that you need treatment. There may be conditions in which the blood pressure is different between your right and left arms. It is important to see your caregiver soon for a recheck. Most people have essential hypertension which means that there is not a specific cause. This type of high blood pressure may be lowered by changing lifestyle factors such as:  Stress.  Smoking.  Lack of exercise.  Excessive weight.  Drug/tobacco/alcohol use.  Eating less salt. Most people do not have symptoms from high blood pressure until it has caused damage to the body. Effective treatment can often prevent, delay or reduce that damage. TREATMENT  When a cause has been identified, treatment for high blood pressure is directed at the cause. There are a large number of medications to treat HTN. These fall into several categories, and your caregiver will help you select the medicines that are best for you. Medications may have side effects. You should review side effects with your  caregiver. If your blood pressure stays high after you have made lifestyle changes or started on medicines,   Your medication(s) may need to be changed.  Other problems may need to be addressed.  Be certain you understand your prescriptions, and know how and when to take your medicine.  Be sure to follow up with your caregiver within the time frame advised (usually within two weeks) to have your blood pressure rechecked and to review your medications.  If you are taking more than one medicine to lower your blood pressure, make sure you know how and at what times they should be taken. Taking two medicines at the same time can result in blood pressure that is too low. SEEK IMMEDIATE MEDICAL CARE IF:  You develop a severe headache,   blurred or changing vision, or confusion.  You have unusual weakness or numbness, or a faint feeling.  You have severe chest or abdominal pain, vomiting, or breathing problems. MAKE SURE YOU:   Understand these instructions.  Will watch your condition.  Will get help right away if you are not doing well or get worse.   Diabetes and Exercise Exercising regularly is important. It is not just about losing weight. It has many health benefits, such as:  Improving your overall fitness, flexibility, and endurance.  Increasing your bone density.  Helping with weight control.  Decreasing your body fat.  Increasing your muscle strength.  Reducing stress and tension.  Improving your overall health. People with diabetes who exercise gain additional benefits because exercise:  Reduces appetite.  Improves the body's use of blood sugar (glucose).  Helps lower or control blood glucose.  Decreases blood pressure.  Helps control blood lipids (such as cholesterol and triglycerides).  Improves the body's use of the hormone insulin by:  Increasing the body's insulin sensitivity.  Reducing the body's insulin needs.  Decreases the risk for heart disease  because exercising:  Lowers cholesterol and triglycerides levels.  Increases the levels of good cholesterol (such as high-density lipoproteins [HDL]) in the body.  Lowers blood glucose levels. YOUR ACTIVITY PLAN  Choose an activity that you enjoy and set realistic goals. Your health care provider or diabetes educator can help you make an activity plan that works for you. You can break activities into 2 or 3 sessions throughout the day. Doing so is as good as one long session. Exercise ideas include:  Taking the dog for a walk.  Taking the stairs instead of the elevator.  Dancing to your favorite song.  Doing your favorite exercise with a friend. RECOMMENDATIONS FOR EXERCISING WITH TYPE 1 OR TYPE 2 DIABETES   Check your blood glucose before exercising. If blood glucose levels are greater than 240 mg/dL, check for urine ketones. Do not exercise if ketones are present.  Avoid injecting insulin into areas of the body that are going to be exercised. For example, avoid injecting insulin into:  The arms when playing tennis.  The legs when jogging.  Keep a record of:  Food intake before and after you exercise.  Expected peak times of insulin action.  Blood glucose levels before and after you exercise.  The type and amount of exercise you have done.  Review your records with your health care provider. Your health care provider will help you to develop guidelines for adjusting food intake and insulin amounts before and after exercising.  If you take insulin or oral hypoglycemic agents, watch for signs and symptoms of hypoglycemia. They include:  Dizziness.  Shaking.  Sweating.  Chills.  Confusion.  Drink plenty of water while you exercise to prevent dehydration or heat stroke. Body water is lost during exercise and must be replaced.  Talk to your health care provider before starting an exercise program to make sure it is safe for you. Remember, almost any type of activity  is better than none.    Cholesterol Cholesterol is a white, waxy, fat-like protein needed by your body in small amounts. The liver makes all the cholesterol you need. It is carried from the liver by the blood through the blood vessels. Deposits (plaque) may build up on blood vessel walls. This makes the arteries narrower and stiffer. Plaque increases the risk for heart attack and stroke. You cannot feel your cholesterol level even if it is   very high. The only way to know is by a blood test to check your lipid (fats) levels. Once you know your cholesterol levels, you should keep a record of the test results. Work with your caregiver to to keep your levels in the desired range. WHAT THE RESULTS MEAN:  Total cholesterol is a rough measure of all the cholesterol in your blood.  LDL is the so-called bad cholesterol. This is the type that deposits cholesterol in the walls of the arteries. You want this level to be low.  HDL is the good cholesterol because it cleans the arteries and carries the LDL away. You want this level to be high.  Triglycerides are fat that the body can either burn for energy or store. High levels are closely linked to heart disease. DESIRED LEVELS:  Total cholesterol below 200.  LDL below 100 for people at risk, below 70 for very high risk.  HDL above 50 is good, above 60 is best.  Triglycerides below 150. HOW TO LOWER YOUR CHOLESTEROL:  Diet.  Choose fish or white meat chicken and turkey, roasted or baked. Limit fatty cuts of red meat, fried foods, and processed meats, such as sausage and lunch meat.  Eat lots of fresh fruits and vegetables. Choose whole grains, beans, pasta, potatoes and cereals.  Use only small amounts of olive, corn or canola oils. Avoid butter, mayonnaise, shortening or palm kernel oils. Avoid foods with trans-fats.  Use skim/nonfat milk and low-fat/nonfat yogurt and cheeses. Avoid whole milk, cream, ice cream, egg yolks and cheeses. Healthy  desserts include angel food cake, ginger snaps, animal crackers, hard candy, popsicles, and low-fat/nonfat frozen yogurt. Avoid pastries, cakes, pies and cookies.  Exercise.  A regular program helps decrease LDL and raises HDL.  Helps with weight control.  Do things that increase your activity level like gardening, walking, or taking the stairs.  Medication.  May be prescribed by your caregiver to help lowering cholesterol and the risk for heart disease.  You may need medicine even if your levels are normal if you have several risk factors. HOME CARE INSTRUCTIONS   Follow your diet and exercise programs as suggested by your caregiver.  Take medications as directed.  Have blood work done when your caregiver feels it is necessary. MAKE SURE YOU:   Understand these instructions.  Will watch your condition.  Will get help right away if you are not doing well or get worse.      Vitamin D Deficiency Vitamin D is an important vitamin that your body needs. Having too little of it in your body is called a deficiency. A very bad deficiency can make your bones soft and can cause a condition called rickets.  Vitamin D is important to your body for different reasons, such as:   It helps your body absorb 2 minerals called calcium and phosphorus.  It helps make your bones healthy.  It may prevent some diseases, such as diabetes and multiple sclerosis.  It helps your muscles and heart. You can get vitamin D in several ways. It is a natural part of some foods. The vitamin is also added to some dairy products and cereals. Some people take vitamin D supplements. Also, your body makes vitamin D when you are in the sun. It changes the sun's rays into a form of the vitamin that your body can use. CAUSES   Not eating enough foods that contain vitamin D.  Not getting enough sunlight.  Having certain digestive system diseases that   make it hard to absorb vitamin D. These diseases include  Crohn's disease, chronic pancreatitis, and cystic fibrosis.  Having a surgery in which part of the stomach or small intestine is removed.  Being obese. Fat cells pull vitamin D out of your blood. That means that obese people may not have enough vitamin D left in their blood and in other body tissues.  Having chronic kidney or liver disease. RISK FACTORS Risk factors are things that make you more likely to develop a vitamin D deficiency. They include:  Being older.  Not being able to get outside very much.  Living in a nursing home.  Having had broken bones.  Having weak or thin bones (osteoporosis).  Having a disease or condition that changes how your body absorbs vitamin D.  Having dark skin.  Some medicines such as seizure medicines or steroids.  Being overweight or obese. SYMPTOMS Mild cases of vitamin D deficiency may not have any symptoms. If you have a very bad case, symptoms may include:  Bone pain.  Muscle pain.  Falling often.  Broken bones caused by a minor injury, due to osteoporosis. DIAGNOSIS A blood test is the best way to tell if you have a vitamin D deficiency. TREATMENT Vitamin D deficiency can be treated in different ways. Treatment for vitamin D deficiency depends on what is causing it. Options include:  Taking vitamin D supplements.  Taking a calcium supplement. Your caregiver will suggest what dose is best for you. HOME CARE INSTRUCTIONS  Take any supplements that your caregiver prescribes. Follow the directions carefully. Take only the suggested amount.  Have your blood tested 2 months after you start taking supplements.  Eat foods that contain vitamin D. Healthy choices include:  Fortified dairy products, cereals, or juices. Fortified means vitamin D has been added to the food. Check the label on the package to be sure.  Fatty fish like salmon or trout.  Eggs.  Oysters.  Do not use a tanning bed.  Keep your weight at a healthy  level. Lose weight if you need to.  Keep all follow-up appointments. Your caregiver will need to perform blood tests to make sure your vitamin D deficiency is going away. SEEK MEDICAL CARE IF:  You have any questions about your treatment.  You continue to have symptoms of vitamin D deficiency.  You have nausea or vomiting.  You are constipated.  You feel confused.  You have severe abdominal or back pain. MAKE SURE YOU:  Understand these instructions.  Will watch your condition.  Will get help right away if you are not doing well or get worse.   

## 2014-02-07 LAB — URINALYSIS, MICROSCOPIC ONLY
Bacteria, UA: NONE SEEN
Casts: NONE SEEN
Crystals: NONE SEEN
SQUAMOUS EPITHELIAL / LPF: NONE SEEN

## 2014-02-07 LAB — MICROALBUMIN / CREATININE URINE RATIO
CREATININE, URINE: 43 mg/dL
MICROALB UR: 42.59 mg/dL — AB (ref 0.00–1.89)
Microalb Creat Ratio: 990.5 mg/g — ABNORMAL HIGH (ref 0.0–30.0)

## 2014-02-19 ENCOUNTER — Other Ambulatory Visit: Payer: Self-pay | Admitting: Internal Medicine

## 2014-02-20 ENCOUNTER — Other Ambulatory Visit: Payer: Self-pay | Admitting: Otolaryngology

## 2014-03-15 ENCOUNTER — Other Ambulatory Visit: Payer: Self-pay | Admitting: Internal Medicine

## 2014-03-17 ENCOUNTER — Other Ambulatory Visit: Payer: Self-pay

## 2014-03-17 MED ORDER — COLCHICINE 0.6 MG PO TABS: 0.6000 mg | ORAL_TABLET | Freq: Every day | ORAL | Status: AC

## 2014-03-20 ENCOUNTER — Other Ambulatory Visit: Payer: Self-pay | Admitting: Physician Assistant

## 2014-04-16 ENCOUNTER — Emergency Department (HOSPITAL_COMMUNITY)
Admission: EM | Admit: 2014-04-16 | Discharge: 2014-04-16 | Disposition: A | Discharge status: Home / Self Care | Payer: Medicare Other | Attending: Emergency Medicine | Admitting: Emergency Medicine

## 2014-04-16 ENCOUNTER — Encounter (HOSPITAL_COMMUNITY): Payer: Self-pay | Admitting: Emergency Medicine

## 2014-04-16 ENCOUNTER — Emergency Department (HOSPITAL_COMMUNITY): Payer: Medicare Other

## 2014-04-16 DIAGNOSIS — N189 Chronic kidney disease, unspecified: Secondary | ICD-10-CM | POA: Insufficient documentation

## 2014-04-16 DIAGNOSIS — E785 Hyperlipidemia, unspecified: Secondary | ICD-10-CM | POA: Insufficient documentation

## 2014-04-16 DIAGNOSIS — Z860100 Personal history of colon polyps, unspecified: Secondary | ICD-10-CM | POA: Insufficient documentation

## 2014-04-16 DIAGNOSIS — F411 Generalized anxiety disorder: Secondary | ICD-10-CM | POA: Insufficient documentation

## 2014-04-16 DIAGNOSIS — N19 Unspecified kidney failure: Secondary | ICD-10-CM

## 2014-04-16 DIAGNOSIS — I509 Heart failure, unspecified: Secondary | ICD-10-CM | POA: Insufficient documentation

## 2014-04-16 DIAGNOSIS — IMO0002 Reserved for concepts with insufficient information to code with codable children: Secondary | ICD-10-CM | POA: Insufficient documentation

## 2014-04-16 DIAGNOSIS — F329 Major depressive disorder, single episode, unspecified: Secondary | ICD-10-CM | POA: Insufficient documentation

## 2014-04-16 DIAGNOSIS — K219 Gastro-esophageal reflux disease without esophagitis: Secondary | ICD-10-CM | POA: Insufficient documentation

## 2014-04-16 DIAGNOSIS — J441 Chronic obstructive pulmonary disease with (acute) exacerbation: Secondary | ICD-10-CM | POA: Insufficient documentation

## 2014-04-16 DIAGNOSIS — F17200 Nicotine dependence, unspecified, uncomplicated: Secondary | ICD-10-CM | POA: Insufficient documentation

## 2014-04-16 DIAGNOSIS — F3289 Other specified depressive episodes: Secondary | ICD-10-CM | POA: Insufficient documentation

## 2014-04-16 DIAGNOSIS — R5383 Other fatigue: Secondary | ICD-10-CM

## 2014-04-16 DIAGNOSIS — Z79899 Other long term (current) drug therapy: Secondary | ICD-10-CM | POA: Insufficient documentation

## 2014-04-16 DIAGNOSIS — D649 Anemia, unspecified: Secondary | ICD-10-CM | POA: Insufficient documentation

## 2014-04-16 DIAGNOSIS — R5381 Other malaise: Secondary | ICD-10-CM | POA: Insufficient documentation

## 2014-04-16 DIAGNOSIS — Z7982 Long term (current) use of aspirin: Secondary | ICD-10-CM | POA: Insufficient documentation

## 2014-04-16 DIAGNOSIS — Z794 Long term (current) use of insulin: Secondary | ICD-10-CM | POA: Insufficient documentation

## 2014-04-16 DIAGNOSIS — E119 Type 2 diabetes mellitus without complications: Secondary | ICD-10-CM | POA: Insufficient documentation

## 2014-04-16 DIAGNOSIS — M1990 Unspecified osteoarthritis, unspecified site: Secondary | ICD-10-CM | POA: Insufficient documentation

## 2014-04-16 DIAGNOSIS — I129 Hypertensive chronic kidney disease with stage 1 through stage 4 chronic kidney disease, or unspecified chronic kidney disease: Secondary | ICD-10-CM | POA: Insufficient documentation

## 2014-04-16 LAB — CBC WITH DIFFERENTIAL/PLATELET
Basophils Absolute: 0 10*3/uL (ref 0.0–0.1)
Basophils Relative: 0 % (ref 0–1)
Eosinophils Absolute: 0.3 10*3/uL (ref 0.0–0.7)
Eosinophils Relative: 5 % (ref 0–5)
HCT: 39 % (ref 36.0–46.0)
Hemoglobin: 12.4 g/dL (ref 12.0–15.0)
Lymphocytes Relative: 26 % (ref 12–46)
Lymphs Abs: 1.7 10*3/uL (ref 0.7–4.0)
MCH: 29.7 pg (ref 26.0–34.0)
MCHC: 31.8 g/dL (ref 30.0–36.0)
MCV: 93.3 fL (ref 78.0–100.0)
Monocytes Absolute: 0.5 10*3/uL (ref 0.1–1.0)
Monocytes Relative: 7 % (ref 3–12)
NEUTROS PCT: 62 % (ref 43–77)
Neutro Abs: 4.2 10*3/uL (ref 1.7–7.7)
PLATELETS: 164 10*3/uL (ref 150–400)
RBC: 4.18 MIL/uL (ref 3.87–5.11)
RDW: 16.4 % — AB (ref 11.5–15.5)
WBC: 6.7 10*3/uL (ref 4.0–10.5)

## 2014-04-16 LAB — COMPREHENSIVE METABOLIC PANEL
ALBUMIN: 3.4 g/dL — AB (ref 3.5–5.2)
ALK PHOS: 61 U/L (ref 39–117)
ALT: 22 U/L (ref 0–35)
AST: 26 U/L (ref 0–37)
BUN: 72 mg/dL — ABNORMAL HIGH (ref 6–23)
CO2: 29 mEq/L (ref 19–32)
Calcium: 9.6 mg/dL (ref 8.4–10.5)
Chloride: 99 mEq/L (ref 96–112)
Creatinine, Ser: 5.17 mg/dL — ABNORMAL HIGH (ref 0.50–1.10)
GFR calc Af Amer: 9 mL/min — ABNORMAL LOW (ref >90)
GFR calc non Af Amer: 8 mL/min — ABNORMAL LOW (ref >90)
Glucose, Bld: 56 mg/dL — ABNORMAL LOW (ref 70–99)
POTASSIUM: 3.4 meq/L — AB (ref 3.7–5.3)
SODIUM: 142 meq/L (ref 137–147)
Total Bilirubin: 0.3 mg/dL (ref 0.3–1.2)
Total Protein: 6.9 g/dL (ref 6.0–8.3)

## 2014-04-16 LAB — PRO B NATRIURETIC PEPTIDE: PRO B NATRI PEPTIDE: 2399 pg/mL — AB (ref 0–125)

## 2014-04-16 LAB — I-STAT TROPONIN, ED: Troponin i, poc: 0.04 ng/mL (ref 0.00–0.08)

## 2014-04-16 NOTE — ED Notes (Signed)
Pt w/ hx of kidney failure.  Not on dialysis.  States that she has been having generalized swelling x 2 wks.  Pt on lasix but states that she has a "build up now".

## 2014-04-16 NOTE — Discharge Instructions (Signed)
Call Dr. Justin Mend for an office appointment to discuss initiating dialysis.  Kidney Disease, Adult The kidneys are two organs that lie on either side of the spine between the middle of the back and the front of the abdomen. The kidneys:   Remove wastes and extra water from the blood.   Produce important hormones. These regulate blood pressure, help keep bones strong, and help create red blood cells.   Balance the fluids and chemicals in the blood and tissues. Kidney disease occurs when the kidneys are damaged. Kidney damage may be sudden (acute) or develop over a long period (chronic). A small amount of damage may not cause problems, but a large amount of damage may make it difficult or impossible for the kidneys to work the way they should. Early detection and treatment of kidney disease may prevent kidney damage from becoming permanent or getting worse. Some kidney diseases are curable, but most are not. Many people with kidney disease are able to control the disease and live a normal life.  TYPES OF KIDNEY DISEASE  Acute kidney injury.Acute kidney injury occurs when there is sudden damage to the kidneys.  Chronic kidney disease. Chronic kidney disease occurs when the kidneys are damaged over a long period.  End-stage kidney disease. End-stage kidney disease occurs when the kidneys are so damaged that they stop working. In end-stage kidney disease, the kidneys cannot get better. CAUSES Any condition, disease, or event that damages the kidneys may cause kidney disease. Acute kidney injury.  A problem with blood flow to the kidneys. This may be caused by:   Blood loss.   Heart disease.   Severe burns.   Liver disease.  Direct damage to the kidneys. This may be caused by:  Some medicines.   A kidney infection.   Poisoning or consuming toxic substances.   A surgical wound.   A blow to the kidney area.   A problem with urine flow. This may be caused by:   Cancer.    Kidney stones.   An enlarged prostate. Chronic kidney disease. The most common causes of chronic kidney disease are diabetes and high blood pressure (hypertension). Chronic kidney disease may also be caused by:   Diseases that cause the filtering units of the kidneys to become inflamed.   Diseases that affect the immune system.   Genetic diseases.   Medicines that damage the kidneys, such as anti-inflammatory medicines.  Poisoning or exposure to toxic substances.   A reoccurring kidney or urinary infection.   A problem with urine flow. This may be caused by:  Cancer.   Kidney stones.   An enlarged prostate in males. End-stage kidney disease. This kidney disease usually occurs when a chronic kidney disease gets worse. It may also occur after acute kidney injury.  SYMPTOMS   Swelling (edema) of the legs, ankles, or feet.   Tiredness (lethargy).   Nausea or vomiting.   Confusion.   Problems with urination, such as:   Painful or burning feeling during urination.   Decreased urine production.  Bloody urine.   Frequent urination, especially at night.  Hypertension.  Muscle twitches and cramps.   Shortness of breath.   Persistent itchiness.   Loss of appetite.  Metallic taste in the mouth.   Weakness.   Seizures.   Chest pain or pressure.   Trouble sleeping.   Headaches.   Abnormally dark or light skin.   Numbness in the hands or feet.   Easy bruising.   Frequent hiccups.  Menstruation stops. Sometimes, no symptoms are present. DIAGNOSIS  Kidney disease may be detected and diagnosed by tests, including blood, urine, imaging, or kidney biopsy tests.  TREATMENT  Acute kidney injury. Treatment of acute kidney injury varies depending on the cause and severity of the kidney damage. In mild cases, no treatment may be needed. The kidneys may heal on their own. If acute kidney injury is more severe, your caregiver  will treat the cause of the kidney damage, help the kidneys heal, and prevent complications from occurring. Severe cases may require a procedure to remove toxic wastes from the body (dialysis) or surgery to repair kidney damage. Surgery may involve:   Repair of a torn kidney.   Removal of an obstruction.  Most of the time, you will need to stay overnight at the hospital.  Chronic kidney disease. Most chronic kidney diseases cannot be cured. Treatment usually involves relieving symptoms and preventing or slowing the progression of the disease. Treatment may include:   A special diet. You may need to avoid alcohol and foods that:   Have added salt.   Are high in potassium.   Are high in protein.   Medicines. These may:   Lower blood pressure.   Relieve anemia.   Relieve swelling.   Protect the bones.  End-stage kidney disease. End-stage kidney disease is life-threatening and must be treated immediately. There are two treatments for end-stage kidney disease:   Dialysis.   Receiving a new kidney (kidney transplant). Both of these treatments have serious risks and consequences. In addition to having dialysis or a kidney transplant, you may need to take medicines to control hypertension and cholesterol and to decrease phosphorus levels in your blood. LENGTH OF ILLNESS  Acute kidney injury.The length of this disease varies greatly from person to person. Exactly how long it lasts depends on the cause of the kidney damage. Acute kidney injury may develop into chronic kidney disease or end-stage kidney disease.  Chronic kidney disease. This disease usually lasts a lifetime. Chronic kidney disease may worsen over time to become end-stage kidney disease. The time it takes for end-stage kidney disease to develop varies from person to person.  End-stage kidney disease. This disease lasts until a kidney transplant is performed. PREVENTION  Kidney disease can sometimes be  prevented. If you have diabetes, hypertension, or any other condition that may lead to kidney disease, you should try to prevent kidney disease with:   An appropriate diet.  Medicine.  Lifestyle changes. FOR MORE INFORMATION  American Association of Kidney Patients: BombTimer.gl  National Kidney Foundation: www.kidney.[place]: https://mathis.com/  [facility]: www.lifeoptions.org and www.kidneyschool.org  Document Released: 11/07/2005 Document Revised: 10/24/2012 Document Reviewed: 07/06/2012 [facility] Patient Information 2014 [place], [place].

## 2014-04-16 NOTE — ED Provider Notes (Addendum)
CSN: [id number]     Arrival date & time 04/16/14  1529 History   First MD Initiated Contact with Patient 04/16/14 1712     Chief Complaint  Patient presents with  . Leg Swelling      HPI  Patient presents with a complaint of leg swelling. History of intermittent and mild leg swelling. History of chronic progressive renal insufficiency. Has had 8 AV graft in her left arm for 4 years. Not currently on dialysis. Has not been on dialysis at anytime. Follows with Dr. Justin Mend of nephrology. Increasing swelling for the last 2 weeks. She does continue her same dose of Lasix, which is  160 mg twice a day. She produces urine at least twice a day. No change. Does not follow weight. Feels as though she has swelling in her flanks and abdomen, and her legs. Some cough for the past few weeks and she attributes to allergies. No frank PND or orthopnea. Chronic exertional dyspnea. No chest pain.  Past Medical History  Diagnosis Date  . Type II or unspecified type diabetes mellitus without mention of complication, not stated as uncontrolled   . Esophageal reflux   . Depressive disorder, not elsewhere classified   . Chronic cough   . Shortness of breath   . Hyperparathyroidism   . Colon polyposis   . HLD (hyperlipidemia)   . Renal insufficiency     function is stable at present-Dr. Justin Mend follows  . Unspecified essential hypertension   . HTN (hypertension)   . Anemia   . Anxiety   . DJD (degenerative joint disease)   . CHF (congestive heart failure)     with well preserved EF   . COPD (chronic obstructive pulmonary disease)     mild-" oxygen use 2 l/m nightly"   Past Surgical History  Procedure Laterality Date  . Insertion of dialysis catheter Left 10-03-13    3 yrs ago -Left upper arm AVGG  . Vascular surgery Left 2011    arm  . Colonoscopy w/ polypectomy  10-02-13    hx. past 6 months -2 removed  . Colonoscopy N/A 10/28/2013    Procedure: COLONOSCOPY;  Surgeon: Inda Castle, MD;  Location: [facility]  ENDOSCOPY;  Service: Endoscopy;  Laterality: N/A;  . Hot hemostasis N/A 10/28/2013    Procedure: HOT HEMOSTASIS (ARGON PLASMA COAGULATION/BICAP);  Surgeon: Inda Castle, MD;  Location: [facility];  Service: Endoscopy;  Laterality: N/A;   Family History  Problem Relation Age of Onset  . Heart disease Father   . Cancer Sister   . Hypertension    . Diabetes    . Colon cancer Neg Hx    History  Substance Use Topics  . Smoking status: Current Every Day Smoker -- 1.00 packs/day for 30 years    Types: Cigarettes    Last Attempt to Quit: 11/22/2007  . Smokeless tobacco: Never Used     Comment: 1ppd x 30 years.  restarted and smoke 3 cigarettes daily  . Alcohol Use: No   OB History   Grav Para Term Preterm Abortions TAB SAB Ect Mult Living                 Review of Systems  Constitutional: Positive for fatigue. Negative for fever, chills, diaphoresis and appetite change.  HENT: Negative for mouth sores, sore throat and trouble swallowing.   Eyes: Negative for visual disturbance.  Respiratory: Positive for cough and shortness of breath. Negative for chest tightness and wheezing.   Cardiovascular: Positive  for leg swelling. Negative for chest pain.  Gastrointestinal: Negative for nausea, vomiting, abdominal pain, diarrhea and abdominal distention.  Endocrine: Negative for polydipsia, polyphagia and polyuria.  Genitourinary: Negative for dysuria, frequency and hematuria.  Musculoskeletal: Negative for gait problem.  Skin: Negative for color change, pallor and rash.  Neurological: Positive for weakness. Negative for dizziness, syncope, light-headedness and headaches.  Hematological: Does not bruise/bleed easily.  Psychiatric/Behavioral: Negative for behavioral problems and confusion.      Allergies  Codeine  Home Medications   Prior to Admission medications   Medication Sig Start Date End Date Taking? Authorizing Provider  albuterol (PROVENTIL HFA;VENTOLIN HFA) 108 (90 BASE)  MCG/ACT inhaler Inhale 1-2 puffs into the lungs every 6 (six) hours as needed for wheezing or shortness of breath.    Historical Provider, MD  allopurinol (ZYLOPRIM) 300 MG tablet Take 300 mg by mouth daily with breakfast.     Historical Provider, MD  aspirin 81 MG tablet Take 1 tablet (81 mg total) by mouth daily. 11/07/13   Sheila Oats, MD  atorvastatin (LIPITOR) 40 MG tablet Take 40 mg by mouth daily with breakfast.    Historical Provider, MD  atorvastatin (LIPITOR) 80 MG tablet take 1 tablet by mouth once daily 02/19/14   Melissa R Smith, PA-C  calcium acetate (PHOSLO) 667 MG capsule Take 1,334 mg by mouth 3 (three) times daily with meals.  02/09/11   Historical Provider, MD  cetirizine (ZYRTEC) 10 MG tablet Take 10 mg by mouth daily.    Historical Provider, MD  Cholecalciferol 1000 UNITS tablet Take 4,000 Units by mouth daily.    Historical Provider, MD  cinacalcet (SENSIPAR) 30 MG tablet Take 30 mg by mouth daily.    Historical Provider, MD  citalopram (CELEXA) 40 MG tablet take 1 tablet by mouth once daily for MOOD 10/23/13   Unk Pinto, MD  cloNIDine (CATAPRES) 0.3 MG tablet Take 0.3 mg by mouth 2 (two) times daily. 12/15/11   Marijean Heath, NP  colchicine 0.6 MG tablet Take 1 tablet (0.6 mg total) by mouth daily. 03/17/14   Unk Pinto, MD  fluticasone (FLONASE) 50 MCG/ACT nasal spray Place 1 spray into both nostrils 2 (two) times daily.    Historical Provider, MD  furosemide (LASIX) 80 MG tablet Take 160 mg by mouth 2 (two) times daily.     Historical Provider, MD  HUMALOG KWIKPEN 100 UNIT/ML KiwkPen inject 17 to 22 units subcutaneously twice a day 03/20/14   Vicie Mutters, PA-C  LANTUS SOLOSTAR 100 UNIT/ML Solostar Pen inject 60 units subcutaneously every morning and 40 units every evening 12/16/13   Melissa R Smith, PA-C  levothyroxine (SYNTHROID, LEVOTHROID) 50 MCG tablet Take 50 mcg by mouth daily before breakfast.     Historical Provider, MD  LORazepam (ATIVAN) 2 MG  tablet Take 1/2 to 1 tablet at bedtime as needed for sleep 02/06/14 06/08/14  Unk Pinto, MD  metolazone (ZAROXOLYN) 5 MG tablet Take 5 mg by mouth every Monday, Wednesday, and Friday.     Historical Provider, MD  paricalcitol [facility]) 1 MCG capsule Take 1 mcg by mouth every Monday, Wednesday, and Friday. 12/15/11   Marijean Heath, NP  potassium chloride SA (K-DUR,KLOR-CON) 20 MEQ tablet Take 40 mEq by mouth 3 (three) times daily.  12/15/11   Marijean Heath, NP  pregabalin (LYRICA) 50 MG capsule Take 50 mg by mouth at bedtime as needed (nerve pain).     Historical Provider, MD  ranitidine (ZANTAC) 300 MG tablet  One at bedtime 02/08/13   Tanda Rockers, MD  traMADol (ULTRAM) 50 MG tablet Take 1 tablet (50 mg total) by mouth 4 (four) times daily. only as need for pain 02/06/14 06/08/14  Unk Pinto, MD  vitamin B-12 (CYANOCOBALAMIN) 1000 MCG tablet Take 1,000 mcg by mouth at bedtime.     Historical Provider, MD   BP 154/48  Pulse 68  Temp(Src) 98.3 F (36.8 C) (Oral)  Resp 18  Wt 192 lb 9.6 oz (87.363 kg)  SpO2 98% Physical Exam  Constitutional: She is oriented to person, place, and time. She appears well-developed and well-nourished. No distress.  HENT:  Head: Normocephalic.  Eyes: Conjunctivae are normal. Pupils are equal, round, and reactive to light. No scleral icterus.  Neck: Normal range of motion. Neck supple. No thyromegaly present.  No JVD at 45.  Cardiovascular: Normal rate and regular rhythm.  Exam reveals no gallop and no friction rub.   No murmur heard. Sinus rhythm. Regular. No ST or for gallop.  Pulmonary/Chest: Effort normal and breath sounds normal. No respiratory distress. She has no wheezes. She has no rales.  Clear lungs. No crackles or rales.  Abdominal: Soft. Bowel sounds are normal. She exhibits no distension. There is no tenderness. There is no rebound.  Musculoskeletal: Normal range of motion.  Neurological: She is alert and oriented to person,  place, and time.  Skin: Skin is warm and dry. No rash noted.     Trace bilateral lower extremity edema. No presacral or trunk edema noted.  Psychiatric: She has a normal mood and affect. Her behavior is normal.    ED Course  Procedures (including critical care time) Labs Review Labs Reviewed  CBC WITH DIFFERENTIAL - Abnormal; Notable for the following:    RDW 16.4 (*)    All other components within normal limits  COMPREHENSIVE METABOLIC PANEL - Abnormal; Notable for the following:    Potassium 3.4 (*)    Glucose, Bld 56 (*)    BUN 72 (*)    Creatinine, Ser 5.17 (*)    Albumin 3.4 (*)    GFR calc non Af Amer 8 (*)    GFR calc Af Amer 9 (*)    All other components within normal limits  PRO B NATRIURETIC PEPTIDE - Abnormal; Notable for the following:    Pro B Natriuretic peptide (BNP) 2399.0 (*)    All other components within normal limits  I-STAT TROPOININ, ED    Imaging Review No results found.   EKG Interpretation   Date/Time:  Wednesday Apr 16 2014 17:38:00 EDT Ventricular Rate:  63 PR Interval:  154 QRS Duration: 94 QT Interval:  462 QTC Calculation: 473 R Axis:   17 Text Interpretation:  Sinus rhythm Left ventricular hypertrophy Confirmed  by Jeneen Rinks  MD, [place] ([id number]) on 04/17/2014 12:35:37 AM      MDM   Final diagnoses:  Renal failure    I reevaluated Mrs. Vanderveer multiple times while waiting for completion of diagnostics. Her chest x-ray suggests a trace of fluid. Clinically she shows no congestive heart failure. Potassium is 3.4. Both times I reexamined her she is laying supine once a sleeping as saturations 97% and is not dyspneic. Discussed case with nephrology on-call for Dr. Justin Mend. I do not see indications for admission at this time. Her weight today is within 1 pound of her most recent weight in March. Felt at this point with her increasing creatinine and she is very likely approaching dialysis. Nephrology would like her  evaluated in the office and asked  her to call for an appointment tomorrow. She is perfectly appropriate for outpatient treatment tonight.    Tanna Furry, MD 04/16/14 2148  Tanna Furry, MD 04/21/14 [phone]

## 2014-04-17 ENCOUNTER — Encounter: Payer: Self-pay | Admitting: Emergency Medicine

## 2014-04-17 ENCOUNTER — Other Ambulatory Visit: Payer: Self-pay | Admitting: Internal Medicine

## 2014-04-17 ENCOUNTER — Ambulatory Visit (INDEPENDENT_AMBULATORY_CARE_PROVIDER_SITE_OTHER): Payer: Medicare Other | Admitting: Emergency Medicine

## 2014-04-17 VITALS — BP 150/68 | HR 73 | Ht 64.0 in | Wt 195.0 lb

## 2014-04-17 DIAGNOSIS — R0989 Other specified symptoms and signs involving the circulatory and respiratory systems: Secondary | ICD-10-CM

## 2014-04-17 DIAGNOSIS — R05 Cough: Secondary | ICD-10-CM | POA: Insufficient documentation

## 2014-04-17 DIAGNOSIS — R0600 Dyspnea, unspecified: Secondary | ICD-10-CM

## 2014-04-17 DIAGNOSIS — R0609 Other forms of dyspnea: Secondary | ICD-10-CM

## 2014-04-17 DIAGNOSIS — R059 Cough, unspecified: Secondary | ICD-10-CM

## 2014-04-17 DIAGNOSIS — R053 Chronic cough: Secondary | ICD-10-CM | POA: Insufficient documentation

## 2014-04-17 MED ORDER — HYDROCOD POLST-CHLORPHEN POLST 10-8 MG/5ML PO LQCR: 5.0000 mL | Freq: Two times a day (BID) | ORAL | Status: DC | PRN

## 2014-04-17 NOTE — Assessment & Plan Note (Signed)
Contributions of GERD and PND, but has always still had cough no matter how we have treated these. Her compliance has some gaps.  - continue same meds - add tussionex for symptomatic relief - advised her to put head of bed up

## 2014-04-17 NOTE — Patient Instructions (Signed)
Please continue to take your zantac, loratadine and fluticasone nasal spray We will use tussionex cough syrup every 12 hours as needed for your cough Follow with Dr Justin Mend regarding your weight gain and it's contribution to your breathing difficulty. i believe you will need to have fluid removed to help your breathing.  Follow with Dr Lamonte Sakai in 4 months or sooner if you have any problems.

## 2014-04-17 NOTE — Progress Notes (Signed)
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  Chronic renal failure   ROV 07/22/10 -- Hx mild COPD, CHF, chronic cough. Presented last time with refractory cough - Dr Melvyn Novas held Spiriva, fexofenadine, atenolol. New b-b;locker per cards is coreg. Drainage much improved with chlorphenerimine. Cough is much improved. She doesn't miss the Spiriva. She was started on supplimental O2 during hosp admission last week.   ROV 03/22/11 -- mixed disease on PFT, hx CHF, chronic cough. Has been taking OTC decongestants without much relief of congestion, sore throat, cough. Started taking loratadine last week. Has been doing nasal saline washes prn.   ROV 04/20/11 - Acute OV, mixed disease, chronic cough. Last time we treated allergies - NSW, nasal steroid, loratadine for flare of coughing. Still takes Nexium. She reports that her cough eased up a bit for a few days, but then went back to same daily cough, productive of clear mucous. Seems to be worse at night. She has some sinus pain. >>tx w/ claritin and nasal rinses and flonase   ROV 05/05/11 Acute OV  Pt presents for an acute office visit. Complains of persistent cough, congestion and drainage. Cough is keeping her up night.  Using no meds for cough. Mucus is yellow and white. Mucus is very thick.  CT sinus done last ov was neg for acute changes.  Cough and congestion are getting worse.   ROV 05/12/11 --former tobacco, mixed dz on Spirio, chronic cough with allergic rhinitis. She was seen last week with refractory cough, prod of tan mucous, treated for acute bronchitis and is now improved. She remains on loratadine + flonase + [place]. Also on nexium bid. Not on any inhaled medications. She is preparing to get cataract   ROV 07/11/11 -- COPD/mixed disease, chronic cough and allergies. She has been coughing worse for over 2 weeks. More nasal congestion, more pharyngeal drainage, sore throat. Has been taking her fluticasone,  loratadine, NSW's. She tells me that since last time she has been admitted to hospital for CHF exacerbation. No fevers, no purulent secretions. Still on nexium bid.   ROV 08/23/11 -- COPD, coexisting restriction, chronic cough, allergies. Last time changed fluticasone to astopro, continued NSW + loratadine = nexium. Also rx with azithro x 5 days. Still w PND  ROV 01/09/12 -- COPD, coexisting restriction, chronic cough, allergies. Was just hospitalized for CP and gout. She continues to take Nexium daily, allergy regimen >> NSW, Nasal steroid, loratadine. She uses albuterol very rarely.   Acute OV 05/22/12--  Complains of head congestion, clear nasal drainage, occ prod cough with white/clear mucus x2 weeks . Cough is severe at times. -takes her breath she coughs so hard.  Has a lot of drainage throat.  No fever or chest pain .  No edema.   ROV 07/10/12 -- COPD, coexisting restriction, chronic cough, allergies.  She has been having allergic sx, sneezing, cough, worst when she is supine since July. Her GERD is controlled on Nexium - has gone back to taking every day. She is on her NSW, loratadine, fluticasone. In July took doxy + pred + hydromet. Returns with similar sx.    08/28/2012 Acute OV  Complains of prod cough with thick white mucus, upper airway cough, occ SOB - states no better since last ov.  Continues to have cough that rattles all the the time w/ thick mucus.  Mild dyspnea. Some sinus drainage and stuffiness.  Seen 6 weeks ago, cont on cough control measure  with GERD /Rhinitis prevention.  Says it is not helping her.  No hemotpysis, abd pain, vomitting, or fever.  CXR today with no acute process.  rec Levaquin 500mg  daily for 7 days -take with food.  Prednisone taper over next week. - monitor sugars, call if >200.  Mucinex DM Twice daily  As needed  Cough/congestion  Hydromet 1-2 tsp every 4-6 hr As needed  Cough-may make you sleepy I will call with xray results .  Begin Zyrtec 10mg   in am . Stop clairtin  Begin Chlortrimeton 4mg  2 At bedtime   Continue on Zantac Twice daily     02/08/2013 acute ov cough back x 2 weeks on prevacid 15 bid and added mucinex and 1st h1 but continued cough worst at hs, dry, occ albuterol does help but most of time not.   >>prednisone taper.   Acute OV 06/12/13 Complains of head congestion, runny nose, PND, chest congestion, prod cough with thick white mucus x3 weeks - denies f/c/s, SOB, wheezing, tightness.  Has taken being taking saline washes, mucinex dm without much help. Tessalon is not helping.  Cough keeping her up at night. Coughs all the time.  No fever, chest pain, hemoptysis.  Coughing up thick white mucus.  Seen at PCP 2 weeks ago, given Zpack without any help. Has sinus pain and pressure.   ROV 07/19/13 -- follow up, chronic cough, mixed disease on spiro, GERD + PND. Treated in 7/'14 with augmentin + pred taper. She on zyrtec + fluticasone bid. She has albuterol available prn. She is working on getting on [facility] renal tx list. 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 Needs oximetry today.   Acute OV 04/17/14 -- hx tobacco and mixed disease, GERD / PND / cough, renal failure. She has been having more edema, swelling of her abdomen, difficulty taking a deep breath. She has more cough than usual, more than her baseline. Clear mucous. She is getting ready to start HD. Lots of nasal drainage. She is taking zyrtec and fluticasone, zantac. PPI in the past has caused diarrhea.     EXAM :   Filed Vitals:   04/17/14 1452  BP: 150/68  Pulse: 73  Height: 5\' 4"  (1.626 m)  Weight: 195 lb (88.451 kg)  SpO2: 96%   Gen: Pleasant, obese, in no distress,  normal affect  ENT: No lesions,  mouth clear,  oropharynx clear, posterior pharynx red  Neck: No JVD, no TMG, no carotid bruits  Lungs: No use of accessory muscles, clear B   Cardiovascular: RRR, heart sounds normal, no murmur or gallops, no peripheral edema  Musculoskeletal:  No deformities, no cyanosis or clubbing  Neuro: alert, non focal  Skin: Warm, no lesions or rashes   Chronic cough Contributions of GERD and PND, but has always still had cough no matter how we have treated these. Her compliance has some gaps.  - continue same meds - add tussionex for symptomatic relief - advised her to put head of bed up  Dyspnea Believe this is largely restrictive and related to her abd bloating and edema. She cannot take a deep breath. Suspect relates to her 10 lb wt gain over the last month. She will need to start HD soon, is following with Dr Justin Mend

## 2014-04-17 NOTE — Assessment & Plan Note (Signed)
Believe this is largely restrictive and related to her abd bloating and edema. She cannot take a deep breath. Suspect relates to her 10 lb wt gain over the last month. She will need to start HD soon, is following with Dr Justin Mend

## 2014-05-09 ENCOUNTER — Ambulatory Visit: Payer: Self-pay | Admitting: Physician Assistant

## 2014-06-12 ENCOUNTER — Other Ambulatory Visit: Payer: Self-pay | Admitting: *Deleted

## 2014-07-11 ENCOUNTER — Other Ambulatory Visit: Payer: Self-pay | Admitting: Internal Medicine

## 2014-07-11 MED ORDER — ATORVASTATIN CALCIUM 80 MG PO TABS: 80.0000 mg | ORAL_TABLET | Freq: Every day | ORAL | Status: DC

## 2014-07-11 NOTE — Telephone Encounter (Signed)
Received request from [facility] for 80 mg Atorvastatin , patients chart states 40 mg, called patient and verified that she is taking 80 mg daily and refilled prescription to [facility] [employer]

## 2014-07-17 ENCOUNTER — Other Ambulatory Visit: Payer: Self-pay | Admitting: Internal Medicine

## 2014-08-11 ENCOUNTER — Other Ambulatory Visit: Payer: Self-pay | Admitting: *Deleted

## 2014-08-11 MED ORDER — INSULIN GLARGINE 100 UNIT/ML SOLOSTAR PEN: PEN_INJECTOR | SUBCUTANEOUS | Status: DC

## 2014-08-11 MED ORDER — INSULIN GLARGINE 100 UNIT/ML ~~LOC~~ SOLN: 20.0000 [IU] | SUBCUTANEOUS | Status: DC

## 2014-08-13 ENCOUNTER — Encounter: Payer: Self-pay | Admitting: Internal Medicine

## 2014-08-13 ENCOUNTER — Ambulatory Visit (INDEPENDENT_AMBULATORY_CARE_PROVIDER_SITE_OTHER): Payer: Medicare Other | Admitting: Internal Medicine

## 2014-08-13 DIAGNOSIS — R69 Illness, unspecified: Secondary | ICD-10-CM

## 2014-08-13 NOTE — Progress Notes (Signed)
Patient ID: [name], female   DOB: [birth year], 68 y.o.   MRN: [id number]    [name]

## 2014-08-21 ENCOUNTER — Encounter: Payer: Self-pay | Admitting: Podiatry

## 2014-08-21 ENCOUNTER — Ambulatory Visit (INDEPENDENT_AMBULATORY_CARE_PROVIDER_SITE_OTHER): Payer: Medicare Other | Admitting: Podiatry

## 2014-08-21 ENCOUNTER — Encounter: Payer: Self-pay | Admitting: Gastroenterology

## 2014-08-21 VITALS — BP 150/64 | HR 68 | Resp 16

## 2014-08-21 DIAGNOSIS — M79673 Pain in unspecified foot: Secondary | ICD-10-CM | POA: Diagnosis not present

## 2014-08-21 DIAGNOSIS — B351 Tinea unguium: Secondary | ICD-10-CM | POA: Diagnosis not present

## 2014-08-21 NOTE — Patient Instructions (Signed)
Diabetes and Foot Care Diabetes may cause you to have problems because of poor blood supply (circulation) to your feet and legs. This may cause the skin on your feet to become thinner, break easier, and heal more slowly. Your skin may become dry, and the skin may peel and crack. You may also have nerve damage in your legs and feet causing decreased feeling in them. You may not notice minor injuries to your feet that could lead to infections or more serious problems. Taking care of your feet is one of the most important things you can do for yourself.  HOME CARE INSTRUCTIONS  Wear shoes at all times, even in the house. Do not go barefoot. Bare feet are easily injured.  Check your feet daily for blisters, cuts, and redness. If you cannot see the bottom of your feet, use a mirror or ask someone for help.  Wash your feet with warm water (do not use hot water) and mild soap. Then pat your feet and the areas between your toes until they are completely dry. Do not soak your feet as this can dry your skin.  Apply a moisturizing lotion or petroleum jelly (that does not contain alcohol and is unscented) to the skin on your feet and to dry, brittle toenails. Do not apply lotion between your toes.  Trim your toenails straight across. Do not dig under them or around the cuticle. File the edges of your nails with an emery board or nail file.  Do not cut corns or calluses or try to remove them with medicine.  Wear clean socks or stockings every day. Make sure they are not too tight. Do not wear knee-high stockings since they may decrease blood flow to your legs.  Wear shoes that fit properly and have enough cushioning. To break in new shoes, wear them for just a few hours a day. This prevents you from injuring your feet. Always look in your shoes before you put them on to be sure there are no objects inside.  Do not cross your legs. This may decrease the blood flow to your feet.  If you find a minor scrape,  cut, or break in the skin on your feet, keep it and the skin around it clean and dry. These areas may be cleansed with mild soap and water. Do not cleanse the area with peroxide, alcohol, or iodine.  When you remove an adhesive bandage, be sure not to damage the skin around it.  If you have a wound, look at it several times a day to make sure it is healing.  Do not use heating pads or hot water bottles. They may burn your skin. If you have lost feeling in your feet or legs, you may not know it is happening until it is too late.  Make sure your health care provider performs a complete foot exam at least annually or more often if you have foot problems. Report any cuts, sores, or bruises to your health care provider immediately. SEEK MEDICAL CARE IF:   You have an injury that is not healing.  You have cuts or breaks in the skin.  You have an ingrown nail.  You notice redness on your legs or feet.  You feel burning or tingling in your legs or feet.  You have pain or cramps in your legs and feet.  Your legs or feet are numb.  Your feet always feel cold. SEEK IMMEDIATE MEDICAL CARE IF:   There is increasing redness,   swelling, or pain in or around a wound.  There is a red line that goes up your leg.  Pus is coming from a wound.  You develop a fever or as directed by your health care provider.  You notice a bad smell coming from an ulcer or wound. Document Released: 11/04/2000 Document Revised: 07/10/2013 Document Reviewed: 04/16/2013 Exit[employer] Patient Information 2015 [employer]. This information is not intended to replace advice given to you by your health care provider. Make sure you discuss any questions you have with your health care provider.  

## 2014-08-21 NOTE — Progress Notes (Signed)
   Subjective:    Patient ID: [name], female    DOB: [birth year], 68 y.o.   MRN: [id number]  HPI Comments: "I need the toenails cut"  Patient states that she needs to get her toenails cut.     Review of Systems  HENT: Positive for hearing loss, sinus pressure, sneezing and tinnitus.   Respiratory: Positive for choking, shortness of breath and wheezing.   Cardiovascular: Positive for leg swelling.  Gastrointestinal: Positive for constipation.  Musculoskeletal: Positive for myalgias.  Neurological: Positive for numbness.  Psychiatric/Behavioral: The patient is nervous/anxious.   All other systems reviewed and are negative.      Objective:   Physical Exam        Assessment & Plan:

## 2014-08-24 NOTE — Progress Notes (Signed)
Subjective:     Patient ID: [name], female   DOB: 12/19/1945, 68 y.o.   MRN: [id number]  HPI patient presents with nail disease 1-5 both feet that are painful and she cannot cut   Review of Systems     Objective:   Physical Exam Neurovascular status intact with thick yellow brittle nailbeds 1-5 both feet    Assessment:     Mycotic nail infection with pain 1-5 both feet    Plan:     Debridement painful nailbeds 1-5 both feet with no iatrogenic bleeding noted      

## 2014-09-03 ENCOUNTER — Ambulatory Visit (INDEPENDENT_AMBULATORY_CARE_PROVIDER_SITE_OTHER): Payer: Medicare Other | Admitting: Emergency Medicine

## 2014-09-03 ENCOUNTER — Other Ambulatory Visit: Payer: Self-pay

## 2014-09-03 ENCOUNTER — Encounter: Payer: Self-pay | Admitting: Emergency Medicine

## 2014-09-03 VITALS — BP 138/74 | HR 76 | Temp 97.5°F | Ht 64.0 in | Wt 191.4 lb

## 2014-09-03 DIAGNOSIS — Z1231 Encounter for screening mammogram for malignant neoplasm of breast: Secondary | ICD-10-CM

## 2014-09-03 DIAGNOSIS — R05 Cough: Secondary | ICD-10-CM

## 2014-09-03 DIAGNOSIS — R053 Chronic cough: Secondary | ICD-10-CM

## 2014-09-03 MED ORDER — HYDROCODONE-HOMATROPINE 5-1.5 MG/5ML PO SYRP: 5.0000 mL | ORAL_SOLUTION | Freq: Four times a day (QID) | ORAL | Status: DC | PRN

## 2014-09-03 MED ORDER — PREDNISONE 10 MG PO TABS: ORAL_TABLET | ORAL | Status: DC

## 2014-09-03 NOTE — Assessment & Plan Note (Signed)
Flaring due to nasal gtt and allergy season. Reviewed the contributing causes with her.   We will use prednisone 20mg  daily for 5 days, the 10mg  daily for 5 days, then stop Continue zyrtec -d, fluticasone nasal spray, zantac Take the fluticasone at least an hour before bedtime Use hycodan up to every 6 hours if needed for cough Continue ultram as needed.  Try to practice voice rest if at all possible Follow with Dr Lamonte Sakai in 1 month

## 2014-09-03 NOTE — Progress Notes (Signed)
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  Chronic renal failure   ROV 07/22/10 -- Hx mild COPD, CHF, chronic cough. Presented last time with refractory cough - Dr Melvyn Novas held Spiriva, fexofenadine, atenolol. New b-b;locker per cards is coreg. Drainage much improved with chlorphenerimine. Cough is much improved. She doesn't miss the Spiriva. She was started on supplimental O2 during hosp admission last week.   ROV 03/22/11 -- mixed disease on PFT, hx CHF, chronic cough. Has been taking OTC decongestants without much relief of congestion, sore throat, cough. Started taking loratadine last week. Has been doing nasal saline washes prn.   ROV 04/20/11 - Acute OV, mixed disease, chronic cough. Last time we treated allergies - NSW, nasal steroid, loratadine for flare of coughing. Still takes Nexium. She reports that her cough eased up a bit for a few days, but then went back to same daily cough, productive of clear mucous. Seems to be worse at night. She has some sinus pain. >>tx w/ claritin and nasal rinses and flonase   ROV 05/05/11 Acute OV  Pt presents for an acute office visit. Complains of persistent cough, congestion and drainage. Cough is keeping her up night.  Using no meds for cough. Mucus is yellow and white. Mucus is very thick.  CT sinus done last ov was neg for acute changes.  Cough and congestion are getting worse.   ROV 05/12/11 --former tobacco, mixed dz on Spirio, chronic cough with allergic rhinitis. She was seen last week with refractory cough, prod of tan mucous, treated for acute bronchitis and is now improved. She remains on loratadine + flonase + [place]. Also on nexium bid. Not on any inhaled medications. She is preparing to get cataract   ROV 07/11/11 -- COPD/mixed disease, chronic cough and allergies. She has been coughing worse for over 2 weeks. More nasal congestion, more pharyngeal drainage, sore throat. Has been taking her fluticasone,  loratadine, NSW's. She tells me that since last time she has been admitted to hospital for CHF exacerbation. No fevers, no purulent secretions. Still on nexium bid.   ROV 08/23/11 -- COPD, coexisting restriction, chronic cough, allergies. Last time changed fluticasone to astopro, continued NSW + loratadine = nexium. Also rx with azithro x 5 days. Still w PND  ROV 01/09/12 -- COPD, coexisting restriction, chronic cough, allergies. Was just hospitalized for CP and gout. She continues to take Nexium daily, allergy regimen >> NSW, Nasal steroid, loratadine. She uses albuterol very rarely.   Acute OV 05/22/12--  Complains of head congestion, clear nasal drainage, occ prod cough with white/clear mucus x2 weeks . Cough is severe at times. -takes her breath she coughs so hard.  Has a lot of drainage throat.  No fever or chest pain .  No edema.   ROV 07/10/12 -- COPD, coexisting restriction, chronic cough, allergies.  She has been having allergic sx, sneezing, cough, worst when she is supine since July. Her GERD is controlled on Nexium - has gone back to taking every day. She is on her NSW, loratadine, fluticasone. In July took doxy + pred + hydromet. Returns with similar sx.    08/28/2012 Acute OV  Complains of prod cough with thick white mucus, upper airway cough, occ SOB - states no better since last ov.  Continues to have cough that rattles all the the time w/ thick mucus.  Mild dyspnea. Some sinus drainage and stuffiness.  Seen 6 weeks ago, cont on cough control measure  with GERD /Rhinitis prevention.  Says it is not helping her.  No hemotpysis, abd pain, vomitting, or fever.  CXR today with no acute process.  rec Levaquin 500mg  daily for 7 days -take with food.  Prednisone taper over next week. - monitor sugars, call if >200.  Mucinex DM Twice daily  As needed  Cough/congestion  Hydromet 1-2 tsp every 4-6 hr As needed  Cough-may make you sleepy I will call with xray results .  Begin Zyrtec 10mg   in am . Stop clairtin  Begin Chlortrimeton 4mg  2 At bedtime   Continue on Zantac Twice daily     02/08/2013 acute ov cough back x 2 weeks on prevacid 15 bid and added mucinex and 1st h1 but continued cough worst at hs, dry, occ albuterol does help but most of time not.   >>prednisone taper.   Acute OV 06/12/13 Complains of head congestion, runny nose, PND, chest congestion, prod cough with thick white mucus x3 weeks - denies f/c/s, SOB, wheezing, tightness.  Has taken being taking saline washes, mucinex dm without much help. Tessalon is not helping.  Cough keeping her up at night. Coughs all the time.  No fever, chest pain, hemoptysis.  Coughing up thick white mucus.  Seen at PCP 2 weeks ago, given Zpack without any help. Has sinus pain and pressure.   ROV 07/19/13 -- follow up, chronic cough, mixed disease on spiro, GERD + PND. Treated in 7/'14 with augmentin + pred taper. She on zyrtec + fluticasone bid. She has albuterol available prn. She is working on getting on [facility] tx list. 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 Needs oximetry today.   Acute OV 04/17/14 -- hx tobacco and mixed disease, GERD / PND / cough, renal failure. She has been having more edema, swelling of her abdomen, difficulty taking a deep breath. She has more cough than usual, more than her baseline. Clear mucous. She is getting ready to start HD. Lots of nasal drainage. She is taking zyrtec and fluticasone, zantac. PPI in the past has caused diarrhea.   ROV 09/03/14 -- follow up visit for tobacco and mixed disease, GERD / PND / cough, renal failure she has been doing fairly well. Then started to have clear nasal drainage and cough, barking with clear mucous production. Worse at night. She is on zyrtec-D and fluticasone bid. She is on zantac qhs.    EXAM :   Filed Vitals:   09/03/14 1700  BP: 138/74  Pulse: 76  Temp: 97.5 F (36.4 C)  TempSrc: Oral  Height: 5\' 4"  (1.626 m)  Weight: 191 lb 6.4 oz  (86.818 kg)  SpO2: 99%   Gen: Pleasant, obese, in no distress,  normal affect  ENT: No lesions,  mouth clear,  oropharynx clear, posterior pharynx red  Neck: No JVD, no TMG, no carotid bruits  Lungs: No use of accessory muscles, clear B   Cardiovascular: RRR, heart sounds normal, no murmur or gallops, no peripheral edema  Musculoskeletal: No deformities, no cyanosis or clubbing  Neuro: alert, non focal  Skin: Warm, no lesions or rashes   Chronic cough Flaring due to nasal gtt and allergy season. Reviewed the contributing causes with her.   We will use prednisone 20mg  daily for 5 days, the 10mg  daily for 5 days, then stop Continue zyrtec -d, fluticasone nasal spray, zantac Take the fluticasone at least an hour before bedtime Use hycodan up to every 6 hours if needed for  cough Continue ultram as needed.  Try to practice voice rest if at all possible Follow with Dr Lamonte Sakai in 1 month

## 2014-09-03 NOTE — Patient Instructions (Addendum)
We will use prednisone 20mg  daily for 5 days, the 10mg  daily for 5 days, then stop Continue zyrtec -d, fluticasone nasal spray, zantac Take the fluticasone at least an hour before bedtime Use hycodan up to every 6 hours if needed for cough Continue ultram as needed.  Try to practice voice rest if at all possible Follow with Dr Lamonte Sakai in 1 month

## 2014-09-08 ENCOUNTER — Other Ambulatory Visit: Payer: Self-pay | Admitting: Internal Medicine

## 2014-09-25 ENCOUNTER — Ambulatory Visit: Payer: Medicare Other

## 2014-10-09 ENCOUNTER — Encounter: Payer: Self-pay | Admitting: Emergency Medicine

## 2014-10-09 ENCOUNTER — Ambulatory Visit: Payer: Medicare Other | Admitting: Emergency Medicine

## 2014-10-09 VITALS — BP 148/60 | HR 95 | Ht 64.0 in | Wt 186.0 lb

## 2014-10-09 DIAGNOSIS — J449 Chronic obstructive pulmonary disease, unspecified: Secondary | ICD-10-CM

## 2014-10-09 MED ORDER — HYDROCODONE-HOMATROPINE 5-1.5 MG/5ML PO SYRP: 5.0000 mL | ORAL_SOLUTION | Freq: Four times a day (QID) | ORAL | Status: DC | PRN

## 2014-10-09 NOTE — Assessment & Plan Note (Signed)
She has improved significantly since last time after treatment with prednisone. Most of her symptoms are upper airway in nature. She does have albuterol to use as needed. We will continue to treat her underlying contributors including allergic rhinitis and GERD  Please continue your Zyrtec, fluticasone nasal spray, and Zantac as you have been taking them We will refill her Hycodan cough syrup to be used as needed Use albuterol as needed Follow with Dr Lamonte Sakai in 4 months or sooner if you have any problems.

## 2014-10-09 NOTE — Progress Notes (Signed)
68 yobf quit smoking after hospitalization [facility] 2009, mixed dz by PFT, chronic cough.  Chronic renal failure   ROV 07/22/10 -- Hx mild COPD, CHF, chronic cough. Presented last time with refractory cough - Dr Melvyn Novas held Spiriva, fexofenadine, atenolol. New b-b;locker per cards is coreg. Drainage much improved with chlorphenerimine. Cough is much improved. She doesn't miss the Spiriva. She was started on supplimental O2 during hosp admission last week.   ROV 03/22/11 -- mixed disease on PFT, hx CHF, chronic cough. Has been taking OTC decongestants without much relief of congestion, sore throat, cough. Started taking loratadine last week. Has been doing nasal saline washes prn.   ROV 04/20/11 - Acute OV, mixed disease, chronic cough. Last time we treated allergies - NSW, nasal steroid, loratadine for flare of coughing. Still takes Nexium. She reports that her cough eased up a bit for a few days, but then went back to same daily cough, productive of clear mucous. Seems to be worse at night. She has some sinus pain. >>tx w/ claritin and nasal rinses and flonase   ROV 05/05/11 Acute OV  Pt presents for an acute office visit. Complains of persistent cough, congestion and drainage. Cough is keeping her up night.  Using no meds for cough. Mucus is yellow and white. Mucus is very thick.  CT sinus done last ov was neg for acute changes.  Cough and congestion are getting worse.   ROV 05/12/11 --former tobacco, mixed dz on Spirio, chronic cough with allergic rhinitis. She was seen last week with refractory cough, prod of tan mucous, treated for acute bronchitis and is now improved. She remains on loratadine + flonase + [place]. Also on nexium bid. Not on any inhaled medications. She is preparing to get cataract   ROV 07/11/11 -- COPD/mixed disease, chronic cough and allergies. She has been coughing worse for over 2 weeks. More nasal congestion, more pharyngeal drainage, sore throat. Has been taking her fluticasone,  loratadine, NSW's. She tells me that since last time she has been admitted to hospital for CHF exacerbation. No fevers, no purulent secretions. Still on nexium bid.   ROV 08/23/11 -- COPD, coexisting restriction, chronic cough, allergies. Last time changed fluticasone to astopro, continued NSW + loratadine = nexium. Also rx with azithro x 5 days. Still w PND  ROV 01/09/12 -- COPD, coexisting restriction, chronic cough, allergies. Was just hospitalized for CP and gout. She continues to take Nexium daily, allergy regimen >> NSW, Nasal steroid, loratadine. She uses albuterol very rarely.   Acute OV 05/22/12--  Complains of head congestion, clear nasal drainage, occ prod cough with white/clear mucus x2 weeks . Cough is severe at times. -takes her breath she coughs so hard.  Has a lot of drainage throat.  No fever or chest pain .  No edema.   ROV 07/10/12 -- COPD, coexisting restriction, chronic cough, allergies.  She has been having allergic sx, sneezing, cough, worst when she is supine since July. Her GERD is controlled on Nexium - has gone back to taking every day. She is on her NSW, loratadine, fluticasone. In July took doxy + pred + hydromet. Returns with similar sx.    08/28/2012 Acute OV  Complains of prod cough with thick white mucus, upper airway cough, occ SOB - states no better since last ov.  Continues to have cough that rattles all the the time w/ thick mucus.  Mild dyspnea. Some sinus drainage and stuffiness.  Seen 6 weeks ago, cont on cough control measure  with GERD /Rhinitis prevention.  Says it is not helping her.  No hemotpysis, abd pain, vomitting, or fever.  CXR today with no acute process.  rec Levaquin 500mg  daily for 7 days -take with food.  Prednisone taper over next week. - monitor sugars, call if >200.  Mucinex DM Twice daily  As needed  Cough/congestion  Hydromet 1-2 tsp every 4-6 hr As needed  Cough-may make you sleepy I will call with xray results .  Begin Zyrtec 10mg   in am . Stop clairtin  Begin Chlortrimeton 4mg  2 At bedtime   Continue on Zantac Twice daily     02/08/2013 acute ov cough back x 2 weeks on prevacid 15 bid and added mucinex and 1st h1 but continued cough worst at hs, dry, occ albuterol does help but most of time not.   >>prednisone taper.   Acute OV 06/12/13 Complains of head congestion, runny nose, PND, chest congestion, prod cough with thick white mucus x3 weeks - denies f/c/s, SOB, wheezing, tightness.  Has taken being taking saline washes, mucinex dm without much help. Tessalon is not helping.  Cough keeping her up at night. Coughs all the time.  No fever, chest pain, hemoptysis.  Coughing up thick white mucus.  Seen at PCP 2 weeks ago, given Zpack without any help. Has sinus pain and pressure.   ROV 07/19/13 -- follow up, chronic cough, mixed disease on spiro, GERD + PND. Treated in 7/'14 with augmentin + pred taper. She on zyrtec + fluticasone bid. She has albuterol available prn. She is working on getting on [facility] tx list. 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 Needs oximetry today.   Acute OV 04/17/14 -- hx tobacco and mixed disease, GERD / PND / cough, renal failure. She has been having more edema, swelling of her abdomen, difficulty taking a deep breath. She has more cough than usual, more than her baseline. Clear mucous. She is getting ready to start HD. Lots of nasal drainage. She is taking zyrtec and fluticasone, zantac. PPI in the past has caused diarrhea.   ROV 09/03/14 -- follow up visit for tobacco and mixed disease, GERD / PND / cough, renal failure she has been doing fairly well. Then started to have clear nasal drainage and cough, barking with clear mucous production. Worse at night. She is on zyrtec-D and fluticasone bid. She is on zantac qhs.   ROV 10/09/14 -- follow-up visit for upper airway disease, chronic cough, and mixed restriction and  obstruction.  She is much improved from last visit when we  treated an exacerbation. She is coughing a few times a day, white sputum. Her breathing has improved along with the UA sx. She wants to have hycodan available to use as needed.    EXAM :   Filed Vitals:   10/09/14 1614  BP: 148/60  Pulse: 95  Height: 5\' 4"  (1.626 m)  Weight: 186 lb (84.369 kg)  SpO2: 98%   Gen: Pleasant, obese, in no distress,  normal affect  ENT: No lesions,  mouth clear,  oropharynx clear, posterior pharynx red  Neck: No JVD, no TMG, no carotid bruits  Lungs: No use of accessory muscles, clear B   Cardiovascular: RRR, heart sounds normal, no murmur or gallops, no peripheral edema  Musculoskeletal: No deformities, no cyanosis or clubbing  Neuro: alert, non focal  Skin: Warm, no lesions or rashes   COPD (chronic obstructive pulmonary disease) She has improved significantly since last  time after treatment with prednisone. Most of her symptoms are upper airway in nature. She does have albuterol to use as needed. We will continue to treat her underlying contributors including allergic rhinitis and GERD  Please continue your Zyrtec, fluticasone nasal spray, and Zantac as you have been taking them We will refill her Hycodan cough syrup to be used as needed Use albuterol as needed Follow with Dr Lamonte Sakai in 4 months or sooner if you have any problems.

## 2014-10-09 NOTE — Patient Instructions (Addendum)
                                                       Please continue your Zyrtec, fluticasone nasal spray, and Zantac as you have been taking them We will refill her Hycodan cough syrup to be used as needed Use albuterol as needed Follow with Dr Lamonte Sakai in 4 months or sooner if you have any problems.

## 2014-11-04 ENCOUNTER — Other Ambulatory Visit: Payer: Self-pay | Admitting: Internal Medicine

## 2014-11-05 ENCOUNTER — Other Ambulatory Visit: Payer: Self-pay | Admitting: Nurse Practitioner

## 2014-11-07 ENCOUNTER — Other Ambulatory Visit: Payer: Self-pay | Admitting: Physician Assistant

## 2014-11-15 ENCOUNTER — Encounter: Payer: Self-pay | Admitting: *Deleted

## 2014-12-01 ENCOUNTER — Ambulatory Visit (INDEPENDENT_AMBULATORY_CARE_PROVIDER_SITE_OTHER): Payer: Medicare Other | Admitting: Podiatry

## 2014-12-01 DIAGNOSIS — B351 Tinea unguium: Secondary | ICD-10-CM

## 2014-12-01 DIAGNOSIS — M79673 Pain in unspecified foot: Secondary | ICD-10-CM

## 2014-12-01 NOTE — Progress Notes (Signed)
Subjective:     Patient ID: [name], female   DOB: [birth year], 69 y.o.   MRN: [id number]  HPI patient presents with nail disease 1-5 both feet that are painful and she cannot cut   Review of Systems     Objective:   Physical Exam Neurovascular status intact with thick yellow brittle nailbeds 1-5 both feet    Assessment:     Mycotic nail infection with pain 1-5 both feet    Plan:     Debridement painful nailbeds 1-5 both feet with no iatrogenic bleeding noted

## 2014-12-09 ENCOUNTER — Ambulatory Visit
Admission: RE | Admit: 2014-12-09 | Discharge: 2014-12-09 | Disposition: A | Discharge status: Home / Self Care | Payer: Medicare Other | Source: Ambulatory Visit

## 2014-12-09 DIAGNOSIS — Z1231 Encounter for screening mammogram for malignant neoplasm of breast: Secondary | ICD-10-CM

## 2014-12-10 ENCOUNTER — Encounter: Payer: Self-pay | Admitting: Physician Assistant

## 2014-12-10 ENCOUNTER — Ambulatory Visit (INDEPENDENT_AMBULATORY_CARE_PROVIDER_SITE_OTHER): Payer: Medicare Other | Admitting: Physician Assistant

## 2014-12-10 VITALS — BP 128/64 | HR 94 | Temp 98.2°F | Resp 18 | Ht 65.0 in | Wt 190.0 lb

## 2014-12-10 DIAGNOSIS — N189 Chronic kidney disease, unspecified: Secondary | ICD-10-CM

## 2014-12-10 DIAGNOSIS — F419 Anxiety disorder, unspecified: Secondary | ICD-10-CM

## 2014-12-10 DIAGNOSIS — E1042 Type 1 diabetes mellitus with diabetic polyneuropathy: Secondary | ICD-10-CM

## 2014-12-10 DIAGNOSIS — E1022 Type 1 diabetes mellitus with diabetic chronic kidney disease: Secondary | ICD-10-CM

## 2014-12-10 DIAGNOSIS — L989 Disorder of the skin and subcutaneous tissue, unspecified: Secondary | ICD-10-CM

## 2014-12-10 DIAGNOSIS — Z79899 Other long term (current) drug therapy: Secondary | ICD-10-CM | POA: Insufficient documentation

## 2014-12-10 DIAGNOSIS — I10 Essential (primary) hypertension: Secondary | ICD-10-CM

## 2014-12-10 DIAGNOSIS — E039 Hypothyroidism, unspecified: Secondary | ICD-10-CM | POA: Insufficient documentation

## 2014-12-10 DIAGNOSIS — E559 Vitamin D deficiency, unspecified: Secondary | ICD-10-CM

## 2014-12-10 DIAGNOSIS — E785 Hyperlipidemia, unspecified: Secondary | ICD-10-CM

## 2014-12-10 DIAGNOSIS — G4700 Insomnia, unspecified: Secondary | ICD-10-CM

## 2014-12-10 DIAGNOSIS — M109 Gout, unspecified: Secondary | ICD-10-CM | POA: Insufficient documentation

## 2014-12-10 DIAGNOSIS — F325 Major depressive disorder, single episode, in full remission: Secondary | ICD-10-CM

## 2014-12-10 LAB — TSH: TSH: 2.211 u[IU]/mL (ref 0.350–4.500)

## 2014-12-10 MED ORDER — ALPRAZOLAM 0.5 MG PO TABS: 0.5000 mg | ORAL_TABLET | Freq: Every evening | ORAL | Status: AC | PRN

## 2014-12-10 MED ORDER — PREGABALIN 50 MG PO CAPS: 50.0000 mg | ORAL_CAPSULE | Freq: Every evening | ORAL | Status: AC | PRN

## 2014-12-10 MED ORDER — DOXYCYCLINE HYCLATE 100 MG PO TABS: 100.0000 mg | ORAL_TABLET | Freq: Two times a day (BID) | ORAL | Status: DC

## 2014-12-10 NOTE — Patient Instructions (Addendum)
-Please take Xanax at prescribed for sleep at bedtime -Please take Lyrica for leg pain and nerve pain. -Take doxycycline with food for infection. -Please continue medications as prescribed.    Please call podiatrist and discuss cracked heels and open fissure on left foot.  Please follow up in 3 months for physical with Dr. Melford Aase.  Diabetes and Foot Care Diabetes may cause you to have problems because of poor blood supply (circulation) to your feet and legs. This may cause the skin on your feet to become thinner, break easier, and heal more slowly. Your skin may become dry, and the skin may peel and crack. You may also have nerve damage in your legs and feet causing decreased feeling in them. You may not notice minor injuries to your feet that could lead to infections or more serious problems. Taking care of your feet is one of the most important things you can do for yourself.  HOME CARE INSTRUCTIONS  Wear shoes at all times, even in the house. Do not go barefoot. Bare feet are easily injured.  Check your feet daily for blisters, cuts, and redness. If you cannot see the bottom of your feet, use a mirror or ask someone for help.  Wash your feet with warm water (do not use hot water) and mild soap. Then pat your feet and the areas between your toes until they are completely dry. Do not soak your feet as this can dry your skin.  Apply a moisturizing lotion or petroleum jelly (that does not contain alcohol and is unscented) to the skin on your feet and to dry, brittle toenails. Do not apply lotion between your toes.  Trim your toenails straight across. Do not dig under them or around the cuticle. File the edges of your nails with an emery board or nail file.  Do not cut corns or calluses or try to remove them with medicine.  Wear clean socks or stockings every day. Make sure they are not too tight. Do not wear knee-high stockings since they may decrease blood flow to your legs.  Wear shoes  that fit properly and have enough cushioning. To break in new shoes, wear them for just a few hours a day. This prevents you from injuring your feet. Always look in your shoes before you put them on to be sure there are no objects inside.  Do not cross your legs. This may decrease the blood flow to your feet.  If you find a minor scrape, cut, or break in the skin on your feet, keep it and the skin around it clean and dry. These areas may be cleansed with mild soap and water. Do not cleanse the area with peroxide, alcohol, or iodine.  When you remove an adhesive bandage, be sure not to damage the skin around it.  If you have a wound, look at it several times a day to make sure it is healing.  Do not use heating pads or hot water bottles. They may burn your skin. If you have lost feeling in your feet or legs, you may not know it is happening until it is too late.  Make sure your health care provider performs a complete foot exam at least annually or more often if you have foot problems. Report any cuts, sores, or bruises to your health care provider immediately. SEEK MEDICAL CARE IF:   You have an injury that is not healing.  You have cuts or breaks in the skin.  You have  an ingrown nail.  You notice redness on your legs or feet.  You feel burning or tingling in your legs or feet.  You have pain or cramps in your legs and feet.  Your legs or feet are numb.  Your feet always feel cold. SEEK IMMEDIATE MEDICAL CARE IF:   There is increasing redness, swelling, or pain in or around a wound.  There is a red line that goes up your leg.  Pus is coming from a wound.  You develop a fever or as directed by your health care provider.  You notice a bad smell coming from an ulcer or wound. Document Released: 11/04/2000 Document Revised: 07/10/2013 Document Reviewed: 04/16/2013 [facility] Patient Information 2015 [place], [place]. This information is not intended to replace advice given to you  by your health care provider. Make sure you discuss any questions you have with your health care provider.

## 2014-12-10 NOTE — Progress Notes (Addendum)
HPI An African American 69 y.o. 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  Last visit was 02/06/14.  Patient no showed to appt on 08/13/14.  Patient is non-compliant.  Patient states she has gone to endocrinologist before, and is not interested in seeing one.  Explained to the patient that it was highly recommended as they can help manage her diabetes better and help with nutritionist and diabetic education.  Patient states she will think about it.  Patient is concerned about itchiness on left posterior ankle that has been going on for months.  She has tried Hydrocortisone cream, benadryl, antibiotic ointment OTC with no relief.  Patient is here for diabetic shoes.  She also states the Ativan is not helping with her sleep and would like to try another medication.     Her blood pressure has been controlled at home, today their BP is BP: 128/64 mmHg.  She is currently taking Clonidine 0.3mg , Lasix 80mg  and Metolazone 5mg .  She does not workout. She denies chest pain, shortness of breath, dizziness.   She is on cholesterol medication (Lipitor) and denies myalgias. Her cholesterol is not at goal. The cholesterol last visit was:   Lab Results  Component Value Date   CHOL 176 02/06/2014   HDL 35* 02/06/2014   LDLCALC 111* 02/06/2014   TRIG 152* 02/06/2014   CHOLHDL 5.0 02/06/2014   She has been working on diet and not exercise for Type I Diabetes, and denies increased appetite, polydipsia and polyuria. She has had diabetes (type I) since 1984.  Chronic kidney disease Stage 5 being followed by Dr. Justin Mend.  Last A1C in the office was:  Lab Results  Component Value Date   HGBA1C 8.3* 02/06/2014  Currently manages diabetes with?  Lantus 60 units at bedtime and Humalog 42 units Does the patient rotate injection sites? yes   Bruises?no  Any issues at injection sites?  no Glucometer/glucose log sheets provided? NO- patient checks 3 times a week.  Varies- 62 and 235 Low BS (under 70)?  Yes- has the shakes.  Attributed to? Not wanting to eat and then taking insulin even though she did not eat enough.   Require outside assistance? July 2015 and called ambulance Does pt have a medical bracelet or card in wallet?  Talked about card or bracelet, so that if found EMT would know she is a diabetic. Number of meals per day? 2  B- light breakfast- egg, toast, dry cereal, oatmeal  L- meat and vegetable  D- meat and vegetable and starch  Snacks? Cookies   Beverages? Ice tea (sweet and low or lemon juice) or sprite Podiatry Exam? Yes- last week was seen and did not mention foot problems to podiatrist- Dr. Ila Mcgill.  Patient is on Vitamin D supplement.- 4,000 IU daily. No results found for: VD25OH    Hypothyroidism- Levothyroxine 42mcg daily and takes by itself in morning.  ROS- Review of Systems  Constitutional: Negative.  Negative for fever, chills, malaise/fatigue and diaphoresis.  HENT: Negative.  Negative for congestion, ear discharge, ear pain and sore throat.   Eyes: Negative.   Respiratory: Negative.  Negative for cough, sputum production, shortness of breath and wheezing.   Cardiovascular: Negative.  Negative for chest pain and leg swelling.  Gastrointestinal: Negative.  Negative for nausea, vomiting, abdominal pain, diarrhea and constipation.  Genitourinary: Negative.  Negative for dysuria, urgency and frequency.  Musculoskeletal: Negative.   Skin: Negative.        Except itchy skin on left posterior ankle.  Patient has tried antibiotic cream, lotion and hydrocortisone.  Patient also has dried cracked heels. Patient is also having gout flare-ups and is currently on allopurinol and colchicine.    Neurological: Negative.  Negative for dizziness and headaches.  Psychiatric/Behavioral: Positive for depression. The patient is nervous/anxious and has  insomnia.        Patient states her depression and anxiety is controlled by Celexa.   Medical History:  Past Medical History  Diagnosis Date  . Type II or unspecified type diabetes mellitus without mention of complication, not stated as uncontrolled   . Esophageal reflux   . Depressive disorder, not elsewhere classified   . Chronic cough   . Shortness of breath   . Hyperparathyroidism   . Colon polyposis   . HLD (hyperlipidemia)   . Renal insufficiency     function is stable at present-Dr. Justin Mend follows  . Unspecified essential hypertension   . HTN (hypertension)   . Anemia   . Anxiety   . DJD (degenerative joint disease)   . CHF (congestive heart failure)     with well preserved EF   . COPD (chronic obstructive pulmonary disease)     mild-" oxygen use 2 l/m nightly"    Current Medications:  Current Outpatient Prescriptions on File Prior to Visit  Medication Sig Dispense Refill  . albuterol (PROVENTIL HFA;VENTOLIN HFA) 108 (90 BASE) MCG/ACT inhaler Inhale 1-2 puffs into the lungs every 6 (six) hours as needed for wheezing or shortness of breath.    . allopurinol (ZYLOPRIM) 300 MG tablet take 1 tablet by mouth once daily 90 tablet 4  . aspirin 81 MG tablet Take 1 tablet (81 mg total) by mouth daily. 30 tablet   . atorvastatin (LIPITOR) 80 MG tablet take 1 tablet by mouth once daily 30 tablet 3  . B-D ULTRAFINE III SHORT PEN 31G X 8 MM MISC use three times a day 100 each PRN  . calcitRIOL (ROCALTROL) 0.25 MCG capsule Take 0.25 mcg by mouth See admin instructions. M,W,F    . calcium acetate (PHOSLO) 667 MG capsule Take 1,334 mg by mouth 3 (three) times daily with meals.     . cetirizine (ZYRTEC) 10 MG tablet Take 10 mg by mouth daily.    . Cholecalciferol 1000 UNITS tablet Take 4,000 Units by mouth daily.    . cinacalcet (SENSIPAR) 30 MG tablet Take 30 mg by mouth daily.    . citalopram (CELEXA) 40 MG tablet take 1 tablet by mouth once daily 90 tablet 1  . cloNIDine (CATAPRES) 0.3  MG tablet Take 0.3 mg by mouth 2 (two) times daily.    . colchicine 0.6 MG tablet Take 1 tablet (0.6 mg total) by mouth daily. 30 tablet 11  . fluticasone (FLONASE) 50 MCG/ACT nasal spray Place 1 spray into both nostrils 2 (two) times daily.    . furosemide (LASIX) 80 MG tablet Take 160 mg by mouth 2 (two) times daily.     Marland Kitchen HYDROcodone-homatropine (HYCODAN) 5-1.5 MG/5ML syrup Take 5 mLs by mouth every 6 (six) hours as needed for cough. 240 mL 0  . HYDROcodone-homatropine (HYCODAN) 5-1.5 MG/5ML syrup Take 5 mLs by mouth every 6 (six) hours as needed for cough. 473 mL 0  . Insulin Glargine (LANTUS) 100 UNIT/ML Solostar Pen Inject 62 units in the am and 42 units in the pm.    .  insulin lispro (HUMALOG) 100 UNIT/ML injection Inject 20 Units into the skin 2 (two) times daily.    Marland Kitchen levothyroxine (SYNTHROID, LEVOTHROID) 50 MCG tablet Take 50 mcg by mouth daily before breakfast.     . LORazepam (ATIVAN) 2 MG tablet take 1/2 to 1 tablet by mouth at bedtime if needed for sleep 30 tablet 1  . metolazone (ZAROXOLYN) 5 MG tablet Take 5 mg by mouth every Monday, Wednesday, and Friday.     . potassium chloride SA (K-DUR,KLOR-CON) 20 MEQ tablet Take 40 mEq by mouth 3 (three) times daily.     . pregabalin (LYRICA) 50 MG capsule Take 50 mg by mouth at bedtime as needed (nerve pain).     . ranitidine (ZANTAC) 300 MG tablet One at bedtime 30 tablet 11  . traMADol (ULTRAM) 50 MG tablet Take 50 mg by mouth every 6 (six) hours as needed.     No current facility-administered medications on file prior to visit.   Allergies:  Allergies  Allergen Reactions  . Codeine Hives and Itching    Family history- Review and unchanged Social history- Review and unchanged  Physical Exam: BP 128/64 mmHg  Pulse 94  Temp(Src) 98.2 F (36.8 C) (Temporal)  Resp 18  Ht 5\' 5"  (1.651 m)  Wt 190 lb (86.183 kg)  BMI 31.62 kg/m2  SpO2 90% Wt Readings from Last 3 Encounters:  12/10/14 190 lb (86.183 kg)  10/09/14 186 lb (84.369  kg)  09/03/14 191 lb 6.4 oz (86.818 kg)  Vitals Reviewed. General Appearance: Well nourished, in no apparent distress. Obese. Eyes: PERRLA, EOMIs, conjunctiva no swelling or erythema.  No scleral icterus. Sinuses: No Frontal/maxillary tenderness ENT/Mouth: External auditory canals clear, TMs without erythema, edema or bulging. No erythema, edema, or exudate on posterior pharynx.  Tonsils not swollen or erythematous. Hearing normal.  Neck: Supple, thyroid normal.  Respiratory: Respiratory effort normal, CTAB.  No w/r/r or stridor.  Cardio: RRR.  m/r/g. S1S2nl. Decreased peripheral pulses without edema.  Abdomen: Soft, + normal BS.  Non tender, no guarding, rebound, hernias, masses. Lymphatics: Non tender without lymphadenopathy.  Musculoskeletal: Full ROM, 5/5 strength, normal gait.  Skin: Warm, dry without rashes and ecchymosis.  Patient has several open cuts on her left posterior ankle that are erythematous, non-edematous, pruritic and no discharge.  Patient also has dry cracked heels with open painful cracks. Neuro: Cranial nerves intact. No cerebellar symptoms. Sensation decreased from toes to proximal tibia bilaterally. Psych: Awake and oriented X 3, normal affect, Insight and Judgment appropriate.   Foot Exam: DP reduced bilateral, PT reduced bilateral, reduced sensation from toes to proximal tibia bilaterally (patient unable to feel monofilament), trophic changes with skin breakdown and cracks in heels and distal tibia (especially on left leg has lesions/cuts), dry cracking heels, bunions and onychomycosis  Assessment and Plan:  OVER 40 minutes of exam, counseling, chart review. 1. Essential hypertension Continue medications as prescribed.  Monitor blood pressure at home.  Reminder to go to the ER if any CP, SOB, nausea, dizziness, severe HA, changes vision/speech, left arm numbness and tingling and jaw pain. - CBC with Differential - BASIC METABOLIC PANEL WITH GFR - Hepatic function  panel  2. Dyslipidemia Continue Lipitor as prescribed.  Please follow recommended diet and exercise.  Check cholesterol. - Lipid panel  3. Type 1 diabetes mellitus with diabetic chronic kidney disease Continue Lantus and Humalog as prescribed. Please write down blood sugar readings with date and time or bring in glucose meter.  Please follow  recommended diet and exercise.  Check A1C level. Recommend that patient see endocrinologist due to poor compliance. - Hemoglobin A1c  4. Depression, major, in remission/Anxiety/Insomnia Continue Celexa and start Xanax as prescribed. Stop Ativan. - ALPRAZolam (XANAX) 0.5 MG tablet; Take 1 tablet (0.5 mg total) by mouth at bedtime as needed for anxiety or sleep.  Dispense: 30 tablet; Refill: 0  5. Vitamin D deficiency Continue vitamin D as prescribed.  Will check level at next appt due to insurance not covering test.  6. Encounter for long-term (current) use of medications Will monitor kidney and liver function.  Kidney function monitored by Dr. Justin Mend. - CBC with Differential - BASIC METABOLIC PANEL WITH GFR - Hepatic function panel  7. Gout of right foot, unspecified cause, unspecified chronicity Continue allopurinol and colchicine as prescribed.  Check uric acid level. - Uric acid  8. Diabetic polyneuropathy associated with type 1 diabetes mellitus Continue Lyrica as prescribed- pregabalin (LYRICA) 50 MG capsule; Take 1 capsule (50 mg total) by mouth at bedtime as needed (nerve pain).  Dispense: 30 capsule; Refill: 2  9. Leg sore- left leg - doxycycline (VIBRA-TABS) 100 MG tablet; Take 1 tablet (100 mg total) by mouth 2 (two) times daily. 7 days  Dispense: 14 tablet; Refill: 0 Please let me know if not feeling better in 10-14 days.  If not better, then will send to wound clinic.  10. Hypothyroidism Check TSH level, continue medications the same, reminded to take on an empty stomach 30-24mins before food.   Continue diet and meds as  discussed. Further disposition pending results of labs.  Discussed medication effects and SE's.  Pt agreed to treatment plan. Please keep your physical appt on 03/16/15.  Addendum: Due to patient's non-compliance and chronic diabetic issues (CKD and neuropathy), I am referring patient to Dr. Renato Shin (endocrinologist).  This was approved by Dr. Melford Aase due to her non-compliance we will no longer see her for diabetes.   Nitzia Perren, Stephani Police, PA-C 9:45 AM [facility]

## 2014-12-11 NOTE — Addendum Note (Signed)
Addended by: Charolette Forward on: 12/11/2014 05:11 PM   Modules accepted: Orders

## 2014-12-12 LAB — BASIC METABOLIC PANEL WITH GFR
BUN: 84 mg/dL — ABNORMAL HIGH (ref 6–23)
CO2: 31 mEq/L (ref 19–32)
CREATININE: 5.15 mg/dL — AB (ref 0.50–1.10)
Calcium: 9.4 mg/dL (ref 8.4–10.5)
Chloride: 101 mEq/L (ref 96–112)
GFR, EST AFRICAN AMERICAN: 9 mL/min — AB
GFR, EST NON AFRICAN AMERICAN: 8 mL/min — AB
Glucose, Bld: 148 mg/dL — ABNORMAL HIGH (ref 70–99)
Potassium: 3.9 mEq/L (ref 3.5–5.3)
Sodium: 141 mEq/L (ref 135–145)

## 2014-12-12 LAB — HEMOGLOBIN A1C
Hgb A1c MFr Bld: 7.4 % — ABNORMAL HIGH (ref <5.7)
Mean Plasma Glucose: 166 mg/dL — ABNORMAL HIGH (ref <117)

## 2014-12-12 LAB — HEPATIC FUNCTION PANEL
ALT: 34 U/L (ref 0–35)
AST: 35 U/L (ref 0–37)
Albumin: 3.4 g/dL — ABNORMAL LOW (ref 3.5–5.2)
Alkaline Phosphatase: 68 U/L (ref 39–117)
BILIRUBIN TOTAL: 0.6 mg/dL (ref 0.2–1.2)
Bilirubin, Direct: 0.2 mg/dL (ref 0.0–0.3)
Indirect Bilirubin: 0.4 mg/dL (ref 0.2–1.2)
TOTAL PROTEIN: 5.8 g/dL — AB (ref 6.0–8.3)

## 2014-12-12 LAB — LIPID PANEL
CHOL/HDL RATIO: 5.5 ratio
CHOLESTEROL: 149 mg/dL (ref 0–200)
HDL: 27 mg/dL — ABNORMAL LOW (ref >39)
LDL Cholesterol: 98 mg/dL (ref 0–99)
Triglycerides: 120 mg/dL (ref <150)
VLDL: 24 mg/dL (ref 0–40)

## 2014-12-12 LAB — URIC ACID: Uric Acid, Serum: 3.7 mg/dL (ref 2.4–7.0)

## 2014-12-15 LAB — CBC WITH DIFFERENTIAL/PLATELET
BASOS ABS: 0 10*3/uL (ref 0.0–0.1)
BASOS PCT: 0 % (ref 0–1)
Eosinophils Absolute: 0 10*3/uL (ref 0.0–0.7)
Eosinophils Relative: 0 % (ref 0–5)
HEMATOCRIT: 38.9 % (ref 36.0–46.0)
HEMOGLOBIN: 11.9 g/dL — AB (ref 12.0–15.0)
LYMPHS ABS: 1.2 10*3/uL (ref 0.7–4.0)
Lymphocytes Relative: 19 % (ref 12–46)
MCH: 29.1 pg (ref 26.0–34.0)
MCHC: 30.6 g/dL (ref 30.0–36.0)
MCV: 95.1 fL (ref 78.0–100.0)
MPV: 11.7 fL (ref 8.6–12.4)
Monocytes Absolute: 0.5 10*3/uL (ref 0.1–1.0)
Monocytes Relative: 7 % (ref 3–12)
NEUTROS ABS: 4.8 10*3/uL (ref 1.7–7.7)
NEUTROS PCT: 74 % (ref 43–77)
PLATELETS: 221 10*3/uL (ref 150–400)
RBC: 4.09 MIL/uL (ref 3.87–5.11)
RDW: 17.7 % — ABNORMAL HIGH (ref 11.5–15.5)
WBC: 6.5 10*3/uL (ref 4.0–10.5)

## 2014-12-15 NOTE — Addendum Note (Signed)
Addended by: Charolette Forward on: 12/15/2014 06:39 PM   Modules accepted: Orders

## 2014-12-15 NOTE — Addendum Note (Signed)
Addended by: Charolette Forward on: 12/15/2014 05:26 PM   Modules accepted: Orders

## 2014-12-23 ENCOUNTER — Ambulatory Visit: Payer: Medicare Other | Admitting: Endocrinology

## 2014-12-25 ENCOUNTER — Encounter (HOSPITAL_COMMUNITY): Payer: Self-pay | Admitting: *Deleted

## 2014-12-25 ENCOUNTER — Ambulatory Visit: Payer: Medicare Other | Admitting: Endocrinology

## 2014-12-25 ENCOUNTER — Emergency Department (HOSPITAL_COMMUNITY): Payer: Medicare Other

## 2014-12-25 ENCOUNTER — Other Ambulatory Visit: Payer: Self-pay

## 2014-12-25 ENCOUNTER — Inpatient Hospital Stay (HOSPITAL_COMMUNITY)
Admission: EM | Admit: 2014-12-25 | Discharge: 2014-12-31 | DRG: 640 | Disposition: A | Discharge status: Home Health | Payer: Medicare Other | Attending: Internal Medicine | Admitting: Internal Medicine

## 2014-12-25 ENCOUNTER — Telehealth: Payer: Self-pay | Admitting: Endocrinology

## 2014-12-25 DIAGNOSIS — E11649 Type 2 diabetes mellitus with hypoglycemia without coma: Secondary | ICD-10-CM | POA: Diagnosis present

## 2014-12-25 DIAGNOSIS — F17210 Nicotine dependence, cigarettes, uncomplicated: Secondary | ICD-10-CM | POA: Diagnosis present

## 2014-12-25 DIAGNOSIS — Z794 Long term (current) use of insulin: Secondary | ICD-10-CM | POA: Diagnosis not present

## 2014-12-25 DIAGNOSIS — M109 Gout, unspecified: Secondary | ICD-10-CM | POA: Diagnosis present

## 2014-12-25 DIAGNOSIS — I120 Hypertensive chronic kidney disease with stage 5 chronic kidney disease or end stage renal disease: Secondary | ICD-10-CM | POA: Diagnosis present

## 2014-12-25 DIAGNOSIS — J440 Chronic obstructive pulmonary disease with acute lower respiratory infection: Secondary | ICD-10-CM | POA: Diagnosis present

## 2014-12-25 DIAGNOSIS — E039 Hypothyroidism, unspecified: Secondary | ICD-10-CM | POA: Diagnosis present

## 2014-12-25 DIAGNOSIS — F329 Major depressive disorder, single episode, unspecified: Secondary | ICD-10-CM | POA: Diagnosis present

## 2014-12-25 DIAGNOSIS — E162 Hypoglycemia, unspecified: Secondary | ICD-10-CM

## 2014-12-25 DIAGNOSIS — N2581 Secondary hyperparathyroidism of renal origin: Secondary | ICD-10-CM | POA: Diagnosis present

## 2014-12-25 DIAGNOSIS — R509 Fever, unspecified: Secondary | ICD-10-CM

## 2014-12-25 DIAGNOSIS — R0602 Shortness of breath: Secondary | ICD-10-CM

## 2014-12-25 DIAGNOSIS — Z7982 Long term (current) use of aspirin: Secondary | ICD-10-CM

## 2014-12-25 DIAGNOSIS — B961 Klebsiella pneumoniae [K. pneumoniae] as the cause of diseases classified elsewhere: Secondary | ICD-10-CM | POA: Diagnosis present

## 2014-12-25 DIAGNOSIS — I10 Essential (primary) hypertension: Secondary | ICD-10-CM

## 2014-12-25 DIAGNOSIS — D649 Anemia, unspecified: Secondary | ICD-10-CM | POA: Diagnosis present

## 2014-12-25 DIAGNOSIS — Z79899 Other long term (current) drug therapy: Secondary | ICD-10-CM | POA: Diagnosis not present

## 2014-12-25 DIAGNOSIS — N390 Urinary tract infection, site not specified: Secondary | ICD-10-CM | POA: Diagnosis present

## 2014-12-25 DIAGNOSIS — N186 End stage renal disease: Secondary | ICD-10-CM | POA: Diagnosis present

## 2014-12-25 DIAGNOSIS — E213 Hyperparathyroidism, unspecified: Secondary | ICD-10-CM | POA: Diagnosis present

## 2014-12-25 DIAGNOSIS — E1121 Type 2 diabetes mellitus with diabetic nephropathy: Secondary | ICD-10-CM | POA: Diagnosis present

## 2014-12-25 DIAGNOSIS — J209 Acute bronchitis, unspecified: Secondary | ICD-10-CM | POA: Diagnosis present

## 2014-12-25 DIAGNOSIS — R0902 Hypoxemia: Secondary | ICD-10-CM | POA: Diagnosis not present

## 2014-12-25 DIAGNOSIS — E875 Hyperkalemia: Principal | ICD-10-CM

## 2014-12-25 DIAGNOSIS — E785 Hyperlipidemia, unspecified: Secondary | ICD-10-CM | POA: Diagnosis present

## 2014-12-25 DIAGNOSIS — J218 Acute bronchiolitis due to other specified organisms: Secondary | ICD-10-CM

## 2014-12-25 DIAGNOSIS — I5032 Chronic diastolic (congestive) heart failure: Secondary | ICD-10-CM | POA: Diagnosis present

## 2014-12-25 DIAGNOSIS — K219 Gastro-esophageal reflux disease without esophagitis: Secondary | ICD-10-CM | POA: Diagnosis present

## 2014-12-25 DIAGNOSIS — Z885 Allergy status to narcotic agent status: Secondary | ICD-10-CM | POA: Diagnosis not present

## 2014-12-25 DIAGNOSIS — J449 Chronic obstructive pulmonary disease, unspecified: Secondary | ICD-10-CM

## 2014-12-25 DIAGNOSIS — D696 Thrombocytopenia, unspecified: Secondary | ICD-10-CM | POA: Diagnosis not present

## 2014-12-25 DIAGNOSIS — R0600 Dyspnea, unspecified: Secondary | ICD-10-CM

## 2014-12-25 DIAGNOSIS — N19 Unspecified kidney failure: Secondary | ICD-10-CM

## 2014-12-25 LAB — URINE MICROSCOPIC-ADD ON

## 2014-12-25 LAB — COMPREHENSIVE METABOLIC PANEL
ALT: 94 U/L — ABNORMAL HIGH (ref 0–35)
AST: 122 U/L — ABNORMAL HIGH (ref 0–37)
Albumin: 2.7 g/dL — ABNORMAL LOW (ref 3.5–5.2)
Alkaline Phosphatase: 109 U/L (ref 39–117)
Anion gap: 10 (ref 5–15)
BILIRUBIN TOTAL: 0.5 mg/dL (ref 0.3–1.2)
BUN: 106 mg/dL — ABNORMAL HIGH (ref 6–23)
CO2: 25 mmol/L (ref 19–32)
CREATININE: 5.64 mg/dL — AB (ref 0.50–1.10)
Calcium: 9.1 mg/dL (ref 8.4–10.5)
Chloride: 106 mmol/L (ref 96–112)
GFR calc Af Amer: 8 mL/min — ABNORMAL LOW (ref >90)
GFR, EST NON AFRICAN AMERICAN: 7 mL/min — AB (ref >90)
GLUCOSE: 101 mg/dL — AB (ref 70–99)
POTASSIUM: 6.5 mmol/L — AB (ref 3.5–5.1)
SODIUM: 141 mmol/L (ref 135–145)
Total Protein: 5.1 g/dL — ABNORMAL LOW (ref 6.0–8.3)

## 2014-12-25 LAB — URINALYSIS, ROUTINE W REFLEX MICROSCOPIC
Bilirubin Urine: NEGATIVE
GLUCOSE, UA: NEGATIVE mg/dL
KETONES UR: NEGATIVE mg/dL
NITRITE: NEGATIVE
PROTEIN: 100 mg/dL — AB
Specific Gravity, Urine: 1.012 (ref 1.005–1.030)
Urobilinogen, UA: 0.2 mg/dL (ref 0.0–1.0)
pH: 5 (ref 5.0–8.0)

## 2014-12-25 LAB — I-STAT CG4 LACTIC ACID, ED: LACTIC ACID, VENOUS: 1.3 mmol/L (ref 0.5–2.0)

## 2014-12-25 LAB — I-STAT CHEM 8, ED
BUN: 106 mg/dL — ABNORMAL HIGH (ref 6–23)
Calcium, Ion: 1.17 mmol/L (ref 1.13–1.30)
Chloride: 107 mmol/L (ref 96–112)
Creatinine, Ser: 5.1 mg/dL — ABNORMAL HIGH (ref 0.50–1.10)
HCT: 42 % (ref 36.0–46.0)
HEMOGLOBIN: 14.3 g/dL (ref 12.0–15.0)
Potassium: 5.7 mmol/L — ABNORMAL HIGH (ref 3.5–5.1)
Sodium: 141 mmol/L (ref 135–145)
TCO2: 24 mmol/L (ref 0–100)

## 2014-12-25 LAB — CBG MONITORING, ED
GLUCOSE-CAPILLARY: 110 mg/dL — AB (ref 70–99)
Glucose-Capillary: 82 mg/dL (ref 70–99)
Glucose-Capillary: 84 mg/dL (ref 70–99)

## 2014-12-25 LAB — CBC
HCT: 39.1 % (ref 36.0–46.0)
HEMOGLOBIN: 12.1 g/dL (ref 12.0–15.0)
MCH: 28.9 pg (ref 26.0–34.0)
MCHC: 30.9 g/dL (ref 30.0–36.0)
MCV: 93.5 fL (ref 78.0–100.0)
Platelets: 181 10*3/uL (ref 150–400)
RBC: 4.18 MIL/uL (ref 3.87–5.11)
RDW: 18.1 % — ABNORMAL HIGH (ref 11.5–15.5)
WBC: 7 10*3/uL (ref 4.0–10.5)

## 2014-12-25 LAB — MRSA PCR SCREENING: MRSA BY PCR: NEGATIVE

## 2014-12-25 LAB — GLUCOSE, CAPILLARY: Glucose-Capillary: 101 mg/dL — ABNORMAL HIGH (ref 70–99)

## 2014-12-25 LAB — BRAIN NATRIURETIC PEPTIDE: B Natriuretic Peptide: 1166.9 pg/mL — ABNORMAL HIGH (ref 0.0–100.0)

## 2014-12-25 LAB — I-STAT TROPONIN, ED: TROPONIN I, POC: 0.31 ng/mL — AB (ref 0.00–0.08)

## 2014-12-25 MED ORDER — ALTEPLASE 2 MG IJ SOLR
2.0000 mg | Freq: Once | INTRAMUSCULAR | Status: DC | PRN
  Filled 2014-12-25: qty 2

## 2014-12-25 MED ORDER — SODIUM CHLORIDE 0.9 % IV SOLN: 100.0000 mL | INTRAVENOUS | Status: DC | PRN

## 2014-12-25 MED ORDER — LEVOTHYROXINE SODIUM 50 MCG PO TABS
50.0000 ug | ORAL_TABLET | Freq: Every day | ORAL | Status: DC
  Administered 2014-12-26 – 2014-12-31 (×6): 50 ug via ORAL
  Filled 2014-12-25 (×7): qty 1

## 2014-12-25 MED ORDER — DEXTROSE 50 % IV SOLN
1.0000 | Freq: Once | INTRAVENOUS | Status: AC
  Administered 2014-12-25: 50 mL via INTRAVENOUS

## 2014-12-25 MED ORDER — ONDANSETRON HCL 4 MG/2ML IJ SOLN: 4.0000 mg | Freq: Four times a day (QID) | INTRAMUSCULAR | Status: DC | PRN

## 2014-12-25 MED ORDER — ASPIRIN EC 81 MG PO TBEC
81.0000 mg | DELAYED_RELEASE_TABLET | Freq: Every day | ORAL | Status: DC
  Administered 2014-12-26 – 2014-12-31 (×6): 81 mg via ORAL
  Filled 2014-12-25 (×6): qty 1

## 2014-12-25 MED ORDER — ONDANSETRON HCL 4 MG PO TABS: 4.0000 mg | ORAL_TABLET | Freq: Four times a day (QID) | ORAL | Status: DC | PRN

## 2014-12-25 MED ORDER — PENTAFLUOROPROP-TETRAFLUOROETH EX AERO: 1.0000 "application " | INHALATION_SPRAY | CUTANEOUS | Status: DC | PRN

## 2014-12-25 MED ORDER — ATORVASTATIN CALCIUM 80 MG PO TABS
80.0000 mg | ORAL_TABLET | Freq: Every day | ORAL | Status: DC
  Administered 2014-12-26 – 2014-12-30 (×5): 80 mg via ORAL
  Filled 2014-12-25 (×6): qty 1

## 2014-12-25 MED ORDER — FAMOTIDINE 20 MG PO TABS
20.0000 mg | ORAL_TABLET | Freq: Every day | ORAL | Status: DC
  Administered 2014-12-25 – 2014-12-31 (×7): 20 mg via ORAL
  Filled 2014-12-25 (×7): qty 1

## 2014-12-25 MED ORDER — METOLAZONE 5 MG PO TABS
5.0000 mg | ORAL_TABLET | ORAL | Status: DC
  Administered 2014-12-26: 5 mg via ORAL
  Filled 2014-12-25: qty 1

## 2014-12-25 MED ORDER — ASPIRIN 81 MG PO TABS: 81.0000 mg | ORAL_TABLET | Freq: Every day | ORAL | Status: DC

## 2014-12-25 MED ORDER — HYDROCODONE-ACETAMINOPHEN 5-325 MG PO TABS: 1.0000 | ORAL_TABLET | ORAL | Status: DC | PRN

## 2014-12-25 MED ORDER — ALUM & MAG HYDROXIDE-SIMETH 200-200-20 MG/5ML PO SUSP
30.0000 mL | Freq: Four times a day (QID) | ORAL | Status: DC | PRN
  Administered 2014-12-28: 30 mL via ORAL
  Filled 2014-12-25: qty 30

## 2014-12-25 MED ORDER — CETYLPYRIDINIUM CHLORIDE 0.05 % MT LIQD
7.0000 mL | Freq: Two times a day (BID) | OROMUCOSAL | Status: DC
  Administered 2014-12-25 – 2014-12-31 (×9): 7 mL via OROMUCOSAL

## 2014-12-25 MED ORDER — DEXTROSE 50 % IV SOLN
1.0000 | Freq: Once | INTRAVENOUS | Status: AC
  Administered 2014-12-25: 50 mL via INTRAVENOUS
  Filled 2014-12-25: qty 50

## 2014-12-25 MED ORDER — HEPARIN SODIUM (PORCINE) 5000 UNIT/ML IJ SOLN
5000.0000 [IU] | Freq: Three times a day (TID) | INTRAMUSCULAR | Status: DC
  Administered 2014-12-25 – 2014-12-29 (×12): 5000 [IU] via SUBCUTANEOUS
  Filled 2014-12-25 (×15): qty 1

## 2014-12-25 MED ORDER — FAMOTIDINE 40 MG PO TABS: 40.0000 mg | ORAL_TABLET | Freq: Every day | ORAL | Status: DC

## 2014-12-25 MED ORDER — FLUTICASONE PROPIONATE 50 MCG/ACT NA SUSP
1.0000 | Freq: Two times a day (BID) | NASAL | Status: DC
  Administered 2014-12-25 – 2014-12-31 (×12): 1 via NASAL
  Filled 2014-12-25: qty 16

## 2014-12-25 MED ORDER — HEPARIN SODIUM (PORCINE) 1000 UNIT/ML DIALYSIS
20.0000 [IU]/kg | INTRAMUSCULAR | Status: DC | PRN
  Filled 2014-12-25: qty 2

## 2014-12-25 MED ORDER — DEXTROSE 50 % IV SOLN
INTRAVENOUS | Status: AC
  Filled 2014-12-25: qty 50

## 2014-12-25 MED ORDER — CALCITRIOL 0.25 MCG PO CAPS: 0.2500 ug | ORAL_CAPSULE | ORAL | Status: DC

## 2014-12-25 MED ORDER — ALLOPURINOL 300 MG PO TABS
300.0000 mg | ORAL_TABLET | Freq: Every day | ORAL | Status: DC
  Administered 2014-12-26 – 2014-12-31 (×6): 300 mg via ORAL
  Filled 2014-12-25 (×6): qty 1

## 2014-12-25 MED ORDER — HEPARIN SODIUM (PORCINE) 1000 UNIT/ML DIALYSIS
1000.0000 [IU] | INTRAMUSCULAR | Status: DC | PRN
  Filled 2014-12-25: qty 1

## 2014-12-25 MED ORDER — CLONIDINE HCL 0.3 MG PO TABS
0.3000 mg | ORAL_TABLET | Freq: Two times a day (BID) | ORAL | Status: DC
  Administered 2014-12-25 – 2014-12-31 (×11): 0.3 mg via ORAL
  Filled 2014-12-25 (×5): qty 1
  Filled 2014-12-25: qty 3
  Filled 2014-12-25: qty 1
  Filled 2014-12-25: qty 3
  Filled 2014-12-25 (×7): qty 1

## 2014-12-25 MED ORDER — TRAMADOL HCL 50 MG PO TABS
50.0000 mg | ORAL_TABLET | Freq: Two times a day (BID) | ORAL | Status: DC | PRN
  Administered 2014-12-27 – 2014-12-30 (×2): 50 mg via ORAL
  Filled 2014-12-25 (×2): qty 1

## 2014-12-25 MED ORDER — ALBUTEROL SULFATE HFA 108 (90 BASE) MCG/ACT IN AERS: 1.0000 | INHALATION_SPRAY | Freq: Four times a day (QID) | RESPIRATORY_TRACT | Status: DC | PRN

## 2014-12-25 MED ORDER — COLCHICINE 0.6 MG PO TABS
0.6000 mg | ORAL_TABLET | Freq: Every day | ORAL | Status: DC
  Administered 2014-12-26 – 2014-12-31 (×6): 0.6 mg via ORAL
  Filled 2014-12-25 (×6): qty 1

## 2014-12-25 MED ORDER — SODIUM POLYSTYRENE SULFONATE 15 GM/60ML PO SUSP
45.0000 g | Freq: Once | ORAL | Status: AC
  Administered 2014-12-25: 45 g via ORAL
  Filled 2014-12-25: qty 180

## 2014-12-25 MED ORDER — GUAIFENESIN-DM 100-10 MG/5ML PO SYRP
5.0000 mL | ORAL_SOLUTION | ORAL | Status: DC | PRN
  Filled 2014-12-25: qty 5

## 2014-12-25 MED ORDER — INSULIN ASPART 100 UNIT/ML ~~LOC~~ SOLN
10.0000 [IU] | Freq: Once | SUBCUTANEOUS | Status: AC
  Administered 2014-12-25: 10 [IU] via INTRAVENOUS
  Filled 2014-12-25: qty 1

## 2014-12-25 MED ORDER — POLYETHYLENE GLYCOL 3350 17 G PO PACK
17.0000 g | PACK | Freq: Every day | ORAL | Status: DC | PRN
Start: 2014-12-25 — End: 2014-12-31
  Filled 2014-12-25: qty 1

## 2014-12-25 MED ORDER — LORATADINE 10 MG PO TABS
10.0000 mg | ORAL_TABLET | Freq: Every day | ORAL | Status: DC
  Administered 2014-12-26 – 2014-12-31 (×6): 10 mg via ORAL
  Filled 2014-12-25 (×6): qty 1

## 2014-12-25 MED ORDER — LIDOCAINE-PRILOCAINE 2.5-2.5 % EX CREA: 1.0000 "application " | TOPICAL_CREAM | CUTANEOUS | Status: DC | PRN

## 2014-12-25 MED ORDER — ALBUTEROL SULFATE (2.5 MG/3ML) 0.083% IN NEBU
5.0000 mg | INHALATION_SOLUTION | Freq: Once | RESPIRATORY_TRACT | Status: AC
  Administered 2014-12-25: 5 mg via RESPIRATORY_TRACT
  Filled 2014-12-25: qty 6

## 2014-12-25 MED ORDER — INSULIN ASPART 100 UNIT/ML ~~LOC~~ SOLN
0.0000 [IU] | Freq: Three times a day (TID) | SUBCUTANEOUS | Status: DC
  Administered 2014-12-26 – 2014-12-27 (×2): 2 [IU] via SUBCUTANEOUS
  Administered 2014-12-28: 1 [IU] via SUBCUTANEOUS
  Administered 2014-12-28 (×2): 3 [IU] via SUBCUTANEOUS
  Administered 2014-12-29: 5 [IU] via SUBCUTANEOUS
  Administered 2014-12-29: 1 [IU] via SUBCUTANEOUS
  Administered 2014-12-29: 3 [IU] via SUBCUTANEOUS
  Administered 2014-12-30: 1 [IU] via SUBCUTANEOUS
  Administered 2014-12-31 (×2): 2 [IU] via SUBCUTANEOUS

## 2014-12-25 MED ORDER — INSULIN GLARGINE 100 UNIT/ML ~~LOC~~ SOLN
30.0000 [IU] | Freq: Every day | SUBCUTANEOUS | Status: DC
  Filled 2014-12-25: qty 0.3

## 2014-12-25 MED ORDER — ALBUTEROL SULFATE (2.5 MG/3ML) 0.083% IN NEBU: 2.5000 mg | INHALATION_SOLUTION | Freq: Four times a day (QID) | RESPIRATORY_TRACT | Status: DC | PRN

## 2014-12-25 MED ORDER — CALCITRIOL 0.25 MCG PO CAPS
0.2500 ug | ORAL_CAPSULE | ORAL | Status: DC
  Administered 2014-12-26 – 2014-12-31 (×3): 0.25 ug via ORAL
  Filled 2014-12-25 (×3): qty 1

## 2014-12-25 MED ORDER — CALCIUM ACETATE 667 MG PO CAPS
1334.0000 mg | ORAL_CAPSULE | Freq: Three times a day (TID) | ORAL | Status: DC
  Administered 2014-12-26 – 2014-12-31 (×15): 1334 mg via ORAL
  Filled 2014-12-25 (×18): qty 2

## 2014-12-25 MED ORDER — INSULIN GLARGINE 100 UNIT/ML SOLOSTAR PEN: 30.0000 [IU] | PEN_INJECTOR | Freq: Every day | SUBCUTANEOUS | Status: DC

## 2014-12-25 MED ORDER — DEXTROSE 5 % IV SOLN
Freq: Once | INTRAVENOUS | Status: AC
  Administered 2014-12-25: 100 mL via INTRAVENOUS

## 2014-12-25 MED ORDER — PREGABALIN 50 MG PO CAPS: 50.0000 mg | ORAL_CAPSULE | Freq: Every evening | ORAL | Status: DC | PRN

## 2014-12-25 MED ORDER — SODIUM CHLORIDE 0.9 % IJ SOLN
3.0000 mL | Freq: Two times a day (BID) | INTRAMUSCULAR | Status: DC
  Administered 2014-12-25 – 2014-12-31 (×9): 3 mL via INTRAVENOUS

## 2014-12-25 MED ORDER — CITALOPRAM HYDROBROMIDE 40 MG PO TABS
40.0000 mg | ORAL_TABLET | Freq: Every day | ORAL | Status: DC
  Administered 2014-12-26 – 2014-12-31 (×6): 40 mg via ORAL
  Filled 2014-12-25 (×6): qty 1

## 2014-12-25 MED ORDER — LIDOCAINE HCL (PF) 1 % IJ SOLN: 5.0000 mL | INTRAMUSCULAR | Status: DC | PRN

## 2014-12-25 MED ORDER — FUROSEMIDE 80 MG PO TABS
160.0000 mg | ORAL_TABLET | Freq: Two times a day (BID) | ORAL | Status: DC
  Administered 2014-12-25 – 2014-12-31 (×11): 160 mg via ORAL
  Filled 2014-12-25 (×14): qty 2

## 2014-12-25 MED ORDER — TRAMADOL HCL 50 MG PO TABS: 50.0000 mg | ORAL_TABLET | ORAL | Status: DC | PRN

## 2014-12-25 MED ORDER — NEPRO/CARBSTEADY PO LIQD: 237.0000 mL | ORAL | Status: DC | PRN

## 2014-12-25 MED ORDER — ALPRAZOLAM 0.5 MG PO TABS
0.5000 mg | ORAL_TABLET | Freq: Every evening | ORAL | Status: DC | PRN
  Administered 2014-12-26 – 2014-12-30 (×4): 0.5 mg via ORAL
  Filled 2014-12-25 (×4): qty 1

## 2014-12-25 MED ORDER — ACETAMINOPHEN 500 MG PO TABS
1000.0000 mg | ORAL_TABLET | Freq: Every day | ORAL | Status: DC | PRN
  Administered 2014-12-28: 1000 mg via ORAL
  Filled 2014-12-25: qty 2

## 2014-12-25 NOTE — ED Notes (Signed)
Patient reports she is having chest pain.  ekg completed at this time.  Patient noted to have twitching all over.  She has save arm on the left for future dialysis.  She is not currently a diaylsys patient .  Patient is seen by dr Justin Mend

## 2014-12-25 NOTE — ED Notes (Signed)
[id number]. Apple juice and pb crackers given to pt

## 2014-12-25 NOTE — ED Notes (Signed)
This RN spoke with lab and they are going to run the CMP at this time.

## 2014-12-25 NOTE — H&P (Signed)
Patient Demographics  [name], is a 69 y.o. female  MRN: [id number]   DOB - [birth year]  Admit Date - 12/25/2014  Outpatient Primary MD for the patient is Alesia Richards, MD   With History of -  Past Medical History  Diagnosis Date  . Type II or unspecified type diabetes mellitus without mention of complication, not stated as uncontrolled   . Esophageal reflux   . Depressive disorder, not elsewhere classified   . Chronic cough   . Shortness of breath   . Hyperparathyroidism   . Colon polyposis   . HLD (hyperlipidemia)   . Renal insufficiency     function is stable at present-Dr. Justin Mend follows  . Unspecified essential hypertension   . HTN (hypertension)   . Anemia   . Anxiety   . DJD (degenerative joint disease)   . CHF (congestive heart failure)     with well preserved EF   . COPD (chronic obstructive pulmonary disease)     mild-" oxygen use 2 l/m nightly"      Past Surgical History  Procedure Laterality Date  . Insertion of dialysis catheter Left 10-03-13    3 yrs ago -Left upper arm AVGG  . Vascular surgery Left 2011    arm  . Colonoscopy w/ polypectomy  10-02-13    hx. past 6 months -2 removed  . Colonoscopy N/A 10/28/2013    Procedure: COLONOSCOPY;  Surgeon: Inda Castle, MD;  Location: [facility];  Service: Endoscopy;  Laterality: N/A;  . Hot hemostasis N/A 10/28/2013    Procedure: HOT HEMOSTASIS (ARGON PLASMA COAGULATION/BICAP);  Surgeon: Inda Castle, MD;  Location: [facility];  Service: Endoscopy;  Laterality: N/A;    in for   Chief Complaint  Patient presents with  . Leg Swelling  . Shortness of Breath  . Chest Pain  . Weakness     HPI  [name]  is a 69 y.o. female, with history of chronic kidney disease stage V now close to ESRD, dyslipidemia,  hypertension, hyper parathyroid is him, diabetes but is type 2 insulin-dependent, essential hypertension, brought into the hospital with gradually progressive leg swelling, shortness of breath, in the ER she was found to be confused and altered, her CBG was 20, apparently patient was not feeling well and taking care of her husband and forgot to eat breakfast and lunch despite taking her Lantus last night.  In the ER workup suggested of hypoglycemia, altered mental status which reversed after she received D50, CT head nonacute, she was seen by nephrology who decided that based on her potassium, elevated BUN and close to ESRD status they will start dialysis tonight. I was called to admit the patient. Patient is now close to her baseline. Currently no subjective complaints except some chronic cramps and twitching in both arms.    Review of Systems  currently negative.  In addition to the HPI above,  No Fever-chills, No  Headache, No changes with Vision or hearing, No problems swallowing food or Liquids, No Chest pain, Cough or Shortness of Breath, No Abdominal pain, No Nausea or Vommitting, Bowel movements are regular, No Blood in stool or Urine, No dysuria, No new skin rashes or bruises, No new joints pains-aches,  No new weakness, tingling, numbness in any extremity, No recent weight gain or loss, No polyuria, polydypsia or polyphagia, No significant Mental Stressors.  A full 10 point Review of Systems was done, except as stated above, all other Review of Systems were negative.   Social History History  Substance Use Topics  . Smoking status: Current Every Day Smoker -- 1.00 packs/day for 30 years    Types: Cigarettes  . Smokeless tobacco: Never Used     Comment: 1ppd x 30 years.  restarted and smoke 3 cigarettes daily  . Alcohol Use: No      Family History Family History  Problem Relation Age of Onset  . Heart disease Father   . Cancer Sister   . Hypertension    . Diabetes     . Colon cancer Neg Hx       Prior to Admission medications   Medication Sig Start Date End Date Taking? Authorizing Provider  acetaminophen (TYLENOL) 500 MG tablet Take 1,000 mg by mouth daily as needed (back pain).   Yes Historical Provider, MD  albuterol (PROVENTIL HFA;VENTOLIN HFA) 108 (90 BASE) MCG/ACT inhaler Inhale 1-2 puffs into the lungs every 6 (six) hours as needed for wheezing or shortness of breath.   Yes Historical Provider, MD  allopurinol (ZYLOPRIM) 300 MG tablet take 1 tablet by mouth once daily 04/17/14  Yes Unk Pinto, MD  ALPRAZolam Duanne Moron) 0.5 MG tablet Take 1 tablet (0.5 mg total) by mouth at bedtime as needed for anxiety or sleep. 12/10/14 12/10/15 Yes Jennifer L Couillard, PA-C  aspirin 81 MG tablet Take 1 tablet (81 mg total) by mouth daily. 11/07/13  Yes Adeline Saralyn Pilar, MD  atorvastatin (LIPITOR) 40 MG tablet Take 40 mg by mouth daily.   Yes Historical Provider, MD  B-D ULTRAFINE III SHORT PEN 31G X 8 MM MISC use three times a day 09/08/14  Yes Unk Pinto, MD  calcitRIOL (ROCALTROL) 0.25 MCG capsule Take 0.25 mcg by mouth See admin instructions. M,W,F   Yes Historical Provider, MD  calcium acetate (PHOSLO) 667 MG capsule Take 1,334 mg by mouth 3 (three) times daily with meals.  02/09/11  Yes Historical Provider, MD  cetirizine (ZYRTEC) 10 MG tablet Take 10 mg by mouth daily.   Yes Historical Provider, MD  Cholecalciferol 1000 UNITS tablet Take 4,000 Units by mouth daily.   Yes Historical Provider, MD  citalopram (CELEXA) 40 MG tablet take 1 tablet by mouth once daily 07/17/14  Yes Unk Pinto, MD  cloNIDine (CATAPRES) 0.3 MG tablet Take 0.3 mg by mouth 2 (two) times daily. 12/15/11  Yes Marijean Heath, NP  colchicine 0.6 MG tablet Take 1 tablet (0.6 mg total) by mouth daily. 03/17/14  Yes Unk Pinto, MD  fluticasone (FLONASE) 50 MCG/ACT nasal spray Place 1 spray into both nostrils 2 (two) times daily.   Yes Historical Provider, MD  furosemide  (LASIX) 80 MG tablet Take 160 mg by mouth 2 (two) times daily.    Yes Historical Provider, MD  HYDROcodone-homatropine (HYCODAN) 5-1.5 MG/5ML syrup Take 5 mLs by mouth every 6 (six) hours as needed for cough. 10/09/14  Yes Collene Gobble, MD  Insulin Glargine (LANTUS) 100 UNIT/ML Solostar  Pen Inject 62 units in the am and 42 units in the pm. 08/11/14  Yes Unk Pinto, MD  insulin lispro (HUMALOG) 100 UNIT/ML injection Inject 40 Units into the skin 2 (two) times daily.    Yes Historical Provider, MD  levothyroxine (SYNTHROID, LEVOTHROID) 50 MCG tablet Take 50 mcg by mouth daily before breakfast.    Yes Historical Provider, MD  metolazone (ZAROXOLYN) 5 MG tablet Take 5 mg by mouth every Monday, Wednesday, and Friday.    Yes Historical Provider, MD  potassium chloride SA (K-DUR,KLOR-CON) 20 MEQ tablet Take 40 mEq by mouth 3 (three) times daily.  12/15/11  Yes Marijean Heath, NP  pregabalin (LYRICA) 50 MG capsule Take 1 capsule (50 mg total) by mouth at bedtime as needed (nerve pain). 12/10/14  Yes Jennifer L Couillard, PA-C  ranitidine (ZANTAC) 300 MG tablet One at bedtime Patient taking differently: Take 300 mg by mouth at bedtime. One at bedtime 02/08/13  Yes Tanda Rockers, MD  traMADol (ULTRAM) 50 MG tablet Take 50 mg by mouth every 4 (four) hours as needed for moderate pain or severe pain.    Yes Historical Provider, MD  atorvastatin (LIPITOR) 80 MG tablet take 1 tablet by mouth once daily 11/07/14   Unk Pinto, MD  doxycycline (VIBRA-TABS) 100 MG tablet Take 1 tablet (100 mg total) by mouth 2 (two) times daily. 7 days Patient not taking: Reported on 12/25/2014 12/10/14   Anderson Malta L Couillard, PA-C  HYDROcodone-homatropine (HYCODAN) 5-1.5 MG/5ML syrup Take 5 mLs by mouth every 6 (six) hours as needed for cough. Patient not taking: Reported on 12/25/2014 09/03/14   Collene Gobble, MD    Allergies  Allergen Reactions  . Codeine Hives and Itching    Physical Exam  Vitals  Blood  pressure 139/65, pulse 82, temperature 98.2 F (36.8 C), temperature source Rectal, resp. rate 16, height 5' 5.5" (1.664 m), weight 94.887 kg (209 lb 3 oz), SpO2 97 %.   1. General middle-aged obese African-American female lying in bed in NAD,    2. Normal affect and insight, Not Suicidal or Homicidal, Awake Alert, Oriented X 3.  3. No F.N deficits, ALL C.Nerves Intact, Strength 5/5 all 4 extremities, Sensation intact all 4 extremities, Plantars down going. Does have twitching in both arms left more than right says that's normal and intermittent for her on a chronic basis.  4. Ears and Eyes appear Normal, Conjunctivae clear, PERRLA. Moist Oral Mucosa.  5. Supple Neck, No JVD, No cervical lymphadenopathy appriciated, No Carotid Bruits.  6. Symmetrical Chest wall movement, Good air movement bilaterally, CTAB.  7. RRR, No Gallops, Rubs or Murmurs, No Parasternal Heave.  8. Positive Bowel Sounds, Abdomen Soft, No tenderness, No organomegaly appriciated,No rebound -guarding or rigidity.  9.  No Cyanosis, Normal Skin Turgor, No Skin Rash or Bruise.  10. Good muscle tone,  joints appear normal , no effusions, Normal ROM.  11. No Palpable Lymph Nodes in Neck or Axillae     Data Review  CBC  Recent Labs Lab 12/25/14 1048 12/25/14 1108  WBC 7.0  --   HGB 12.1 14.3  HCT 39.1 42.0  PLT 181  --   MCV 93.5  --   MCH 28.9  --   MCHC 30.9  --   RDW 18.1*  --    ------------------------------------------------------------------------------------------------------------------  Chemistries   Recent Labs Lab 12/25/14 1108 12/25/14 1322  NA 141 141  K 5.7* 6.5*  CL 107 106  CO2  --  25  GLUCOSE <20* 101*  BUN 106* 106*  CREATININE 5.10* 5.64*  CALCIUM  --  9.1  AST  --  122*  ALT  --  94*  ALKPHOS  --  109  BILITOT  --  0.5   ------------------------------------------------------------------------------------------------------------------ estimated creatinine clearance  is 11 mL/min (by C-G formula based on Cr of 5.64). ------------------------------------------------------------------------------------------------------------------ No results for input(s): TSH, T4TOTAL, T3FREE, THYROIDAB in the last 72 hours.  Invalid input(s): FREET3   Coagulation profile No results for input(s): INR, PROTIME in the last 168 hours. ------------------------------------------------------------------------------------------------------------------- No results for input(s): DDIMER in the last 72 hours. -------------------------------------------------------------------------------------------------------------------  Cardiac Enzymes No results for input(s): CKMB, TROPONINI, MYOGLOBIN in the last 168 hours.  Invalid input(s): CK ------------------------------------------------------------------------------------------------------------------ Invalid input(s): POCBNP   ---------------------------------------------------------------------------------------------------------------  Urinalysis    Component Value Date/Time   COLORURINE YELLOW 12/25/2014 1343   APPEARANCEUR HAZY* 12/25/2014 1343   LABSPEC 1.012 12/25/2014 1343   PHURINE 5.0 12/25/2014 1343   GLUCOSEU NEGATIVE 12/25/2014 1343   HGBUR MODERATE* 12/25/2014 1343   BILIRUBINUR NEGATIVE 12/25/2014 1343   KETONESUR NEGATIVE 12/25/2014 1343   PROTEINUR 100* 12/25/2014 1343   UROBILINOGEN 0.2 12/25/2014 1343   NITRITE NEGATIVE 12/25/2014 1343   LEUKOCYTESUR SMALL* 12/25/2014 1343    ----------------------------------------------------------------------------------------------------------------  Imaging results:   Ct Head Wo Contrast  12/25/2014   CLINICAL DATA:  Altered mental status and questionable seizure  EXAM: CT HEAD WITHOUT CONTRAST  TECHNIQUE: Contiguous axial images were obtained from the base of the skull through the vertex without intravenous contrast.  COMPARISON:  None.  FINDINGS: There is age  related volume loss. There is no intracranial mass, hemorrhage, extra-axial fluid collection, or midline shift. Gray-white compartments appear normal. No acute infarct apparent. There is mild basal ganglia calcification bilaterally. Bony calvarium appears intact. 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  IMPRESSION: No intracranial mass, hemorrhage, or focal gray -white compartment lesions/acute appearing infarct. Mild basal ganglia calcification is felt to be physiologic in this age group. 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   Electronically Signed   By: Lowella Grip M.D.   On: 12/25/2014 14:42   Dg Chest Portable 1 View  12/25/2014   CLINICAL DATA:  Leg swelling and shortness of breath.  Chest pain  EXAM: PORTABLE CHEST - 1 VIEW  COMPARISON:  04/16/2014  FINDINGS: Chronic cardiomegaly, hilar enlargement, and aortic tortuosity. When accounting for technique, there is no edema, consolidation, effusion, or pneumothorax. No osseous findings to explain chest pain  IMPRESSION: Cardiomegaly without failure.   Electronically Signed   By: Jorje Guild M.D.   On: 12/25/2014 11:32    My personal review of EKG: Rhythm NSR, non specific ST changes    Assessment & Plan  Principal Problem:   Hypoglycemia Active Problems:   Essential hypertension   Chronic diastolic heart failure   COPD (chronic obstructive pulmonary disease)   Hyperparathyroidism   Hyperkalemia   ESRD (end stage renal disease)    1. Hypoglycemia causing altered mental status. Patient took Lantus last night and forgot to eat breakfast and lunch, hypoglycemia resolved after D50, hold Lantus tonight, every before meals at bedtime Accu-Cheks. Hypoglycemia protocol if needed. Head CT nonacute.  2. DM type II with hyperglycemia. Check A1c, hold Lantus tonight, every before meals at bedtime sliding scale for now.   3. CK D stage V now close to ESRD with borderline hyperkalemia. Renal consulted dialysis to start tonight.   4. Mild  swelling in the lower extremities with mild shortness of breath. Due to #3 above  start dialysis. Continue home dose diuretic and fluid restriction.   5. COPD. No acute issues. Supportive care with nebulizer treatments oxygen as needed.   6. Dyslipidemia. Continue home dose statin.   7. Essential hypertension. Continue home dose Catapres along with diuretics. She still making urine.   8. History of gout. Continue allopurinol.   9. Hypothyroidism. Continue home dose Synthroid.   10. GERD. Continue on H2 blocker.   DVT Prophylaxis Heparin    AM Labs Ordered, also please review Full Orders  Family Communication: Admission, patients condition and plan of care including tests being ordered have been discussed with the patient and sister who indicate understanding and agree with the plan and Code Status.  Code Status Full  Likely DC to  Home  Condition GUARDED     Time spent in minutes : 35    Dink Creps K M.D on 12/25/2014 at 3:41 PM  Between 7am to 7pm - Pager - [phone]  After 7pm go to [email or web address] - [facility] Office  [phone]

## 2014-12-25 NOTE — Progress Notes (Signed)
Patient arrived on unit from ED via stretcher, no family at bedside.  Telemetry placed per MD order.

## 2014-12-25 NOTE — ED Provider Notes (Signed)
CSN: [id number]     Arrival date & time 12/25/14  1005 History   First MD Initiated Contact with Patient 12/25/14 1047     Chief Complaint  Patient presents with  . Leg Swelling  . Shortness of Breath  . Chest Pain  . Weakness     (Consider location/radiation/quality/duration/timing/severity/associated sxs/prior Treatment) HPI [name] is a 69 y.o. female with hx of renal failure, pre dialysis, type II DM, GRD, HTN, anxiety, CHF, presents to ED with weakness, swelling, altered mental status. Pt states "I just dont feel good and everything hurts." Also complaining of shortness of breath. Pt states she has not been feeling well yesterday as well. Pt had fistula placed in left arm, but has not started dialysis yet, followed by Dr. [employer].   Past Medical History  Diagnosis Date  . Type II or unspecified type diabetes mellitus without mention of complication, not stated as uncontrolled   . Esophageal reflux   . Depressive disorder, not elsewhere classified   . Chronic cough   . Shortness of breath   . Hyperparathyroidism   . Colon polyposis   . HLD (hyperlipidemia)   . Renal insufficiency     function is stable at present-Dr. Justin Mend follows  . Unspecified essential hypertension   . HTN (hypertension)   . Anemia   . Anxiety   . DJD (degenerative joint disease)   . CHF (congestive heart failure)     with well preserved EF   . COPD (chronic obstructive pulmonary disease)     mild-" oxygen use 2 l/m nightly"   Past Surgical History  Procedure Laterality Date  . Insertion of dialysis catheter Left 10-03-13    3 yrs ago -Left upper arm AVGG  . Vascular surgery Left 2011    arm  . Colonoscopy w/ polypectomy  10-02-13    hx. past 6 months -2 removed  . Colonoscopy N/A 10/28/2013    Procedure: COLONOSCOPY;  Surgeon: Inda Castle, MD;  Location: [facility];  Service: Endoscopy;  Laterality: N/A;  . Hot hemostasis N/A 10/28/2013    Procedure: HOT HEMOSTASIS (ARGON PLASMA  COAGULATION/BICAP);  Surgeon: Inda Castle, MD;  Location: [facility];  Service: Endoscopy;  Laterality: N/A;   Family History  Problem Relation Age of Onset  . Heart disease Father   . Cancer Sister   . Hypertension    . Diabetes    . Colon cancer Neg Hx    History  Substance Use Topics  . Smoking status: Current Every Day Smoker -- 1.00 packs/day for 30 years    Types: Cigarettes  . Smokeless tobacco: Never Used     Comment: 1ppd x 30 years.  restarted and smoke 3 cigarettes daily  . Alcohol Use: No   OB History    No data available     Review of Systems  Constitutional: Negative for fever and chills.  Respiratory: Negative for cough, chest tightness and shortness of breath.   Cardiovascular: Negative for chest pain, palpitations and leg swelling.  Gastrointestinal: Negative for nausea, vomiting, abdominal pain and diarrhea.  Genitourinary: Negative for dysuria, flank pain and pelvic pain.  Musculoskeletal: Positive for myalgias and arthralgias. Negative for neck pain and neck stiffness.  Skin: Negative for rash.  Neurological: Positive for weakness. Negative for dizziness and headaches.  Psychiatric/Behavioral: Positive for confusion.  All other systems reviewed and are negative.     Allergies  Codeine  Home Medications   Prior to Admission medications   Medication  Sig Start Date End Date Taking? Authorizing Provider  albuterol (PROVENTIL HFA;VENTOLIN HFA) 108 (90 BASE) MCG/ACT inhaler Inhale 1-2 puffs into the lungs every 6 (six) hours as needed for wheezing or shortness of breath.    Historical Provider, MD  allopurinol (ZYLOPRIM) 300 MG tablet take 1 tablet by mouth once daily 04/17/14   Unk Pinto, MD  ALPRAZolam Duanne Moron) 0.5 MG tablet Take 1 tablet (0.5 mg total) by mouth at bedtime as needed for anxiety or sleep. 12/10/14 12/10/15  Jennifer L Couillard, PA-C  aspirin 81 MG tablet Take 1 tablet (81 mg total) by mouth daily. 11/07/13   Sheila Oats, MD   atorvastatin (LIPITOR) 80 MG tablet take 1 tablet by mouth once daily 11/07/14   Unk Pinto, MD  B-D ULTRAFINE III SHORT PEN 31G X 8 MM MISC use three times a day 09/08/14   Unk Pinto, MD  calcitRIOL (ROCALTROL) 0.25 MCG capsule Take 0.25 mcg by mouth See admin instructions. M,W,F    Historical Provider, MD  calcium acetate (PHOSLO) 667 MG capsule Take 1,334 mg by mouth 3 (three) times daily with meals.  02/09/11   Historical Provider, MD  cetirizine (ZYRTEC) 10 MG tablet Take 10 mg by mouth daily.    Historical Provider, MD  Cholecalciferol 1000 UNITS tablet Take 4,000 Units by mouth daily.    Historical Provider, MD  cinacalcet (SENSIPAR) 30 MG tablet Take 30 mg by mouth daily.    Historical Provider, MD  citalopram (CELEXA) 40 MG tablet take 1 tablet by mouth once daily 07/17/14   Unk Pinto, MD  cloNIDine (CATAPRES) 0.3 MG tablet Take 0.3 mg by mouth 2 (two) times daily. 12/15/11   Marijean Heath, NP  colchicine 0.6 MG tablet Take 1 tablet (0.6 mg total) by mouth daily. 03/17/14   Unk Pinto, MD  doxycycline (VIBRA-TABS) 100 MG tablet Take 1 tablet (100 mg total) by mouth 2 (two) times daily. 7 days 12/10/14   Anderson Malta L Couillard, PA-C  fluticasone (FLONASE) 50 MCG/ACT nasal spray Place 1 spray into both nostrils 2 (two) times daily.    Historical Provider, MD  furosemide (LASIX) 80 MG tablet Take 160 mg by mouth 2 (two) times daily.     Historical Provider, MD  HYDROcodone-homatropine (HYCODAN) 5-1.5 MG/5ML syrup Take 5 mLs by mouth every 6 (six) hours as needed for cough. 09/03/14   Collene Gobble, MD  HYDROcodone-homatropine [facility]) 5-1.5 MG/5ML syrup Take 5 mLs by mouth every 6 (six) hours as needed for cough. 10/09/14   Collene Gobble, MD  Insulin Glargine (LANTUS) 100 UNIT/ML Solostar Pen Inject 62 units in the am and 42 units in the pm. 08/11/14   Unk Pinto, MD  insulin lispro (HUMALOG) 100 UNIT/ML injection Inject 20 Units into the skin 2 (two) times daily.     Historical Provider, MD  levothyroxine (SYNTHROID, LEVOTHROID) 50 MCG tablet Take 50 mcg by mouth daily before breakfast.     Historical Provider, MD  metolazone (ZAROXOLYN) 5 MG tablet Take 5 mg by mouth every Monday, Wednesday, and Friday.     Historical Provider, MD  potassium chloride SA (K-DUR,KLOR-CON) 20 MEQ tablet Take 40 mEq by mouth 3 (three) times daily.  12/15/11   Marijean Heath, NP  pregabalin (LYRICA) 50 MG capsule Take 1 capsule (50 mg total) by mouth at bedtime as needed (nerve pain). 12/10/14   Jennifer L Couillard, PA-C  ranitidine (ZANTAC) 300 MG tablet One at bedtime 02/08/13   Tanda Rockers, MD  traMADol (ULTRAM) 50 MG tablet Take 50 mg by mouth every 6 (six) hours as needed.    Historical Provider, MD   BP 124/96 mmHg  Pulse 90  Temp(Src) 98.2 F (36.8 C) (Oral)  Resp 28  Ht 5' 5.5" (1.664 m)  Wt 209 lb 3 oz (94.887 kg)  BMI 34.27 kg/m2  SpO2 97% Physical Exam  Constitutional: She appears well-developed and well-nourished. No distress.  HENT:  Head: Normocephalic.  Eyes: Conjunctivae are normal.  Neck: Neck supple.  Cardiovascular: Normal rate, regular rhythm and normal heart sounds.   Pulmonary/Chest: Effort normal and breath sounds normal. No respiratory distress. She has no wheezes. She has no rales.  Abdominal: Soft. Bowel sounds are normal. She exhibits no distension. There is no tenderness. There is no rebound.  Musculoskeletal: She exhibits no edema.  Neurological: She is alert.  Oriented to self and place. Follows directions. Unable to do finger to nose or heel to shin. Strength 5/5 and equal bilaterally. Twitching movements in face, bilateral upper and lower extremities  Skin: Skin is warm and dry.  Psychiatric: She has a normal mood and affect. Her behavior is normal.  Nursing note and vitals reviewed.   ED Course  Procedures (including critical care time) Labs Review Labs Reviewed  CBC - Abnormal; Notable for the following:    RDW 18.1  (*)    All other components within normal limits  BRAIN NATRIURETIC PEPTIDE - Abnormal; Notable for the following:    B Natriuretic Peptide 1166.9 (*)    All other components within normal limits  URINALYSIS, ROUTINE W REFLEX MICROSCOPIC - Abnormal; Notable for the following:    APPearance HAZY (*)    Hgb urine dipstick MODERATE (*)    Protein, ur 100 (*)    Leukocytes, UA SMALL (*)    All other components within normal limits  COMPREHENSIVE METABOLIC PANEL - Abnormal; Notable for the following:    Potassium 6.5 (*)    Glucose, Bld 101 (*)    BUN 106 (*)    Creatinine, Ser 5.64 (*)    Total Protein 5.1 (*)    Albumin 2.7 (*)    AST 122 (*)    ALT 94 (*)    GFR calc non Af Amer 7 (*)    GFR calc Af Amer 8 (*)    All other components within normal limits  URINE MICROSCOPIC-ADD ON - Abnormal; Notable for the following:    Bacteria, UA MANY (*)    All other components within normal limits  I-STAT TROPOININ, ED - Abnormal; Notable for the following:    Troponin i, poc 0.31 (*)    All other components within normal limits  I-STAT CHEM 8, ED - Abnormal; Notable for the following:    Potassium 5.7 (*)    BUN 106 (*)    Creatinine, Ser 5.10 (*)    Glucose, Bld <20 (*)    All other components within normal limits  CBG MONITORING, ED - Abnormal; Notable for the following:    Glucose-Capillary 110 (*)    All other components within normal limits  URINE CULTURE  I-STAT CG4 LACTIC ACID, ED  CBG MONITORING, ED  CBG MONITORING, ED    Imaging Review Ct Head Wo Contrast  12/25/2014   CLINICAL DATA:  Altered mental status and questionable seizure  EXAM: CT HEAD WITHOUT CONTRAST  TECHNIQUE: Contiguous axial images were obtained from the base of the skull through the vertex without intravenous contrast.  COMPARISON:  None.  FINDINGS: There is age related volume loss. There is no intracranial mass, hemorrhage, extra-axial fluid collection, or midline shift. Gray-white compartments appear  normal. No acute infarct apparent. There is mild basal ganglia calcification bilaterally. Bony calvarium appears intact. 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  IMPRESSION: No intracranial mass, hemorrhage, or focal gray -white compartment lesions/acute appearing infarct. Mild basal ganglia calcification is felt to be physiologic in this age group. 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   Electronically Signed   By: Lowella Grip M.D.   On: 12/25/2014 14:42   Dg Chest Portable 1 View  12/25/2014   CLINICAL DATA:  Leg swelling and shortness of breath.  Chest pain  EXAM: PORTABLE CHEST - 1 VIEW  COMPARISON:  04/16/2014  FINDINGS: Chronic cardiomegaly, hilar enlargement, and aortic tortuosity. When accounting for technique, there is no edema, consolidation, effusion, or pneumothorax. No osseous findings to explain chest pain  IMPRESSION: Cardiomegaly without failure.   Electronically Signed   By: Jorje Guild M.D.   On: 12/25/2014 11:32     EKG Interpretation   Date/Time:  Thursday December 25 2014 10:43:30 EST Ventricular Rate:  91 PR Interval:  142 QRS Duration: 94 QT Interval:  342 QTC Calculation: 420 R Axis:   -5 Text Interpretation:  Normal sinus rhythm Inferior infarct , age  undetermined Anterior infarct , age undetermined Abnormal ECG Confirmed by  RAY MD, Andee Poles [phone]) on 12/25/2014 10:59:36 AM      MDM   Final diagnoses:  Hyperkalemia  Renal failure  Hypoglycemia     10:57 AM Patient seen and examined. Patient complaining of generalized pain, weakness. She was brought in by family who are not currently at the bedside. Patient has history of renal failure, awaiting dialysis, not started dialysis yet. She is complaining of generalized swelling. On exam patient has twitching movements in the face as well as bilateral upper lower extremities. She is altered, oriented to self and place only.  11:20 AM Patient CBGs less than 20. Given 1 amp of D50. Patient is now more awake,  alert, oriented. States she did take her insulin this morning but forgot to eat. We'll continue to monitor. Patient is asking for food, will give a [place] sandwitch  2:15 PM Potassium 6.5. No ECG changes. Delay in labs due to two hemolyzed samples. No hemolysis on this sample. Will give albuterol, insulin with D50, kayexelate. Will contact nephrology  3:06 PM Spoke with nephrology, Dr. Moshe Cipro, will consult.  3:41 PM Spoke with [facility]. Will admit.   Filed Vitals:   12/25/14 1315 12/25/14 1345 12/25/14 1415 12/25/14 1502  BP: 152/65 130/65 143/67 139/65  Pulse: 79 81 80 82  Temp:    98.2 F (36.8 C)  TempSrc:    Rectal  Resp:    16  Height:      Weight:      SpO2: 91% 97% 97% 97%     Renold Genta, PA-C 12/25/14 1541  Shaune Pollack, MD 12/26/14 1019

## 2014-12-25 NOTE — ED Notes (Signed)
Chem 8 results given to Abigail Butts, RN on [employer]

## 2014-12-25 NOTE — Telephone Encounter (Signed)
Follow up advised. Contact patient and schedule visit in 2 weeks. 

## 2014-12-25 NOTE — ED Notes (Signed)
Report attempted 

## 2014-12-25 NOTE — Consult Note (Signed)
[facility] Renal Consultation Note  Requesting MD:  Indication for Consultation: progressive renal insufficiency with uremia and hyperkalemia  HPI:  [name] is a 69 y.o. female with past medical history significant for diabetes mellitus with nephropathy, hypertension, hyperlipidemia, anxiety as well as COPD. She also has progressive renal insufficiency likely secondary to diabetes. She is followed by Dr. Edrick Oh at [facility]. She is status post AV fistula placement she tells me 4 years ago. Patient was seen by Dr. Justin Mend in December 2015 at which time creatinine was 5.17 and BUN was 72. She was not felt to be uremic at that time. Patient states that over the last several days she's noticed worsening lower extremity edema. She was going to the diabetic doctor this morning when she was noted to be very shaky and anxious so was diverted to the emergency department. In the emergency room she was found to be hypoglycemic. This was corrected. Then labs returned with a BUN of over 100 and a potassium level of 6.5. Creatinine is a little bit higher in the high fives. Patient states that she feels better now that her sugar is better. However she is still having the twitching. She denies significant appetite disturbance. She is having energy issues though due to dyspnea. I have seen her and talk to her as well as her friend. I believe the best course of action at this time is to just go ahead and initiate dialysis therapy and patient is agreeable. Apparently her daughter is dietitian at the kidney center in [place]. She is in route to the hospital.  CREAT  Date/Time Value Ref Range Status  12/10/2014 10:13 AM 5.15* 0.50 - 1.10 mg/dL Final   CREATININE, SER  Date/Time Value Ref Range Status  12/25/2014 01:22 PM 5.64* 0.50 - 1.10 mg/dL Final  12/25/2014 11:08 AM 5.10* 0.50 - 1.10 mg/dL Final  04/16/2014 07:16 PM 5.17* 0.50 - 1.10 mg/dL Final  11/29/2013 10:45 AM  4.15* 0.50 - 1.10 mg/dL Final  11/06/2013 06:30 AM 3.73* 0.50 - 1.10 mg/dL Final  11/05/2013 11:40 PM 3.85* 0.50 - 1.10 mg/dL Final  10/31/2013 09:47 AM 4.19* 0.50 - 1.10 mg/dL Final  10/14/2013 11:15 AM 4.06* 0.50 - 1.10 mg/dL Final  08/13/2013 09:37 AM 3.51* 0.50 - 1.10 mg/dL Final  07/16/2013 09:42 AM 4.74* 0.50 - 1.10 mg/dL Final  06/18/2013 09:44 AM 3.85* 0.50 - 1.10 mg/dL Final  06/04/2013 09:41 AM 4.56* 0.50 - 1.10 mg/dL Final  05/06/2013 09:44 AM 4.08* 0.50 - 1.10 mg/dL Final  04/08/2013 09:27 AM 4.34* 0.50 - 1.10 mg/dL Final  03/11/2013 10:02 AM 4.55* 0.50 - 1.10 mg/dL Final  02/11/2013 09:51 AM 4.60* 0.50 - 1.10 mg/dL Final  01/14/2013 10:04 AM 3.85* 0.50 - 1.10 mg/dL Final  11/30/2012 09:32 AM 3.95* 0.50 - 1.10 mg/dL Final  11/02/2012 09:00 AM 4.50* 0.50 - 1.10 mg/dL Final  10/05/2012 08:40 AM 4.53* 0.50 - 1.10 mg/dL Final  08/31/2012 08:03 AM 5.11* 0.50 - 1.10 mg/dL Final  08/03/2012 01:43 PM 4.14* 0.50 - 1.10 mg/dL Final  06/18/2012 08:16 AM 4.10* 0.50 - 1.10 mg/dL Final  05/11/2012 08:36 AM 3.57* 0.50 - 1.10 mg/dL Final  04/13/2012 08:28 AM 3.22* 0.50 - 1.10 mg/dL Final  03/16/2012 08:54 AM 3.84* 0.50 - 1.10 mg/dL Final  02/17/2012 08:56 AM 3.47* 0.50 - 1.10 mg/dL Final  01/20/2012 01:50 PM 4.61* 0.50 - 1.10 mg/dL Final  12/25/2011 09:05 AM 5.15* 0.50 - 1.10 mg/dL Final  12/24/2011 06:45 AM 5.77*  0.50 - 1.10 mg/dL Final  12/23/2011 11:53 PM 5.67* 0.50 - 1.10 mg/dL Final  12/15/2011 05:45 AM 3.80* 0.50 - 1.10 mg/dL Final  12/14/2011 06:25 AM 3.79* 0.50 - 1.10 mg/dL Final  12/13/2011 07:15 AM 3.92* 0.50 - 1.10 mg/dL Final  12/12/2011 06:45 AM 4.15* 0.50 - 1.10 mg/dL Final  12/12/2011 01:17 AM 5.20* 0.50 - 1.10 mg/dL Final  12/11/2011 08:23 PM 5.16* 0.50 - 1.10 mg/dL Final  12/11/2011 06:55 PM 5.20* 0.50 - 1.10 mg/dL Final  12/09/2011 11:17 PM 3.90* 0.50 - 1.10 mg/dL Final  11/25/2011 08:25 AM 3.96* 0.50 - 1.10 mg/dL Final  10/28/2011 09:00 AM 3.91* 0.50 - 1.10 mg/dL  Final  09/29/2011 08:53 AM 4.05* 0.50 - 1.10 mg/dL Final  09/01/2011 08:37 AM 5.50* 0.50 - 1.10 mg/dL Final  08/05/2011 08:45 AM 5.11* 0.50 - 1.10 mg/dL Final  07/07/2011 08:18 AM 5.59* 0.50 - 1.10 mg/dL Final  06/17/2011 05:00 AM 5.97* 0.50 - 1.10 mg/dL Final  06/16/2011 06:25 AM 6.02* 0.50 - 1.10 mg/dL Final  06/15/2011 07:51 PM 5.97* 0.50 - 1.10 mg/dL Final  06/03/2011 10:26 AM 5.50* 0.50 - 1.10 mg/dL Final    Comment:    **Please note change in reference range.**  05/06/2011 09:30 AM 5.38* 0.50 - 1.10 mg/dL Final    Comment:    **Please note change in reference range.**  04/01/2011 01:31 PM 4.35* 0.4 - 1.2 mg/dL Final  03/02/2011 10:36 AM 5.01* 0.4 - 1.2 mg/dL Final     PMHx:   Past Medical History  Diagnosis Date  . Type II or unspecified type diabetes mellitus without mention of complication, not stated as uncontrolled   . Esophageal reflux   . Depressive disorder, not elsewhere classified   . Chronic cough   . Shortness of breath   . Hyperparathyroidism   . Colon polyposis   . HLD (hyperlipidemia)   . Renal insufficiency     function is stable at present-Dr. Justin Mend follows  . Unspecified essential hypertension   . HTN (hypertension)   . Anemia   . Anxiety   . DJD (degenerative joint disease)   . CHF (congestive heart failure)     with well preserved EF   . COPD (chronic obstructive pulmonary disease)     mild-" oxygen use 2 l/m nightly"    Past Surgical History  Procedure Laterality Date  . Insertion of dialysis catheter Left 10-03-13    3 yrs ago -Left upper arm AVGG  . Vascular surgery Left 2011    arm  . Colonoscopy w/ polypectomy  10-02-13    hx. past 6 months -2 removed  . Colonoscopy N/A 10/28/2013    Procedure: COLONOSCOPY;  Surgeon: Inda Castle, MD;  Location: [facility];  Service: Endoscopy;  Laterality: N/A;  . Hot hemostasis N/A 10/28/2013    Procedure: HOT HEMOSTASIS (ARGON PLASMA COAGULATION/BICAP);  Surgeon: Inda Castle, MD;  Location:  [facility];  Service: Endoscopy;  Laterality: N/A;    Family Hx:  Family History  Problem Relation Age of Onset  . Heart disease Father   . Cancer Sister   . Hypertension    . Diabetes    . Colon cancer Neg Hx     Social History:  reports that she has been smoking Cigarettes.  She has a 30 pack-year smoking history. She has never used smokeless tobacco. She reports that she does not drink alcohol or use illicit drugs.  Allergies:  Allergies  Allergen Reactions  . Codeine Hives and Itching  Medications: Prior to Admission medications   Medication Sig Start Date End Date Taking? Authorizing Provider  acetaminophen (TYLENOL) 500 MG tablet Take 1,000 mg by mouth daily as needed (back pain).   Yes Historical Provider, MD  albuterol (PROVENTIL HFA;VENTOLIN HFA) 108 (90 BASE) MCG/ACT inhaler Inhale 1-2 puffs into the lungs every 6 (six) hours as needed for wheezing or shortness of breath.   Yes Historical Provider, MD  allopurinol (ZYLOPRIM) 300 MG tablet take 1 tablet by mouth once daily 04/17/14  Yes Unk Pinto, MD  ALPRAZolam Duanne Moron) 0.5 MG tablet Take 1 tablet (0.5 mg total) by mouth at bedtime as needed for anxiety or sleep. 12/10/14 12/10/15 Yes Jennifer L Couillard, PA-C  aspirin 81 MG tablet Take 1 tablet (81 mg total) by mouth daily. 11/07/13  Yes Adeline Saralyn Pilar, MD  atorvastatin (LIPITOR) 40 MG tablet Take 40 mg by mouth daily.   Yes Historical Provider, MD  B-D ULTRAFINE III SHORT PEN 31G X 8 MM MISC use three times a day 09/08/14  Yes Unk Pinto, MD  calcitRIOL (ROCALTROL) 0.25 MCG capsule Take 0.25 mcg by mouth See admin instructions. M,W,F   Yes Historical Provider, MD  calcium acetate (PHOSLO) 667 MG capsule Take 1,334 mg by mouth 3 (three) times daily with meals.  02/09/11  Yes Historical Provider, MD  cetirizine (ZYRTEC) 10 MG tablet Take 10 mg by mouth daily.   Yes Historical Provider, MD  Cholecalciferol 1000 UNITS tablet Take 4,000 Units by mouth daily.    Yes Historical Provider, MD  citalopram (CELEXA) 40 MG tablet take 1 tablet by mouth once daily 07/17/14  Yes Unk Pinto, MD  cloNIDine (CATAPRES) 0.3 MG tablet Take 0.3 mg by mouth 2 (two) times daily. 12/15/11  Yes Marijean Heath, NP  colchicine 0.6 MG tablet Take 1 tablet (0.6 mg total) by mouth daily. 03/17/14  Yes Unk Pinto, MD  fluticasone (FLONASE) 50 MCG/ACT nasal spray Place 1 spray into both nostrils 2 (two) times daily.   Yes Historical Provider, MD  furosemide (LASIX) 80 MG tablet Take 160 mg by mouth 2 (two) times daily.    Yes Historical Provider, MD  HYDROcodone-homatropine (HYCODAN) 5-1.5 MG/5ML syrup Take 5 mLs by mouth every 6 (six) hours as needed for cough. 10/09/14  Yes Collene Gobble, MD  Insulin Glargine (LANTUS) 100 UNIT/ML Solostar Pen Inject 62 units in the am and 42 units in the pm. 08/11/14  Yes Unk Pinto, MD  insulin lispro (HUMALOG) 100 UNIT/ML injection Inject 40 Units into the skin 2 (two) times daily.    Yes Historical Provider, MD  levothyroxine (SYNTHROID, LEVOTHROID) 50 MCG tablet Take 50 mcg by mouth daily before breakfast.    Yes Historical Provider, MD  metolazone (ZAROXOLYN) 5 MG tablet Take 5 mg by mouth every Monday, Wednesday, and Friday.    Yes Historical Provider, MD  potassium chloride SA (K-DUR,KLOR-CON) 20 MEQ tablet Take 40 mEq by mouth 3 (three) times daily.  12/15/11  Yes Marijean Heath, NP  pregabalin (LYRICA) 50 MG capsule Take 1 capsule (50 mg total) by mouth at bedtime as needed (nerve pain). 12/10/14  Yes Jennifer L Couillard, PA-C  ranitidine (ZANTAC) 300 MG tablet One at bedtime Patient taking differently: Take 300 mg by mouth at bedtime. One at bedtime 02/08/13  Yes Tanda Rockers, MD  traMADol (ULTRAM) 50 MG tablet Take 50 mg by mouth every 4 (four) hours as needed for moderate pain or severe pain.    Yes Historical Provider,  MD  atorvastatin (LIPITOR) 80 MG tablet take 1 tablet by mouth once daily 11/07/14   Unk Pinto, MD  doxycycline (VIBRA-TABS) 100 MG tablet Take 1 tablet (100 mg total) by mouth 2 (two) times daily. 7 days Patient not taking: Reported on 12/25/2014 12/10/14   Anderson Malta L Couillard, PA-C  HYDROcodone-homatropine (HYCODAN) 5-1.5 MG/5ML syrup Take 5 mLs by mouth every 6 (six) hours as needed for cough. Patient not taking: Reported on 12/25/2014 09/03/14   Collene Gobble, MD    I have reviewed the patient's current medications.  Labs:  Results for orders placed or performed during the hospital encounter of 12/25/14 (from the past 48 hour(s))  CBC     Status: Abnormal   Collection Time: 12/25/14 10:48 AM  Result Value Ref Range   WBC 7.0 4.0 - 10.5 K/uL   RBC 4.18 3.87 - 5.11 MIL/uL   Hemoglobin 12.1 12.0 - 15.0 g/dL   HCT 39.1 36.0 - 46.0 %   MCV 93.5 78.0 - 100.0 fL   MCH 28.9 26.0 - 34.0 pg   MCHC 30.9 30.0 - 36.0 g/dL   RDW 18.1 (H) 11.5 - 15.5 %   Platelets 181 150 - 400 K/uL  BNP (order ONLY if patient complains of dyspnea/SOB AND you have documented it for THIS visit)     Status: Abnormal   Collection Time: 12/25/14 10:48 AM  Result Value Ref Range   B Natriuretic Peptide 1166.9 (H) 0.0 - 100.0 pg/mL  I-Stat Chem 8, ED     Status: Abnormal   Collection Time: 12/25/14 11:08 AM  Result Value Ref Range   Sodium 141 135 - 145 mmol/L   Potassium 5.7 (H) 3.5 - 5.1 mmol/L   Chloride 107 96 - 112 mmol/L   BUN 106 (H) 6 - 23 mg/dL   Creatinine, Ser 5.10 (H) 0.50 - 1.10 mg/dL   Glucose, Bld <20 (LL) 70 - 99 mg/dL   Calcium, Ion 1.17 1.13 - 1.30 mmol/L   TCO2 24 0 - 100 mmol/L   Hemoglobin 14.3 12.0 - 15.0 g/dL   HCT 42.0 36.0 - 46.0 %   Comment NOTIFIED PHYSICIAN   I-Stat CG4 Lactic Acid, ED     Status: None   Collection Time: 12/25/14 11:09 AM  Result Value Ref Range   Lactic Acid, Venous 1.30 0.5 - 2.0 mmol/L  I-stat troponin, ED (not at [facility])     Status: Abnormal   Collection Time: 12/25/14 11:30 AM  Result Value Ref Range   Troponin i, poc 0.31 (HH) 0.00 - 0.08 ng/mL    Comment NOTIFIED PHYSICIAN    Comment 3            Comment: Due to the release kinetics of cTnI, a negative result within the first hours of the onset of symptoms does not rule out myocardial infarction with certainty. If myocardial infarction is still suspected, repeat the test at appropriate intervals.   POC CBG, ED     Status: None   Collection Time: 12/25/14 11:41 AM  Result Value Ref Range   Glucose-Capillary 82 70 - 99 mg/dL  POC CBG, ED     Status: None   Collection Time: 12/25/14 12:30 PM  Result Value Ref Range   Glucose-Capillary 84 70 - 99 mg/dL  CBG monitoring, ED     Status: Abnormal   Collection Time: 12/25/14  1:16 PM  Result Value Ref Range   Glucose-Capillary 110 (H) 70 - 99 mg/dL  Comprehensive metabolic panel  Status: Abnormal   Collection Time: 12/25/14  1:22 PM  Result Value Ref Range   Sodium 141 135 - 145 mmol/L   Potassium 6.5 (HH) 3.5 - 5.1 mmol/L    Comment: REPEATED TO VERIFY NO VISIBLE HEMOLYSIS CRITICAL RESULT CALLED TO, READ BACK BY AND VERIFIED WITH: J EASLEY,RN 1408 12/25/14 D BRADLEY    Chloride 106 96 - 112 mmol/L   CO2 25 19 - 32 mmol/L   Glucose, Bld 101 (H) 70 - 99 mg/dL   BUN 106 (H) 6 - 23 mg/dL   Creatinine, Ser 5.64 (H) 0.50 - 1.10 mg/dL   Calcium 9.1 8.4 - 10.5 mg/dL   Total Protein 5.1 (L) 6.0 - 8.3 g/dL   Albumin 2.7 (L) 3.5 - 5.2 g/dL   AST 122 (H) 0 - 37 U/L   ALT 94 (H) 0 - 35 U/L   Alkaline Phosphatase 109 39 - 117 U/L   Total Bilirubin 0.5 0.3 - 1.2 mg/dL   GFR calc non Af Amer 7 (L) >90 mL/min   GFR calc Af Amer 8 (L) >90 mL/min    Comment: (NOTE) The eGFR has been calculated using the CKD EPI equation. This calculation has not been validated in all clinical situations. eGFR's persistently <90 mL/min signify possible Chronic Kidney Disease.    Anion gap 10 5 - 15  Urinalysis, Routine w reflex microscopic     Status: Abnormal   Collection Time: 12/25/14  1:43 PM  Result Value Ref Range   Color, Urine YELLOW  YELLOW   APPearance HAZY (A) CLEAR   Specific Gravity, Urine 1.012 1.005 - 1.030   pH 5.0 5.0 - 8.0   Glucose, UA NEGATIVE NEGATIVE mg/dL   Hgb urine dipstick MODERATE (A) NEGATIVE   Bilirubin Urine NEGATIVE NEGATIVE   Ketones, ur NEGATIVE NEGATIVE mg/dL   Protein, ur 100 (A) NEGATIVE mg/dL   Urobilinogen, UA 0.2 0.0 - 1.0 mg/dL   Nitrite NEGATIVE NEGATIVE   Leukocytes, UA SMALL (A) NEGATIVE  Urine microscopic-add on     Status: Abnormal   Collection Time: 12/25/14  1:43 PM  Result Value Ref Range   Squamous Epithelial / LPF RARE RARE   WBC, UA 21-50 <3 WBC/hpf   RBC / HPF 0-2 <3 RBC/hpf   Bacteria, UA MANY (A) RARE     ROS:  A comprehensive review of systems was negative except for: Constitutional: positive for fatigue and malaise Respiratory: positive for dyspnea on exertion Cardiovascular: positive for exertional chest pressure/discomfort and lower extremity edema Neurological: positive for tremors  Physical Exam: Filed Vitals:   12/25/14 1502  BP: 139/65  Pulse: 82  Temp: 98.2 F (36.8 C)  Resp: 16     General:  A black female who appears to be her stated age. She is alert enough to understand what I am saying. Confusion was an issue earlier in the emergency department visit but did resolve when her sugar normalized. HEENT: Pupils are equal round reactive to light, extra ocular motions are intact, mucous membranes are moist Neck: Positive for jugular venous distention Heart: Regular rate and rhythm without murmur, gallop, or rub Lungs: Decreased breath sounds at the bases bilaterally Abdomen: Soft, nontender, nondistended Extremities: 1-2+ pitting edema to the knees. She has a left upper arm AV fistula with a good thrill and bruit Skin: Warm and dry Neuro: Patient is tremulous. Exam is positive for asterixis.  Assessment/Plan: 69 year old black female with progressive renal insufficiency. She is now to the point where she's having  issues with volume overload and  tremor likely secondary to uremia. She also has hyperkalemia. It seems like now is the time to initiate dialysis therapy. 1.Renal- patient with long history of progressive renal insufficiency. She had a mature AV fistula in place. I think now with this constellation of symptoms as well as hyperkalemia I believe is appropriate to initiate dialysis therapy. We will do a 2 hour treatment tonight with small needles mainly with focus of getting the potassium down. We'll plan for dialysis tomorrow as well later in the day. We will perform CLIP procedure and get her arranged for outpatient dialysis. 2. Hypertension/volume  - her blood pressure is actually quite good. She is on clonidine and diuretics. She is volume overloaded. I will hold her diuretics and we will ultrafilter her with dialysis. We will likely will be able to decrease her clonidine dose. 3. Anemia  - this has not been an issue for her. She has not required any ESA. Hemoglobin is over 12 she does not need it at this time. 4. Bones- we will continue her calcitriol and PhosLo for now  5. DM- per primary team. She likely will not need as much insulin if she is needed in the past due to her decreased GFR.    Aariz Maish A 12/25/2014, 3:38 PM

## 2014-12-25 NOTE — Telephone Encounter (Signed)
Patient no showed today's appt. Please advise on how to follow up. °A. No follow up necessary. °B. Follow up urgent. Contact patient immediately. °C. Follow up necessary. Contact patient and schedule visit in ___ days. °D. Follow up advised. Contact patient and schedule visit in ____weeks. ° °

## 2014-12-26 DIAGNOSIS — E162 Hypoglycemia, unspecified: Secondary | ICD-10-CM

## 2014-12-26 LAB — BASIC METABOLIC PANEL
ANION GAP: 6 (ref 5–15)
BUN: 66 mg/dL — ABNORMAL HIGH (ref 6–23)
CO2: 30 mmol/L (ref 19–32)
CREATININE: 4.27 mg/dL — AB (ref 0.50–1.10)
Calcium: 8.9 mg/dL (ref 8.4–10.5)
Chloride: 106 mmol/L (ref 96–112)
GFR calc Af Amer: 11 mL/min — ABNORMAL LOW (ref >90)
GFR calc non Af Amer: 10 mL/min — ABNORMAL LOW (ref >90)
Glucose, Bld: 91 mg/dL (ref 70–99)
POTASSIUM: 3.9 mmol/L (ref 3.5–5.1)
SODIUM: 142 mmol/L (ref 135–145)

## 2014-12-26 LAB — CBC
HCT: 33.9 % — ABNORMAL LOW (ref 36.0–46.0)
Hemoglobin: 10.5 g/dL — ABNORMAL LOW (ref 12.0–15.0)
MCH: 28.9 pg (ref 26.0–34.0)
MCHC: 31 g/dL (ref 30.0–36.0)
MCV: 93.4 fL (ref 78.0–100.0)
Platelets: 153 10*3/uL (ref 150–400)
RBC: 3.63 MIL/uL — ABNORMAL LOW (ref 3.87–5.11)
RDW: 18.1 % — ABNORMAL HIGH (ref 11.5–15.5)
WBC: 5.2 10*3/uL (ref 4.0–10.5)

## 2014-12-26 LAB — GLUCOSE, CAPILLARY
GLUCOSE-CAPILLARY: 37 mg/dL — AB (ref 70–99)
GLUCOSE-CAPILLARY: 20 mg/dL — AB (ref 70–99)
GLUCOSE-CAPILLARY: 229 mg/dL — AB (ref 70–99)
Glucose-Capillary: 94 mg/dL (ref 70–99)
Glucose-Capillary: 110 mg/dL — ABNORMAL HIGH (ref 70–99)
Glucose-Capillary: 21 mg/dL — CL (ref 70–99)
Glucose-Capillary: 57 mg/dL — ABNORMAL LOW (ref 70–99)
Glucose-Capillary: 73 mg/dL (ref 70–99)
Glucose-Capillary: 98 mg/dL (ref 70–99)
Glucose-Capillary: 183 mg/dL — ABNORMAL HIGH (ref 70–99)

## 2014-12-26 LAB — RENAL FUNCTION PANEL
Albumin: 2.3 g/dL — ABNORMAL LOW (ref 3.5–5.2)
Anion gap: 8 (ref 5–15)
BUN: 67 mg/dL — ABNORMAL HIGH (ref 6–23)
CHLORIDE: 101 mmol/L (ref 96–112)
CO2: 30 mmol/L (ref 19–32)
Calcium: 8.6 mg/dL (ref 8.4–10.5)
Creatinine, Ser: 4.2 mg/dL — ABNORMAL HIGH (ref 0.50–1.10)
GFR calc Af Amer: 12 mL/min — ABNORMAL LOW (ref >90)
GFR calc non Af Amer: 10 mL/min — ABNORMAL LOW (ref >90)
GLUCOSE: 208 mg/dL — AB (ref 70–99)
PHOSPHORUS: 5.6 mg/dL — AB (ref 2.3–4.6)
Potassium: 3.9 mmol/L (ref 3.5–5.1)
SODIUM: 139 mmol/L (ref 135–145)

## 2014-12-26 LAB — HEPATITIS B SURFACE ANTIGEN: Hepatitis B Surface Ag: NEGATIVE

## 2014-12-26 MED ORDER — DEXTROSE 5 % IV SOLN
Freq: Once | INTRAVENOUS | Status: AC
  Administered 2014-12-26: 06:00:00 via INTRAVENOUS

## 2014-12-26 MED ORDER — DEXTROSE 50 % IV SOLN
INTRAVENOUS | Status: AC
  Administered 2014-12-26: 25 mL
  Filled 2014-12-26: qty 50

## 2014-12-26 MED ORDER — DEXTROSE 50 % IV SOLN
INTRAVENOUS | Status: AC
  Administered 2014-12-26: 50 mL
  Filled 2014-12-26: qty 50

## 2014-12-26 MED ORDER — RENA-VITE PO TABS
1.0000 | ORAL_TABLET | Freq: Every day | ORAL | Status: DC
Start: 2014-12-26 — End: 2014-12-31
  Administered 2014-12-26 – 2014-12-30 (×5): 1 via ORAL
  Filled 2014-12-26 (×8): qty 1

## 2014-12-26 MED ORDER — DEXTROSE 5 % IV SOLN
Freq: Once | INTRAVENOUS | Status: AC
  Administered 2014-12-26: 09:00:00 via INTRAVENOUS

## 2014-12-26 NOTE — Progress Notes (Signed)
Utilization review completed. Sherrill Mckamie, RN, BSN. 

## 2014-12-26 NOTE — Progress Notes (Signed)
Hypoglycemic Event  CBG: 21 Treatment: D50 IV 25 mL  Symptoms: Hungry  Follow-up CBG: Time:05:51CBG Result:110 Possible Reasons for Event: Inadequate meal intake  Comments/MD notified:T. Callahan,NP    [name]  Remember to initiate Hypoglycemia Order Set & complete

## 2014-12-26 NOTE — Telephone Encounter (Signed)
Lvom requesting call back to scheduled office visit.

## 2014-12-26 NOTE — Progress Notes (Signed)
[facility] PROGRESS NOTE  [name] OTL:[id number] DOB: 1946-09-22 DOA: 12/25/2014 PCP: Alesia Richards, MD  Assessment/Plan: Principal Problem:   Hypoglycemia Active Problems:   Essential hypertension   Chronic diastolic heart failure   COPD (chronic obstructive pulmonary disease)   Hyperparathyroidism   Hyperkalemia   ESRD (end stage renal disease)   #1 Hypoglycemia Patient will be started on a diet, discontinue Lantus Currently on D5 at 50 mL an hour Continue Accu-Cheks Follow hypoglycemia protocol DC D5 and Accu-Cheks were stable  #2 DM type II with hyperglycemia. Hemoglobin A1c pending, hold Lantus tonight,  Continue SSI  3. CK D stage V now   ESRD initiating dialysis last night because of hyperkalemia.. Renal consulted perform CLIP procedure and get her arranged for outpatient dialysis.   4. Mild swelling in the lower extremities with mild shortness of breath. Due to #3 above start dialysis. Continue home dose diuretic and fluid restriction.   5. COPD. No acute issues. Supportive care with nebulizer treatments oxygen as needed.   6. Dyslipidemia. Continue home dose statin.   7. Essential hypertension. Continue home dose Catapres along with diuretics. She still making urine.  8. History of gout. Continue allopurinol.   9. Hypothyroidism. Continue home dose Synthroid.   10. GERD. Continue on H2 blocker.   Code Status: full Family Communication: family updated about patient's clinical progress Disposition Plan:  Anticipate discharge tomorrow  Brief narrative: [name] is a 69 y.o. female with past medical history significant for diabetes mellitus with nephropathy, hypertension, hyperlipidemia, anxiety as well as COPD. She also has progressive renal insufficiency likely secondary to diabetes. She is followed by Dr. Edrick Oh at [facility]. She is status post AV fistula placement she tells me 4 years ago. Patient was  seen by Dr. Justin Mend in December 2015 at which time creatinine was 5.17 and BUN was 72. She was not felt to be uremic at that time. Patient states that over the last several days she's noticed worsening lower extremity edema. She was going to the diabetic doctor this morning when she was noted to be very shaky and anxious so was diverted to the emergency department. In the emergency room she was found to be hypoglycemic. This was corrected. Then labs returned with a BUN of over 100 and a potassium level of 6.5. Creatinine is a little bit higher in the high fives. Patient states that she feels better now that her sugar is better  Consultants:  Nephrology  Procedures:  Hemodialysis  Antibiotics: None  HPI/Subjective: Patient has been nothing by mouth since yesterday, very hungry, requesting food  Objective: Filed Vitals:   12/25/14 2128 12/25/14 2150 12/25/14 2255 12/26/14 0518  BP: 131/72 152/77 149/71 139/45  Pulse: 86 85 94 74  Temp: 97.1 F (36.2 C)  98.2 F (36.8 C) 98.7 F (37.1 C)  TempSrc: Oral  Oral Oral  Resp: 18 20 20 18   Height:   5' 5.5" (1.664 m)   Weight: 95.2 kg (209 lb 14.1 oz)     SpO2: 93%  100% 97%    Intake/Output Summary (Last 24 hours) at 12/26/14 1242 Last data filed at 12/26/14 1016  Gross per 24 hour  Intake 474.33 ml  Output   1987 ml  Net -1512.67 ml    Exam:  General: No acute respiratory distress Lungs: Clear to auscultation bilaterally without wheezes or crackles Cardiovascular: Regular rate and rhythm without murmur gallop or rub normal S1 and S2 Abdomen: Nontender, nondistended, soft,  bowel sounds positive, no rebound, no ascites, no appreciable mass Extremities: No significant cyanosis, clubbing, or edema bilateral lower extremities      Data Reviewed: Basic Metabolic Panel:  Recent Labs Lab 12/25/14 1108 12/25/14 1322  NA 141 141  K 5.7* 6.5*  CL 107 106  CO2  --  25  GLUCOSE <20* 101*  BUN 106* 106*  CREATININE 5.10* 5.64*   CALCIUM  --  9.1    Liver Function Tests:  Recent Labs Lab 12/25/14 1322  AST 122*  ALT 94*  ALKPHOS 109  BILITOT 0.5  PROT 5.1*  ALBUMIN 2.7*   No results for input(s): LIPASE, AMYLASE in the last 168 hours. No results for input(s): AMMONIA in the last 168 hours.  CBC:  Recent Labs Lab 12/25/14 1048 12/25/14 1108  WBC 7.0  --   HGB 12.1 14.3  HCT 39.1 42.0  MCV 93.5  --   PLT 181  --     Cardiac Enzymes: No results for input(s): CKTOTAL, CKMB, CKMBINDEX, TROPONINI in the last 168 hours. BNP (last 3 results)  Recent Labs  12/25/14 1048  BNP 1166.9*    ProBNP (last 3 results)  Recent Labs  04/16/14 1916  PROBNP 2399.0*      CBG:  Recent Labs Lab 12/26/14 0531 12/26/14 0551 12/26/14 0753 12/26/14 0920 12/26/14 1150  GLUCAP 21* 110* 57* 73 98    Recent Results (from the past 240 hour(s))  MRSA PCR Screening     Status: None   Collection Time: 12/25/14  6:10 PM  Result Value Ref Range Status   MRSA by PCR NEGATIVE NEGATIVE Final    Comment:        The GeneXpert MRSA Assay (FDA approved for NASAL specimens only), is one component of a comprehensive MRSA colonization surveillance program. It is not intended to diagnose MRSA infection nor to guide or monitor treatment for MRSA infections.      Studies: Ct Head Wo Contrast  12/25/2014   CLINICAL DATA:  Altered mental status and questionable seizure  EXAM: CT HEAD WITHOUT CONTRAST  TECHNIQUE: Contiguous axial images were obtained from the base of the skull through the vertex without intravenous contrast.  COMPARISON:  None.  FINDINGS: There is age related volume loss. There is no intracranial mass, hemorrhage, extra-axial fluid collection, or midline shift. Gray-white compartments appear normal. No acute infarct apparent. There is mild basal ganglia calcification bilaterally. Bony calvarium appears intact. 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  IMPRESSION: No intracranial mass, hemorrhage, or  focal gray -white compartment lesions/acute appearing infarct. Mild basal ganglia calcification is felt to be physiologic in this age group. 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   Electronically Signed   By: Lowella Grip M.D.   On: 12/25/2014 14:42   Dg Chest Portable 1 View  12/25/2014   CLINICAL DATA:  Leg swelling and shortness of breath.  Chest pain  EXAM: PORTABLE CHEST - 1 VIEW  COMPARISON:  04/16/2014  FINDINGS: Chronic cardiomegaly, hilar enlargement, and aortic tortuosity. When accounting for technique, there is no edema, consolidation, effusion, or pneumothorax. No osseous findings to explain chest pain  IMPRESSION: Cardiomegaly without failure.   Electronically Signed   By: Jorje Guild M.D.   On: 12/25/2014 11:32   Mm Screening Breast Tomo Bilateral  12/10/2014   CLINICAL DATA:  Screening.  EXAM: DIGITAL SCREENING BILATERAL MAMMOGRAM WITH 3D TOMO WITH CAD  COMPARISON:  Previous exam(s).  ACR Breast Density Category b: There are scattered areas of fibroglandular density.  FINDINGS:  There are no findings suspicious for malignancy. Images were processed with CAD.  IMPRESSION: No mammographic evidence of malignancy. A result letter of this screening mammogram will be mailed directly to the patient.  RECOMMENDATION: Screening mammogram in one year. (Code:SM-B-01Y)  BI-RADS CATEGORY  1: Negative.   Electronically Signed   By: Abelardo Diesel M.D.   On: 12/10/2014 09:43    Scheduled Meds: . allopurinol  300 mg Oral Daily  . antiseptic oral rinse  7 mL Mouth Rinse BID  . aspirin EC  81 mg Oral Daily  . atorvastatin  80 mg Oral q1800  . calcitRIOL  0.25 mcg Oral Q M,W,F  . calcium acetate  1,334 mg Oral TID WC  . citalopram  40 mg Oral Daily  . cloNIDine  0.3 mg Oral BID  . colchicine  0.6 mg Oral Daily  . famotidine  20 mg Oral Daily  . fluticasone  1 spray Each Nare BID  . furosemide  160 mg Oral BID  . heparin  5,000 Units Subcutaneous 3 times per day  . insulin aspart  0-9 Units  Subcutaneous TID WC  . levothyroxine  50 mcg Oral QAC breakfast  . loratadine  10 mg Oral Daily  . metolazone  5 mg Oral Q M,W,F  . sodium chloride  3 mL Intravenous Q12H   Continuous Infusions:   Principal Problem:   Hypoglycemia Active Problems:   Essential hypertension   Chronic diastolic heart failure   COPD (chronic obstructive pulmonary disease)   Hyperparathyroidism   Hyperkalemia   ESRD (end stage renal disease)    Time spent: 40 minutes   [place] [facility] Pager [phone]. If 7PM-7AM, please contact night-coverage at [email or web address], password [facility] 12/26/2014, 12:42 PM  LOS: 1 day

## 2014-12-26 NOTE — Procedures (Signed)
Pt AVF cannulated and able to start tx, both arterial and venous site infiltrated, unable to continue with hd tx, removed needles and applied ice. Notified Dr. Mercy Moore, orders to use todays orders tomorrow 12/27/14, orders modified.

## 2014-12-26 NOTE — Progress Notes (Signed)
Hypoglycemic Event  CBG: 57  Treatment: D50 IV 25 mL  Symptoms: Hungry  Follow-up CBG: APOL:4103 CBG Result:73  Possible Reasons for Event: Inadequate meal intake  Comments/MD notified:MD aware     [name] A  Remember to initiate Hypoglycemia Order Set & complete

## 2014-12-26 NOTE — Progress Notes (Signed)
S: Feeling much better.  Eating some O:BP 139/45 mmHg  Pulse 74  Temp(Src) 98.7 F (37.1 C) (Oral)  Resp 18  Ht 5' 5.5" (1.664 m)  Wt 95.2 kg (209 lb 14.1 oz)  BMI 34.38 kg/m2  SpO2 97%  Intake/Output Summary (Last 24 hours) at 12/26/14 1240 Last data filed at 12/26/14 1016  Gross per 24 hour  Intake 474.33 ml  Output   1987 ml  Net -1512.67 ml   Weight change:  GXQ:JJHER and alert CVS:RRR Resp:clear Abd:+ BS NTND Ext:1-2+ edema  LUA AVF + bruit NEURO:CNI Ox3 no myoclonus + asterixis   . allopurinol  300 mg Oral Daily  . antiseptic oral rinse  7 mL Mouth Rinse BID  . aspirin EC  81 mg Oral Daily  . atorvastatin  80 mg Oral q1800  . calcitRIOL  0.25 mcg Oral Q M,W,F  . calcium acetate  1,334 mg Oral TID WC  . citalopram  40 mg Oral Daily  . cloNIDine  0.3 mg Oral BID  . colchicine  0.6 mg Oral Daily  . famotidine  20 mg Oral Daily  . fluticasone  1 spray Each Nare BID  . furosemide  160 mg Oral BID  . heparin  5,000 Units Subcutaneous 3 times per day  . insulin aspart  0-9 Units Subcutaneous TID WC  . levothyroxine  50 mcg Oral QAC breakfast  . loratadine  10 mg Oral Daily  . metolazone  5 mg Oral Q M,W,F  . sodium chloride  3 mL Intravenous Q12H   Ct Head Wo Contrast  12/25/2014   CLINICAL DATA:  Altered mental status and questionable seizure  EXAM: CT HEAD WITHOUT CONTRAST  TECHNIQUE: Contiguous axial images were obtained from the base of the skull through the vertex without intravenous contrast.  COMPARISON:  None.  FINDINGS: There is age related volume loss. There is no intracranial mass, hemorrhage, extra-axial fluid collection, or midline shift. Gray-white compartments appear normal. No acute infarct apparent. There is mild basal ganglia calcification bilaterally. Bony calvarium appears intact. 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  IMPRESSION: No intracranial mass, hemorrhage, or focal gray -white compartment lesions/acute appearing infarct. Mild basal ganglia  calcification is felt to be physiologic in this age group. 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   Electronically Signed   By: Lowella Grip M.D.   On: 12/25/2014 14:42   Dg Chest Portable 1 View  12/25/2014   CLINICAL DATA:  Leg swelling and shortness of breath.  Chest pain  EXAM: PORTABLE CHEST - 1 VIEW  COMPARISON:  04/16/2014  FINDINGS: Chronic cardiomegaly, hilar enlargement, and aortic tortuosity. When accounting for technique, there is no edema, consolidation, effusion, or pneumothorax. No osseous findings to explain chest pain  IMPRESSION: Cardiomegaly without failure.   Electronically Signed   By: Jorje Guild M.D.   On: 12/25/2014 11:32   BMET    Component Value Date/Time   NA 141 12/25/2014 1322   K 6.5* 12/25/2014 1322   CL 106 12/25/2014 1322   CO2 25 12/25/2014 1322   GLUCOSE 101* 12/25/2014 1322   BUN 106* 12/25/2014 1322   CREATININE 5.64* 12/25/2014 1322   CREATININE 5.15* 12/10/2014 1013   CALCIUM 9.1 12/25/2014 1322   CALCIUM 9.0 10/31/2013 0947   GFRNONAA 7* 12/25/2014 1322   GFRNONAA 8* 12/10/2014 1013   GFRAA 8* 12/25/2014 1322   GFRAA 9* 12/10/2014 1013   CBC    Component Value Date/Time   WBC 7.0 12/25/2014 1048   RBC 4.18 12/25/2014  1048   HGB 14.3 12/25/2014 1108   HCT 42.0 12/25/2014 1108   PLT 181 12/25/2014 1048   MCV 93.5 12/25/2014 1048   MCH 28.9 12/25/2014 1048   MCHC 30.9 12/25/2014 1048   RDW 18.1* 12/25/2014 1048   LYMPHSABS 1.2 12/10/2014 1013   MONOABS 0.5 12/10/2014 1013   EOSABS 0.0 12/10/2014 1013   BASOSABS 0.0 12/10/2014 1013     Assessment: 1. ESRD 2. Hyperkalemia 3. Sec HPTH 4. HTN 5. DM  Plan: 1. Plan HD today 2. DC metolazone 3. Start renavite 4 Check renal and PTH at HD today   Alyssa Rotondo T

## 2014-12-27 DIAGNOSIS — J438 Other emphysema: Secondary | ICD-10-CM

## 2014-12-27 LAB — COMPREHENSIVE METABOLIC PANEL
ALT: 68 U/L — AB (ref 0–35)
AST: 41 U/L — ABNORMAL HIGH (ref 0–37)
Albumin: 2.3 g/dL — ABNORMAL LOW (ref 3.5–5.2)
Alkaline Phosphatase: 82 U/L (ref 39–117)
Anion gap: 8 (ref 5–15)
BUN: 67 mg/dL — ABNORMAL HIGH (ref 6–23)
CO2: 29 mmol/L (ref 19–32)
Calcium: 8.7 mg/dL (ref 8.4–10.5)
Chloride: 104 mmol/L (ref 96–112)
Creatinine, Ser: 4.34 mg/dL — ABNORMAL HIGH (ref 0.50–1.10)
GFR calc Af Amer: 11 mL/min — ABNORMAL LOW (ref >90)
GFR, EST NON AFRICAN AMERICAN: 10 mL/min — AB (ref >90)
Glucose, Bld: 174 mg/dL — ABNORMAL HIGH (ref 70–99)
Potassium: 3.4 mmol/L — ABNORMAL LOW (ref 3.5–5.1)
Sodium: 141 mmol/L (ref 135–145)
Total Bilirubin: 0.5 mg/dL (ref 0.3–1.2)
Total Protein: 5 g/dL — ABNORMAL LOW (ref 6.0–8.3)

## 2014-12-27 LAB — CBC
HEMATOCRIT: 33.4 % — AB (ref 36.0–46.0)
Hemoglobin: 10.2 g/dL — ABNORMAL LOW (ref 12.0–15.0)
MCH: 29 pg (ref 26.0–34.0)
MCHC: 30.5 g/dL (ref 30.0–36.0)
MCV: 94.9 fL (ref 78.0–100.0)
Platelets: 127 10*3/uL — ABNORMAL LOW (ref 150–400)
RBC: 3.52 MIL/uL — AB (ref 3.87–5.11)
RDW: 18.5 % — ABNORMAL HIGH (ref 11.5–15.5)
WBC: 4.6 10*3/uL (ref 4.0–10.5)

## 2014-12-27 LAB — GLUCOSE, CAPILLARY
GLUCOSE-CAPILLARY: 66 mg/dL — AB (ref 70–99)
Glucose-Capillary: 90 mg/dL (ref 70–99)
Glucose-Capillary: 191 mg/dL — ABNORMAL HIGH (ref 70–99)
Glucose-Capillary: 201 mg/dL — ABNORMAL HIGH (ref 70–99)

## 2014-12-27 LAB — HEPATITIS B SURFACE ANTIBODY,QUALITATIVE: Hep B S Ab: NONREACTIVE

## 2014-12-27 LAB — HEMOGLOBIN A1C
Hgb A1c MFr Bld: 7.5 % — ABNORMAL HIGH (ref 4.8–5.6)
MEAN PLASMA GLUCOSE: 169 mg/dL

## 2014-12-27 LAB — PTH, INTACT AND CALCIUM
CALCIUM TOTAL (PTH): 8.6 mg/dL — AB (ref 8.7–10.3)
PTH: 237 pg/mL — ABNORMAL HIGH (ref 15–65)

## 2014-12-27 LAB — HEPATITIS B CORE ANTIBODY, TOTAL: Hep B Core Total Ab: NEGATIVE

## 2014-12-27 MED ORDER — HYDROCERIN EX CREA
TOPICAL_CREAM | Freq: Two times a day (BID) | CUTANEOUS | Status: DC
  Administered 2014-12-27 – 2014-12-31 (×8): via TOPICAL
  Filled 2014-12-27: qty 113

## 2014-12-27 NOTE — Procedures (Signed)
Pt seen on HD.Ap210 Vp 160  BFR 250.  Feels well.  Good appetite.  Awaiting outpt spot.

## 2014-12-27 NOTE — Progress Notes (Signed)
[facility] PROGRESS NOTE  TIAWANNA [name] VXY:[id number] DOB: 1946/04/11 DOA: 12/25/2014 PCP: Alesia Richards, MD   HPI/Subjective: Patient with c/o pain in left heel today which has been going on for a few days- no other complaints.   Assessment/Plan:  Hypoglycemia in DM 2 - initially required D5 infusion discontinued Lantus for now - A1c 7/5 Continue Accu-Checks and low dose sliding scale at mealtime  CKD stage V now ESRD -Dialysis started on this admission - Renal consulted - perform CLIP procedure and get her arranged for outpatient dialysis  Cracked left heel - no ulcer or cellulits noted- very tender around crack- start eucerin BID  UTI? - positive U cx but asymptomatic- do not treat    COPD.  -No acute issues. Supportive care with nebulizer treatments - oxygen as needed.   Dyslipidemia.  -Continue home dose statin.   Essential hypertension.  -Continue home dose Catapres along with diuretics. She still making urine.  History of gout.  -Continue allopurinol.   Hypothyroidism.  -Continue home dose Synthroid.   GERD.  -Continue on H2 blocker.   Code Status: full Family Communication:  Disposition Plan:  Anticipate discharge when Clipped  Brief narrative: [name] is a 69 y.o. female with past medical history significant for diabetes mellitus with nephropathy, hypertension, hyperlipidemia, anxiety as well as COPD. She also has progressive renal insufficiency likely secondary to diabetes. She is followed by Dr. Edrick Oh at [facility] She is status post AV fistula placement she tells me 4 years ago. Patient was seen by Dr. Justin Mend in December 2015 at which time creatinine was 5.17 and BUN was 72. She was not felt to be uremic at that time. Patient states that over the last several days she's noticed worsening lower extremity edema. She was going to the diabetic doctor this morning when she was noted to be very shaky and anxious  so was diverted to the emergency department. In the emergency room she was found to be hypoglycemic. This was corrected. Then labs returned with a BUN of over 100 and a potassium level of 6.5. Creatinine is a little bit higher in the high fives. Patient states that she feels better now that her sugar is better  Consultants:  Nephrology  Procedures:  Hemodialysis  Antibiotics: None    Objective: Filed Vitals:   12/27/14 0900 12/27/14 0921 12/27/14 1016 12/27/14 1049  BP: 112/64 136/64 119/71 128/65  Pulse: 79 84 89 85  Temp:  97.4 F (36.3 C) 97.3 F (36.3 C)   TempSrc:  Oral Oral   Resp: 10 16 16    Height:      Weight:  90.3 kg (199 lb 1.2 oz)    SpO2:  98% 100% 97%    Intake/Output Summary (Last 24 hours) at 12/27/14 1537 Last data filed at 12/27/14 8270  Gross per 24 hour  Intake   1085 ml  Output   2400 ml  Net  -1315 ml    Exam:  General: No acute respiratory distress Lungs: Clear to auscultation bilaterally without wheezes or crackles Cardiovascular: Regular rate and rhythm without murmur gallop or rub normal S1 and S2 Abdomen: Nontender, nondistended, soft, bowel sounds positive, no rebound, no ascites, no appreciable mass Extremities: No significant cyanosis, clubbing, or edema bilateral lower extremities Skin- dry skin with cracked left heel which is tender- no edema, cellulitis or ulcer      Data Reviewed: Basic Metabolic Panel:  Recent Labs Lab 12/25/14 1108 12/25/14 1322 12/26/14 1229  12/26/14 1456 12/26/14 1500 12/27/14 0515  NA 141 141 142 139  --  141  K 5.7* 6.5* 3.9 3.9  --  3.4*  CL 107 106 106 101  --  104  CO2  --  25 30 30   --  29  GLUCOSE <20* 101* 91 208*  --  174*  BUN 106* 106* 66* 67*  --  67*  CREATININE 5.10* 5.64* 4.27* 4.20*  --  4.34*  CALCIUM  --  9.1 8.9 8.6 8.6* 8.7  PHOS  --   --   --  5.6*  --   --     Liver Function Tests:  Recent Labs Lab 12/25/14 1322 12/26/14 1456 12/27/14 0515  AST 122*  --  41*   ALT 94*  --  68*  ALKPHOS 109  --  82  BILITOT 0.5  --  0.5  PROT 5.1*  --  5.0*  ALBUMIN 2.7* 2.3* 2.3*   No results for input(s): LIPASE, AMYLASE in the last 168 hours. No results for input(s): AMMONIA in the last 168 hours.  CBC:  Recent Labs Lab 12/25/14 1048 12/25/14 1108 12/26/14 1229 12/27/14 0515  WBC 7.0  --  5.2 4.6  HGB 12.1 14.3 10.5* 10.2*  HCT 39.1 42.0 33.9* 33.4*  MCV 93.5  --  93.4 94.9  PLT 181  --  153 127*    Cardiac Enzymes: No results for input(s): CKTOTAL, CKMB, CKMBINDEX, TROPONINI in the last 168 hours. BNP (last 3 results)  Recent Labs  12/25/14 1048  BNP 1166.9*    ProBNP (last 3 results)  Recent Labs  04/16/14 1916  PROBNP 2399.0*      CBG:  Recent Labs Lab 12/26/14 1150 12/26/14 1658 12/26/14 2234 12/27/14 1040 12/27/14 1204  GLUCAP 98 183* 229* 66* 90    Recent Results (from the past 240 hour(s))  Urine culture     Status: None (Preliminary result)   Collection Time: 12/25/14  1:43 PM  Result Value Ref Range Status   Specimen Description URINE, RANDOM  Final   Special Requests NONE  Final   Colony Count   Final    >=100,000 COLONIES/ML Performed at [facility]    Culture   Final    [place] Performed at [facility]    Report Status PENDING  Incomplete  MRSA PCR Screening     Status: None   Collection Time: 12/25/14  6:10 PM  Result Value Ref Range Status   MRSA by PCR NEGATIVE NEGATIVE Final    Comment:        The GeneXpert MRSA Assay (FDA approved for NASAL specimens only), is one component of a comprehensive MRSA colonization surveillance program. It is not intended to diagnose MRSA infection nor to guide or monitor treatment for MRSA infections.      Studies: Ct Head Wo Contrast  12/25/2014   CLINICAL DATA:  Altered mental status and questionable seizure  EXAM: CT HEAD WITHOUT CONTRAST  TECHNIQUE: Contiguous axial images were obtained from the base of the skull  through the vertex without intravenous contrast.  COMPARISON:  None.  FINDINGS: There is age related volume loss. There is no intracranial mass, hemorrhage, extra-axial fluid collection, or midline shift. Gray-white compartments appear normal. No acute infarct apparent. There is mild basal ganglia calcification bilaterally. Bony calvarium appears intact. 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  IMPRESSION: No intracranial mass, hemorrhage, or focal gray -white compartment lesions/acute appearing infarct. Mild basal ganglia calcification is felt to  be physiologic in this age group. 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   Electronically Signed   By: Lowella Grip M.D.   On: 12/25/2014 14:42   Dg Chest Portable 1 View  12/25/2014   CLINICAL DATA:  Leg swelling and shortness of breath.  Chest pain  EXAM: PORTABLE CHEST - 1 VIEW  COMPARISON:  04/16/2014  FINDINGS: Chronic cardiomegaly, hilar enlargement, and aortic tortuosity. When accounting for technique, there is no edema, consolidation, effusion, or pneumothorax. No osseous findings to explain chest pain  IMPRESSION: Cardiomegaly without failure.   Electronically Signed   By: Jorje Guild M.D.   On: 12/25/2014 11:32   Mm Screening Breast Tomo Bilateral  12/10/2014   CLINICAL DATA:  Screening.  EXAM: DIGITAL SCREENING BILATERAL MAMMOGRAM WITH 3D TOMO WITH CAD  COMPARISON:  Previous exam(s).  ACR Breast Density Category b: There are scattered areas of fibroglandular density.  FINDINGS: There are no findings suspicious for malignancy. Images were processed with CAD.  IMPRESSION: No mammographic evidence of malignancy. A result letter of this screening mammogram will be mailed directly to the patient.  RECOMMENDATION: Screening mammogram in one year. (Code:SM-B-01Y)  BI-RADS CATEGORY  1: Negative.   Electronically Signed   By: Abelardo Diesel M.D.   On: 12/10/2014 09:43    Scheduled Meds: . allopurinol  300 mg Oral Daily  . antiseptic oral rinse  7 mL Mouth Rinse  BID  . aspirin EC  81 mg Oral Daily  . atorvastatin  80 mg Oral q1800  . calcitRIOL  0.25 mcg Oral Q M,W,F  . calcium acetate  1,334 mg Oral TID WC  . citalopram  40 mg Oral Daily  . cloNIDine  0.3 mg Oral BID  . colchicine  0.6 mg Oral Daily  . famotidine  20 mg Oral Daily  . fluticasone  1 spray Each Nare BID  . furosemide  160 mg Oral BID  . heparin  5,000 Units Subcutaneous 3 times per day  . hydrocerin   Topical BID  . insulin aspart  0-9 Units Subcutaneous TID WC  . levothyroxine  50 mcg Oral QAC breakfast  . loratadine  10 mg Oral Daily  . multivitamin  1 tablet Oral QHS  . sodium chloride  3 mL Intravenous Q12H   Continuous Infusions:      Time spent: 30 minutes   [facility]  [facility] Pager- [email or web address], password [facility] 12/27/2014, 3:37 PM  LOS: 2 days

## 2014-12-28 DIAGNOSIS — J218 Acute bronchiolitis due to other specified organisms: Secondary | ICD-10-CM

## 2014-12-28 DIAGNOSIS — J209 Acute bronchitis, unspecified: Secondary | ICD-10-CM

## 2014-12-28 LAB — URINE MICROSCOPIC-ADD ON

## 2014-12-28 LAB — GLUCOSE, CAPILLARY
Glucose-Capillary: 127 mg/dL — ABNORMAL HIGH (ref 70–99)
Glucose-Capillary: 217 mg/dL — ABNORMAL HIGH (ref 70–99)
Glucose-Capillary: 234 mg/dL — ABNORMAL HIGH (ref 70–99)
Glucose-Capillary: 175 mg/dL — ABNORMAL HIGH (ref 70–99)

## 2014-12-28 LAB — BASIC METABOLIC PANEL
ANION GAP: 9 (ref 5–15)
BUN: 58 mg/dL — ABNORMAL HIGH (ref 6–23)
CO2: 27 mmol/L (ref 19–32)
Calcium: 8.6 mg/dL (ref 8.4–10.5)
Chloride: 101 mmol/L (ref 96–112)
Creatinine, Ser: 4.07 mg/dL — ABNORMAL HIGH (ref 0.50–1.10)
GFR calc non Af Amer: 10 mL/min — ABNORMAL LOW (ref >90)
GFR, EST AFRICAN AMERICAN: 12 mL/min — AB (ref >90)
GLUCOSE: 206 mg/dL — AB (ref 70–99)
Potassium: 3.6 mmol/L (ref 3.5–5.1)
SODIUM: 137 mmol/L (ref 135–145)

## 2014-12-28 LAB — CBC
HCT: 34.9 % — ABNORMAL LOW (ref 36.0–46.0)
Hemoglobin: 11 g/dL — ABNORMAL LOW (ref 12.0–15.0)
MCH: 29.5 pg (ref 26.0–34.0)
MCHC: 31.5 g/dL (ref 30.0–36.0)
MCV: 93.6 fL (ref 78.0–100.0)
Platelets: 98 10*3/uL — ABNORMAL LOW (ref 150–400)
RBC: 3.73 MIL/uL — AB (ref 3.87–5.11)
RDW: 18.2 % — AB (ref 11.5–15.5)
WBC: 5.1 10*3/uL (ref 4.0–10.5)

## 2014-12-28 LAB — URINALYSIS, ROUTINE W REFLEX MICROSCOPIC
BILIRUBIN URINE: NEGATIVE
GLUCOSE, UA: NEGATIVE mg/dL
KETONES UR: NEGATIVE mg/dL
Nitrite: NEGATIVE
SPECIFIC GRAVITY, URINE: 1.015 (ref 1.005–1.030)
UROBILINOGEN UA: 0.2 mg/dL (ref 0.0–1.0)
pH: 5.5 (ref 5.0–8.0)

## 2014-12-28 LAB — URINE CULTURE: Colony Count: >=100000

## 2014-12-28 MED ORDER — AZITHROMYCIN 250 MG PO TABS
250.0000 mg | ORAL_TABLET | Freq: Every day | ORAL | Status: DC
  Administered 2014-12-29 – 2014-12-31 (×3): 250 mg via ORAL
  Filled 2014-12-28 (×3): qty 1

## 2014-12-28 MED ORDER — GUAIFENESIN-DM 100-10 MG/5ML PO SYRP
5.0000 mL | ORAL_SOLUTION | Freq: Four times a day (QID) | ORAL | Status: DC
  Administered 2014-12-28 – 2014-12-31 (×10): 5 mL via ORAL
  Filled 2014-12-28 (×17): qty 5

## 2014-12-28 MED ORDER — AZITHROMYCIN 500 MG PO TABS
500.0000 mg | ORAL_TABLET | Freq: Every day | ORAL | Status: AC
  Administered 2014-12-28: 500 mg via ORAL
  Filled 2014-12-28: qty 1

## 2014-12-28 NOTE — Progress Notes (Signed)
S: Feels well O:BP 115/54 mmHg  Pulse 81  Temp(Src) 98.8 F (37.1 C) (Oral)  Resp 16  Ht 5' 5.5" (1.664 m)  Wt 90.22 kg (198 lb 14.4 oz)  BMI 32.58 kg/m2  SpO2 100%  Intake/Output Summary (Last 24 hours) at 12/28/14 0942 Last data filed at 12/28/14 0733  Gross per 24 hour  Intake    600 ml  Output    100 ml  Net    500 ml   Weight change: -2.8 kg (-6 lb 2.8 oz) URK:YHCWC and alert CVS:RRR Resp:clear Abd:+ BS NTND Ext:1-2+ edema  LUA AVF + bruit, mild infiltration NEURO:CNI Ox3 no asterixis   . allopurinol  300 mg Oral Daily  . antiseptic oral rinse  7 mL Mouth Rinse BID  . aspirin EC  81 mg Oral Daily  . atorvastatin  80 mg Oral q1800  . [START ON 12/29/2014] azithromycin  250 mg Oral Daily  . calcitRIOL  0.25 mcg Oral Q M,W,F  . calcium acetate  1,334 mg Oral TID WC  . citalopram  40 mg Oral Daily  . cloNIDine  0.3 mg Oral BID  . colchicine  0.6 mg Oral Daily  . famotidine  20 mg Oral Daily  . fluticasone  1 spray Each Nare BID  . furosemide  160 mg Oral BID  . guaiFENesin-dextromethorphan  5 mL Oral Q6H  . heparin  5,000 Units Subcutaneous 3 times per day  . hydrocerin   Topical BID  . insulin aspart  0-9 Units Subcutaneous TID WC  . levothyroxine  50 mcg Oral QAC breakfast  . loratadine  10 mg Oral Daily  . multivitamin  1 tablet Oral QHS  . sodium chloride  3 mL Intravenous Q12H   No results found. BMET    Component Value Date/Time   NA 141 12/27/2014 0515   K 3.4* 12/27/2014 0515   CL 104 12/27/2014 0515   CO2 29 12/27/2014 0515   GLUCOSE 174* 12/27/2014 0515   BUN 67* 12/27/2014 0515   CREATININE 4.34* 12/27/2014 0515   CREATININE 5.15* 12/10/2014 1013   CALCIUM 8.7 12/27/2014 0515   CALCIUM 8.6* 12/26/2014 1500   GFRNONAA 10* 12/27/2014 0515   GFRNONAA 8* 12/10/2014 1013   GFRAA 11* 12/27/2014 0515   GFRAA 9* 12/10/2014 1013   CBC    Component Value Date/Time   WBC 4.6 12/27/2014 0515   RBC 3.52* 12/27/2014 0515   HGB 10.2* 12/27/2014 0515    HCT 33.4* 12/27/2014 0515   PLT 127* 12/27/2014 0515   MCV 94.9 12/27/2014 0515   MCH 29.0 12/27/2014 0515   MCHC 30.5 12/27/2014 0515   RDW 18.5* 12/27/2014 0515   LYMPHSABS 1.2 12/10/2014 1013   MONOABS 0.5 12/10/2014 1013   EOSABS 0.0 12/10/2014 1013   BASOSABS 0.0 12/10/2014 1013     Assessment: 1. ESRD 2. Hyperkalemia 3. Sec HPTH 4. HTN 5. DM  Plan: 1. Plan next HD tues as anticipate TTS as outpt.  Hopefully will get word tomorrow on outpt spot. 2.  Ask SW to see about transportation 3. Says she still makes a fair amt of urine so will cont lasix   Raelyn Racette T

## 2014-12-28 NOTE — Progress Notes (Signed)
[facility] PROGRESS NOTE  [name] VEL:[id number] DOB: [birth year] DOA: 12/25/2014 PCP: Alesia Richards, MD   HPI/Subjective: Noted to have a low grade fever- admits to cough with yellow sputum for a few days now which is getting worse  Assessment/Plan:  Hypoglycemia in DM 2 - initially required D5 infusion discontinued Lantus for now - A1c 7.5 Continue Accu-Checks and low dose sliding scale at mealtime  Fever/ acute bronchitis - start Z pak.   CKD stage V now ESRD -Dialysis started on this admission - Renal consulted - perform CLIP procedure and get her arranged for outpatient dialysis  Cracked left heel - no ulcer or cellulits noted- very tender around crack- started eucerin BID  UTI? - positive U cx but asymptomatic- do not treat   COPD.  -No acute issues. Supportive care with nebulizer treatments - oxygen as needed.   Dyslipidemia.  -Continue home dose statin.   Essential hypertension.  -Continue home dose Catapres along with diuretics. She still making urine.  History of gout.  -Continue allopurinol.   Hypothyroidism.  -Continue home dose Synthroid.   GERD.  -Continue on H2 blocker.   Code Status: full Family Communication:  Disposition Plan:  Anticipate discharge when Clipped  Brief narrative: [name] is a 69 y.o. female with past medical history significant for diabetes mellitus with nephropathy, hypertension, hyperlipidemia, anxiety as well as COPD. She also has progressive renal insufficiency likely secondary to diabetes. She is followed by Dr. Edrick Oh at [facility]. She is status post AV fistula placement she tells me 4 years ago. Patient was seen by Dr. Justin Mend in December 2015 at which time creatinine was 5.17 and BUN was 72. She was not felt to be uremic at that time. Patient states that over the last several days she's noticed worsening lower extremity edema. She was going to the diabetic doctor this  morning when she was noted to be very shaky and anxious so was diverted to the emergency department. In the emergency room she was found to be hypoglycemic. This was corrected. Then labs returned with a BUN of over 100 and a potassium level of 6.5. Creatinine is a little bit higher. Patient states that she feels better now that her sugar is better  Consultants:  Nephrology  Procedures:  Hemodialysis  Antibiotics: None    Objective: Filed Vitals:   12/27/14 2141 12/28/14 0500 12/28/14 0734 12/28/14 1028  BP: 137/56 115/54  124/46  Pulse: 91 81  81  Temp: 98.8 F (37.1 C) 100.4 F (38 C) 98.8 F (37.1 C) 99.2 F (37.3 C)  TempSrc: Oral Oral Oral Oral  Resp: 18 16  16   Height:      Weight: 90.22 kg (198 lb 14.4 oz)     SpO2: 100% 100%  96%    Intake/Output Summary (Last 24 hours) at 12/28/14 1219 Last data filed at 12/28/14 2585  Gross per 24 hour  Intake    600 ml  Output    100 ml  Net    500 ml    Exam:  General: No acute respiratory distress Lungs: Clear to auscultation bilaterally without wheezes or crackles Cardiovascular: Regular rate and rhythm without murmur gallop or rub normal S1 and S2 Abdomen: Nontender, nondistended, soft, bowel sounds positive, no rebound, no ascites, no appreciable mass Extremities: No significant cyanosis, clubbing, or edema bilateral lower extremities Skin- dry skin with cracked left heel which is tender- no edema, cellulitis or ulcer  Data Reviewed: Basic Metabolic Panel:  Recent Labs Lab 12/25/14 1322 12/26/14 1229 12/26/14 1456 12/26/14 1500 12/27/14 0515 12/28/14 0915  NA 141 142 139  --  141 137  K 6.5* 3.9 3.9  --  3.4* 3.6  CL 106 106 101  --  104 101  CO2 25 30 30   --  29 27  GLUCOSE 101* 91 208*  --  174* 206*  BUN 106* 66* 67*  --  67* 58*  CREATININE 5.64* 4.27* 4.20*  --  4.34* 4.07*  CALCIUM 9.1 8.9 8.6 8.6* 8.7 8.6  PHOS  --   --  5.6*  --   --   --     Liver Function Tests:  Recent  Labs Lab 12/25/14 1322 12/26/14 1456 12/27/14 0515  AST 122*  --  41*  ALT 94*  --  68*  ALKPHOS 109  --  82  BILITOT 0.5  --  0.5  PROT 5.1*  --  5.0*  ALBUMIN 2.7* 2.3* 2.3*   No results for input(s): LIPASE, AMYLASE in the last 168 hours. No results for input(s): AMMONIA in the last 168 hours.  CBC:  Recent Labs Lab 12/25/14 1048 12/25/14 1108 12/26/14 1229 12/27/14 0515 12/28/14 0915  WBC 7.0  --  5.2 4.6 5.1  HGB 12.1 14.3 10.5* 10.2* 11.0*  HCT 39.1 42.0 33.9* 33.4* 34.9*  MCV 93.5  --  93.4 94.9 93.6  PLT 181  --  153 127* 98*    Cardiac Enzymes: No results for input(s): CKTOTAL, CKMB, CKMBINDEX, TROPONINI in the last 168 hours. BNP (last 3 results)  Recent Labs  12/25/14 1048  BNP 1166.9*    ProBNP (last 3 results)  Recent Labs  04/16/14 1916  PROBNP 2399.0*      CBG:  Recent Labs Lab 12/27/14 1040 12/27/14 1204 12/27/14 1655 12/27/14 2136 12/28/14 0744  GLUCAP 66* 90 191* 201* 127*    Recent Results (from the past 240 hour(s))  Urine culture     Status: None (Preliminary result)   Collection Time: 12/25/14  1:43 PM  Result Value Ref Range Status   Specimen Description URINE, RANDOM  Final   Special Requests NONE  Final   Colony Count   Final    >=100,000 COLONIES/ML Performed at [facility]    Culture   Final    Enders Performed at [facility]    Report Status PENDING  Incomplete  MRSA PCR Screening     Status: None   Collection Time: 12/25/14  6:10 PM  Result Value Ref Range Status   MRSA by PCR NEGATIVE NEGATIVE Final    Comment:        The GeneXpert MRSA Assay (FDA approved for NASAL specimens only), is one component of a comprehensive MRSA colonization surveillance program. It is not intended to diagnose MRSA infection nor to guide or monitor treatment for MRSA infections.      Studies: Ct Head Wo Contrast  12/25/2014   CLINICAL DATA:  Altered mental status and questionable  seizure  EXAM: CT HEAD WITHOUT CONTRAST  TECHNIQUE: Contiguous axial images were obtained from the base of the skull through the vertex without intravenous contrast.  COMPARISON:  None.  FINDINGS: There is age related volume loss. There is no intracranial mass, hemorrhage, extra-axial fluid collection, or midline shift. Gray-white compartments appear normal. No acute infarct apparent. There is mild basal ganglia calcification bilaterally. Bony calvarium appears intact. 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  IMPRESSION: No  intracranial mass, hemorrhage, or focal gray -white compartment lesions/acute appearing infarct. Mild basal ganglia calcification is felt to be physiologic in this age group. 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   Electronically Signed   By: Lowella Grip M.D.   On: 12/25/2014 14:42   Dg Chest Portable 1 View  12/25/2014   CLINICAL DATA:  Leg swelling and shortness of breath.  Chest pain  EXAM: PORTABLE CHEST - 1 VIEW  COMPARISON:  04/16/2014  FINDINGS: Chronic cardiomegaly, hilar enlargement, and aortic tortuosity. When accounting for technique, there is no edema, consolidation, effusion, or pneumothorax. No osseous findings to explain chest pain  IMPRESSION: Cardiomegaly without failure.   Electronically Signed   By: Jorje Guild M.D.   On: 12/25/2014 11:32   Mm Screening Breast Tomo Bilateral  12/10/2014   CLINICAL DATA:  Screening.  EXAM: DIGITAL SCREENING BILATERAL MAMMOGRAM WITH 3D TOMO WITH CAD  COMPARISON:  Previous exam(s).  ACR Breast Density Category b: There are scattered areas of fibroglandular density.  FINDINGS: There are no findings suspicious for malignancy. Images were processed with CAD.  IMPRESSION: No mammographic evidence of malignancy. A result letter of this screening mammogram will be mailed directly to the patient.  RECOMMENDATION: Screening mammogram in one year. (Code:SM-B-01Y)  BI-RADS CATEGORY  1: Negative.   Electronically Signed   By: Abelardo Diesel M.D.   On:  12/10/2014 09:43    Scheduled Meds: . allopurinol  300 mg Oral Daily  . antiseptic oral rinse  7 mL Mouth Rinse BID  . aspirin EC  81 mg Oral Daily  . atorvastatin  80 mg Oral q1800  . [START ON 12/29/2014] azithromycin  250 mg Oral Daily  . calcitRIOL  0.25 mcg Oral Q M,W,F  . calcium acetate  1,334 mg Oral TID WC  . citalopram  40 mg Oral Daily  . cloNIDine  0.3 mg Oral BID  . colchicine  0.6 mg Oral Daily  . famotidine  20 mg Oral Daily  . fluticasone  1 spray Each Nare BID  . furosemide  160 mg Oral BID  . guaiFENesin-dextromethorphan  5 mL Oral Q6H  . heparin  5,000 Units Subcutaneous 3 times per day  . hydrocerin   Topical BID  . insulin aspart  0-9 Units Subcutaneous TID WC  . levothyroxine  50 mcg Oral QAC breakfast  . loratadine  10 mg Oral Daily  . multivitamin  1 tablet Oral QHS  . sodium chloride  3 mL Intravenous Q12H   Continuous Infusions:      Time spent: 30 minutes   [facility]  [facility] Pager- [email or web address], password [facility] 12/28/2014, 12:19 PM  LOS: 3 days

## 2014-12-29 ENCOUNTER — Inpatient Hospital Stay (HOSPITAL_COMMUNITY): Payer: Medicare Other

## 2014-12-29 DIAGNOSIS — E875 Hyperkalemia: Secondary | ICD-10-CM | POA: Diagnosis not present

## 2014-12-29 LAB — RENAL FUNCTION PANEL
ANION GAP: 10 (ref 5–15)
Albumin: 2.4 g/dL — ABNORMAL LOW (ref 3.5–5.2)
BUN: 67 mg/dL — ABNORMAL HIGH (ref 6–23)
CO2: 30 mmol/L (ref 19–32)
CREATININE: 5 mg/dL — AB (ref 0.50–1.10)
Calcium: 9 mg/dL (ref 8.4–10.5)
Chloride: 97 mmol/L (ref 96–112)
GFR calc non Af Amer: 8 mL/min — ABNORMAL LOW (ref >90)
GFR, EST AFRICAN AMERICAN: 9 mL/min — AB (ref >90)
GLUCOSE: 132 mg/dL — AB (ref 70–99)
POTASSIUM: 3.6 mmol/L (ref 3.5–5.1)
Phosphorus: 5.5 mg/dL — ABNORMAL HIGH (ref 2.3–4.6)
SODIUM: 137 mmol/L (ref 135–145)

## 2014-12-29 LAB — GLUCOSE, CAPILLARY: Glucose-Capillary: 126 mg/dL — ABNORMAL HIGH (ref 70–99)

## 2014-12-29 MED ORDER — INSULIN ASPART 100 UNIT/ML ~~LOC~~ SOLN: 0.0000 [IU] | Freq: Three times a day (TID) | SUBCUTANEOUS | Status: DC

## 2014-12-29 MED ORDER — PNEUMOCOCCAL VAC POLYVALENT 25 MCG/0.5ML IJ INJ
0.5000 mL | INJECTION | INTRAMUSCULAR | Status: AC
  Administered 2014-12-29: 0.5 mL via INTRAMUSCULAR
  Filled 2014-12-29: qty 0.5

## 2014-12-29 MED ORDER — CEFTRIAXONE SODIUM IN DEXTROSE 20 MG/ML IV SOLN
1.0000 g | INTRAVENOUS | Status: DC
  Administered 2014-12-29 – 2014-12-30 (×2): 1 g via INTRAVENOUS
  Filled 2014-12-29 (×3): qty 50

## 2014-12-29 MED ORDER — INSULIN GLARGINE 100 UNIT/ML ~~LOC~~ SOLN
20.0000 [IU] | Freq: Every day | SUBCUTANEOUS | Status: DC
  Administered 2014-12-29: 20 [IU] via SUBCUTANEOUS
  Filled 2014-12-29 (×2): qty 0.2

## 2014-12-29 NOTE — Progress Notes (Addendum)
Admit: 12/25/2014 LOS: 4  1F new ESRD [name] CKA) 2/2 DM2   Subjective:  No complaints No new issues   02/07 0701 - 02/08 0700 In: 600 [P.O.:600] Out: 100 [Urine:100]  Filed Weights   12/27/14 0921 12/27/14 2141 12/28/14 2100  Weight: 90.3 kg (199 lb 1.2 oz) 90.22 kg (198 lb 14.4 oz) 90.2 kg (198 lb 13.7 oz)    Scheduled Meds: . allopurinol  300 mg Oral Daily  . antiseptic oral rinse  7 mL Mouth Rinse BID  . aspirin EC  81 mg Oral Daily  . atorvastatin  80 mg Oral q1800  . azithromycin  250 mg Oral Daily  . calcitRIOL  0.25 mcg Oral Q M,W,F  . calcium acetate  1,334 mg Oral TID WC  . citalopram  40 mg Oral Daily  . cloNIDine  0.3 mg Oral BID  . colchicine  0.6 mg Oral Daily  . famotidine  20 mg Oral Daily  . fluticasone  1 spray Each Nare BID  . furosemide  160 mg Oral BID  . guaiFENesin-dextromethorphan  5 mL Oral Q6H  . heparin  5,000 Units Subcutaneous 3 times per day  . hydrocerin   Topical BID  . insulin aspart  0-9 Units Subcutaneous TID WC  . levothyroxine  50 mcg Oral QAC breakfast  . loratadine  10 mg Oral Daily  . multivitamin  1 tablet Oral QHS  . sodium chloride  3 mL Intravenous Q12H   Continuous Infusions:  PRN Meds:.acetaminophen, albuterol, ALPRAZolam, alum & mag hydroxide-simeth, HYDROcodone-acetaminophen, ondansetron **OR** ondansetron (ZOFRAN) IV, polyethylene glycol, pregabalin, traMADol  Current Labs: reviewed    Physical Exam:  Blood pressure 117/61, pulse 77, temperature 97.8 F (36.6 C), temperature source Oral, resp. rate 18, height 5' 5.5" (1.664 m), weight 90.2 kg (198 lb 13.7 oz), SpO2 100 %. NAD RRR CTAB 2-3+ LEE, pitting No erythema LUA AVF +B/T NCAT EOMI  A/P 1. New ESRD 1. Keep on THS Schedule 2. Tx #3 12/30/14 3. CLIP 4. Patient accepted to self [facility] Tuesday Thursday Saturday first shift, could potentially go home today. 2. 2HPTH / Hyperphosphatemia 1. 12/26/14 PTH 237; Phos at goal 2. On TIW 0.93mcg  calcitriol 3. On PhosLo 2qAC 3. HTN/Vol 1. Clonidine 0.3 BID, Lasix 160 BID 2. BP stable 3. Last HD post weight 90.3kg, next post target 88.5kg 4. HD Access 5. Anemia: HB at goal, not on ESA  Pearson Grippe MD 12/29/2014, 11:53 AM   Recent Labs Lab 12/26/14 1456  12/27/14 0515 12/28/14 0915 12/29/14 0740  NA 139  --  141 137 137  K 3.9  --  3.4* 3.6 3.6  CL 101  --  104 101 97  CO2 30  --  29 27 30   GLUCOSE 208*  --  174* 206* 132*  BUN 67*  --  67* 58* 67*  CREATININE 4.20*  --  4.34* 4.07* 5.00*  CALCIUM 8.6  < > 8.7 8.6 9.0  PHOS 5.6*  --   --   --  5.5*  < > = values in this interval not displayed.  Recent Labs Lab 12/26/14 1229 12/27/14 0515 12/28/14 0915  WBC 5.2 4.6 5.1  HGB 10.5* 10.2* 11.0*  HCT 33.9* 33.4* 34.9*  MCV 93.4 94.9 93.6  PLT 153 127* 98*

## 2014-12-29 NOTE — Care Management Note (Signed)
CARE MANAGEMENT NOTE 12/29/2014  Patient:  [name], [name]   Account Number:  [id number]  Date Initiated:  12/29/2014  Documentation initiated by:  Evanee Lubrano  Subjective/Objective Assessment:   CM following for progession and d/c planning.     Action/Plan:   New start HD, awaiting CLIP process.   Anticipated DC Date:  12/31/2014   Anticipated DC Plan:  HOME/SELF CARE         Choice offered to / List presented to:             Status of service:  Completed, signed off Medicare Important Message given?  YES (If response is "NO", the following Medicare IM given date fields will be blank) Date Medicare IM given:  12/29/2014 Medicare IM given by:  Fayette Gasner Date Additional Medicare IM given:   Additional Medicare IM given by:    Discharge Disposition:  HOME/SELF CARE  Per UR Regulation:    If discussed at Long Length of Stay Meetings, dates discussed:    Comments:

## 2014-12-29 NOTE — Progress Notes (Signed)
Patient's daughter called and is concerned because she said that her mother was slurring her words over the phone. On assessment face symmetrical, arm drift absent,  slurred speech absent. Spoke with Dr. Allyson Sabal.  Per Dr. Allyson Sabal no neurological deficit noticed on exam.

## 2014-12-29 NOTE — Progress Notes (Addendum)
[facility] PROGRESS NOTE  [name] OMA:[id number] DOB: July 07, 1946 DOA: 12/25/2014 PCP: Alesia Richards, MD   HPI/Subjective: Patient continues to have fever, 100.6 last night, has a nonproductive cough   Assessment/Plan:  Hypoglycemia in DM 2 - initially required D5 infusion Initially home dose of Lantus was held - A1c 7.5 CBG increasing, therefore we'll restart Lantus at 20 units daily, cont SSI Diabetes coordinator consultation requested  Fever/ acute bronchitis Has KLEBSIELLA PNEUMONIAE UTI -start Rocephin for 5 days On Z pak for bronchitis  Continues to have fever  Will do CXR ,repeat BC .   CKD stage V now ESRD -Dialysis started on this admission - Renal consulted - perform CLIP procedure and get her arranged for outpatient dialysis  Cracked left heel - no ulcer or cellulits noted- very tender around crack- started eucerin BID    COPD.  -No acute issues. Supportive care with nebulizer treatments - oxygen as needed.   Dyslipidemia.  -Continue home dose statin.   Essential hypertension.  -Continue home dose Catapres along with diuretics. She still making urine.  History of gout.  -Continue allopurinol.   Hypothyroidism.  -Continue home dose Synthroid.   GERD.  -Continue on H2 blocker.   Code Status: full Family Communication:  Disposition Plan:  Anticipate discharge when Clipped  Brief narrative: [name] is a 69 y.o. female with past medical history significant for diabetes mellitus with nephropathy, hypertension, hyperlipidemia, anxiety as well as COPD. She also has progressive renal insufficiency likely secondary to diabetes. She is followed by Dr. Edrick Oh at [facility]. She is status post AV fistula placement she tells me 4 years ago. Patient was seen by Dr. Justin Mend in December 2015 at which time creatinine was 5.17 and BUN was 72. She was not felt to be uremic at that time. Patient states that over the last  several days she's noticed worsening lower extremity edema. She was going to the diabetic doctor this morning when she was noted to be very shaky and anxious so was diverted to the emergency department. In the emergency room she was found to be hypoglycemic. This was corrected. Then labs returned with a BUN of over 100 and a potassium level of 6.5. Creatinine is a little bit higher. Patient states that she feels better now that her sugar is better  Consultants:  Nephrology  Procedures:  Hemodialysis  Antibiotics: None    Objective: Filed Vitals:   12/28/14 2100 12/29/14 0500 12/29/14 0800 12/29/14 1059  BP: 115/58 93/57 97/47  117/61  Pulse: 81 63 72 77  Temp: 100.6 F (38.1 C) 97.7 F (36.5 C) 97.8 F (36.6 C) 97.8 F (36.6 C)  TempSrc: Oral Oral Oral Oral  Resp: 18 18 18    Height:      Weight: 90.2 kg (198 lb 13.7 oz)     SpO2: 100% 96% 100%     Intake/Output Summary (Last 24 hours) at 12/29/14 1314 Last data filed at 12/29/14 0700  Gross per 24 hour  Intake    600 ml  Output      0 ml  Net    600 ml    Exam:  General: No acute respiratory distress Lungs: Clear to auscultation bilaterally without wheezes or crackles Cardiovascular: Regular rate and rhythm without murmur gallop or rub normal S1 and S2 Abdomen: Nontender, nondistended, soft, bowel sounds positive, no rebound, no ascites, no appreciable mass Extremities: No significant cyanosis, clubbing, or edema bilateral lower extremities Skin- dry skin with cracked  left heel which is tender- no edema, cellulitis or ulcer      Data Reviewed: Basic Metabolic Panel:  Recent Labs Lab 12/26/14 1229 12/26/14 1456 12/26/14 1500 12/27/14 0515 12/28/14 0915 12/29/14 0740  NA 142 139  --  141 137 137  K 3.9 3.9  --  3.4* 3.6 3.6  CL 106 101  --  104 101 97  CO2 30 30  --  29 27 30   GLUCOSE 91 208*  --  174* 206* 132*  BUN 66* 67*  --  67* 58* 67*  CREATININE 4.27* 4.20*  --  4.34* 4.07* 5.00*  CALCIUM  8.9 8.6 8.6* 8.7 8.6 9.0  PHOS  --  5.6*  --   --   --  5.5*    Liver Function Tests:  Recent Labs Lab 12/25/14 1322 12/26/14 1456 12/27/14 0515 12/29/14 0740  AST 122*  --  41*  --   ALT 94*  --  68*  --   ALKPHOS 109  --  82  --   BILITOT 0.5  --  0.5  --   PROT 5.1*  --  5.0*  --   ALBUMIN 2.7* 2.3* 2.3* 2.4*   No results for input(s): LIPASE, AMYLASE in the last 168 hours. No results for input(s): AMMONIA in the last 168 hours.  CBC:  Recent Labs Lab 12/25/14 1048 12/25/14 1108 12/26/14 1229 12/27/14 0515 12/28/14 0915  WBC 7.0  --  5.2 4.6 5.1  HGB 12.1 14.3 10.5* 10.2* 11.0*  HCT 39.1 42.0 33.9* 33.4* 34.9*  MCV 93.5  --  93.4 94.9 93.6  PLT 181  --  153 127* 98*    Cardiac Enzymes: No results for input(s): CKTOTAL, CKMB, CKMBINDEX, TROPONINI in the last 168 hours. BNP (last 3 results)  Recent Labs  12/25/14 1048  BNP 1166.9*    ProBNP (last 3 results)  Recent Labs  04/16/14 1916  PROBNP 2399.0*      CBG:  Recent Labs Lab 12/28/14 0744 12/28/14 1243 12/28/14 1608 12/28/14 2202 12/29/14 0801  GLUCAP 127* 217* 234* 175* 126*    Recent Results (from the past 240 hour(s))  Urine culture     Status: None   Collection Time: 12/25/14  1:43 PM  Result Value Ref Range Status   Specimen Description URINE, RANDOM  Final   Special Requests NONE  Final   Colony Count   Final    >=100,000 COLONIES/ML Performed at [facility]    Culture   Final    KLEBSIELLA PNEUMONIAE Performed at [facility]    Report Status 12/28/2014 FINAL  Final   Organism ID, Bacteria KLEBSIELLA PNEUMONIAE  Final      Susceptibility   Klebsiella pneumoniae - MIC*    AMPICILLIN >=32 RESISTANT Resistant     CEFAZOLIN <=4 SENSITIVE Sensitive     CEFTRIAXONE <=1 SENSITIVE Sensitive     CIPROFLOXACIN <=0.25 SENSITIVE Sensitive     GENTAMICIN <=1 SENSITIVE Sensitive     LEVOFLOXACIN 1 SENSITIVE Sensitive     NITROFURANTOIN 256 RESISTANT Resistant      TOBRAMYCIN <=1 SENSITIVE Sensitive     TRIMETH/SULFA <=20 SENSITIVE Sensitive     PIP/TAZO 64 INTERMEDIATE Intermediate     * KLEBSIELLA PNEUMONIAE  MRSA PCR Screening     Status: None   Collection Time: 12/25/14  6:10 PM  Result Value Ref Range Status   MRSA by PCR NEGATIVE NEGATIVE Final    Comment:  The GeneXpert MRSA Assay (FDA approved for NASAL specimens only), is one component of a comprehensive MRSA colonization surveillance program. It is not intended to diagnose MRSA infection nor to guide or monitor treatment for MRSA infections.      Studies: Ct Head Wo Contrast  12/25/2014   CLINICAL DATA:  Altered mental status and questionable seizure  EXAM: CT HEAD WITHOUT CONTRAST  TECHNIQUE: Contiguous axial images were obtained from the base of the skull through the vertex without intravenous contrast.  COMPARISON:  None.  FINDINGS: There is age related volume loss. There is no intracranial mass, hemorrhage, extra-axial fluid collection, or midline shift. Gray-white compartments appear normal. No acute infarct apparent. There is mild basal ganglia calcification bilaterally. Bony calvarium appears intact. 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  IMPRESSION: No intracranial mass, hemorrhage, or focal gray -white compartment lesions/acute appearing infarct. Mild basal ganglia calcification is felt to be physiologic in this age group. 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   Electronically Signed   By: Lowella Grip M.D.   On: 12/25/2014 14:42   Dg Chest Portable 1 View  12/25/2014   CLINICAL DATA:  Leg swelling and shortness of breath.  Chest pain  EXAM: PORTABLE CHEST - 1 VIEW  COMPARISON:  04/16/2014  FINDINGS: Chronic cardiomegaly, hilar enlargement, and aortic tortuosity. When accounting for technique, there is no edema, consolidation, effusion, or pneumothorax. No osseous findings to explain chest pain  IMPRESSION: Cardiomegaly without failure.   Electronically Signed   By: Jorje Guild M.D.   On: 12/25/2014 11:32   Mm Screening Breast Tomo Bilateral  12/10/2014   CLINICAL DATA:  Screening.  EXAM: DIGITAL SCREENING BILATERAL MAMMOGRAM WITH 3D TOMO WITH CAD  COMPARISON:  Previous exam(s).  ACR Breast Density Category b: There are scattered areas of fibroglandular density.  FINDINGS: There are no findings suspicious for malignancy. Images were processed with CAD.  IMPRESSION: No mammographic evidence of malignancy. A result letter of this screening mammogram will be mailed directly to the patient.  RECOMMENDATION: Screening mammogram in one year. (Code:SM-B-01Y)  BI-RADS CATEGORY  1: Negative.   Electronically Signed   By: Abelardo Diesel M.D.   On: 12/10/2014 09:43    Scheduled Meds: . allopurinol  300 mg Oral Daily  . antiseptic oral rinse  7 mL Mouth Rinse BID  . aspirin EC  81 mg Oral Daily  . atorvastatin  80 mg Oral q1800  . azithromycin  250 mg Oral Daily  . calcitRIOL  0.25 mcg Oral Q M,W,F  . calcium acetate  1,334 mg Oral TID WC  . citalopram  40 mg Oral Daily  . cloNIDine  0.3 mg Oral BID  . colchicine  0.6 mg Oral Daily  . famotidine  20 mg Oral Daily  . fluticasone  1 spray Each Nare BID  . furosemide  160 mg Oral BID  . guaiFENesin-dextromethorphan  5 mL Oral Q6H  . heparin  5,000 Units Subcutaneous 3 times per day  . hydrocerin   Topical BID  . insulin aspart  0-9 Units Subcutaneous TID WC  . levothyroxine  50 mcg Oral QAC breakfast  . loratadine  10 mg Oral Daily  . multivitamin  1 tablet Oral QHS  . sodium chloride  3 mL Intravenous Q12H   Continuous Infusions:      Time spent: 30 minutes   Josian Lanese  [facility] Pager- [email or web address], password [facility] 12/29/2014, 1:14 PM  LOS: 4 days

## 2014-12-30 ENCOUNTER — Inpatient Hospital Stay (HOSPITAL_COMMUNITY): Payer: Medicare Other

## 2014-12-30 DIAGNOSIS — R0600 Dyspnea, unspecified: Secondary | ICD-10-CM

## 2014-12-30 LAB — GLUCOSE, CAPILLARY
GLUCOSE-CAPILLARY: 254 mg/dL — AB (ref 70–99)
GLUCOSE-CAPILLARY: 232 mg/dL — AB (ref 70–99)
GLUCOSE-CAPILLARY: 161 mg/dL — AB (ref 70–99)
Glucose-Capillary: 95 mg/dL (ref 70–99)

## 2014-12-30 LAB — RENAL FUNCTION PANEL
ALBUMIN: 2.3 g/dL — AB (ref 3.5–5.2)
ANION GAP: 12 (ref 5–15)
BUN: 79 mg/dL — ABNORMAL HIGH (ref 6–23)
CALCIUM: 9.1 mg/dL (ref 8.4–10.5)
CO2: 26 mmol/L (ref 19–32)
Chloride: 96 mmol/L (ref 96–112)
Creatinine, Ser: 5.24 mg/dL — ABNORMAL HIGH (ref 0.50–1.10)
GFR, EST AFRICAN AMERICAN: 9 mL/min — AB (ref >90)
GFR, EST NON AFRICAN AMERICAN: 8 mL/min — AB (ref >90)
Glucose, Bld: 206 mg/dL — ABNORMAL HIGH (ref 70–99)
POTASSIUM: 3.6 mmol/L (ref 3.5–5.1)
Phosphorus: 5.5 mg/dL — ABNORMAL HIGH (ref 2.3–4.6)
SODIUM: 134 mmol/L — AB (ref 135–145)

## 2014-12-30 LAB — CBC
HCT: 32 % — ABNORMAL LOW (ref 36.0–46.0)
Hemoglobin: 10.3 g/dL — ABNORMAL LOW (ref 12.0–15.0)
MCH: 29.4 pg (ref 26.0–34.0)
MCHC: 32.2 g/dL (ref 30.0–36.0)
MCV: 91.4 fL (ref 78.0–100.0)
Platelets: 80 10*3/uL — ABNORMAL LOW (ref 150–400)
RBC: 3.5 MIL/uL — ABNORMAL LOW (ref 3.87–5.11)
RDW: 17.8 % — ABNORMAL HIGH (ref 11.5–15.5)
WBC: 4.6 10*3/uL (ref 4.0–10.5)

## 2014-12-30 MED ORDER — LIVING WELL WITH DIABETES BOOK
Freq: Once | Status: AC
  Administered 2014-12-30: 19:00:00
  Filled 2014-12-30: qty 1

## 2014-12-30 MED ORDER — POLYETHYLENE GLYCOL 3350 17 G PO PACK: 17.0000 g | PACK | Freq: Every day | ORAL | Status: AC | PRN

## 2014-12-30 MED ORDER — TRAMADOL HCL 50 MG PO TABS: 50.0000 mg | ORAL_TABLET | Freq: Two times a day (BID) | ORAL | Status: AC | PRN

## 2014-12-30 MED ORDER — TECHNETIUM TC 99M DIETHYLENETRIAME-PENTAACETIC ACID: 40.0000 | Freq: Once | INTRAVENOUS | Status: AC | PRN

## 2014-12-30 MED ORDER — FUROSEMIDE 80 MG PO TABS: 80.0000 mg | ORAL_TABLET | Freq: Two times a day (BID) | ORAL | Status: AC

## 2014-12-30 MED ORDER — BISACODYL 5 MG PO TBEC
5.0000 mg | DELAYED_RELEASE_TABLET | Freq: Once | ORAL | Status: AC
  Administered 2014-12-30: 5 mg via ORAL
  Filled 2014-12-30: qty 1

## 2014-12-30 MED ORDER — HEPARIN SODIUM (PORCINE) 1000 UNIT/ML DIALYSIS
20.0000 [IU]/kg | INTRAMUSCULAR | Status: DC | PRN
  Filled 2014-12-30: qty 2

## 2014-12-30 MED ORDER — GUAIFENESIN-DM 100-10 MG/5ML PO SYRP: 5.0000 mL | ORAL_SOLUTION | Freq: Four times a day (QID) | ORAL | Status: AC

## 2014-12-30 MED ORDER — INSULIN GLARGINE 100 UNIT/ML SOLOSTAR PEN: 25.0000 [IU] | PEN_INJECTOR | Freq: Every day | SUBCUTANEOUS | Status: DC

## 2014-12-30 MED ORDER — ALUM & MAG HYDROXIDE-SIMETH 200-200-20 MG/5ML PO SUSP
15.0000 mL | Freq: Once | ORAL | Status: AC
  Administered 2014-12-30: 15 mL via ORAL
  Filled 2014-12-30: qty 30

## 2014-12-30 MED ORDER — INSULIN ASPART 100 UNIT/ML ~~LOC~~ SOLN
2.0000 [IU] | Freq: Three times a day (TID) | SUBCUTANEOUS | Status: DC
  Administered 2014-12-30 – 2014-12-31 (×3): 2 [IU] via SUBCUTANEOUS

## 2014-12-30 MED ORDER — INSULIN GLARGINE 100 UNIT/ML ~~LOC~~ SOLN
25.0000 [IU] | Freq: Every day | SUBCUTANEOUS | Status: DC
  Administered 2014-12-31: 25 [IU] via SUBCUTANEOUS
  Filled 2014-12-30: qty 0.25

## 2014-12-30 MED ORDER — TECHNETIUM TO 99M ALBUMIN AGGREGATED
6.0000 | Freq: Once | INTRAVENOUS | Status: AC | PRN
  Administered 2014-12-30: 6 via INTRAVENOUS

## 2014-12-30 NOTE — Progress Notes (Signed)
Bilateral lower extremity venous duplex completed:  No evidence of DVT, superficial thrombosis, or Baker's cyst.   

## 2014-12-30 NOTE — Procedures (Signed)
I was present at this dialysis session. I have reviewed the session itself and made appropriate changes.   Tx #3.  Successful cannluation of AVF w/ 17g needles. Has chair 01/01/15 at [facility] at [place].  Goal UF 3L.   Pearson Grippe  MD 12/30/2014, 11:12 AM

## 2014-12-30 NOTE — Progress Notes (Signed)
Inpatient Diabetes Program Recommendations  AACE/ADA: New Consensus Statement on Inpatient Glycemic Control (2013)  Target Ranges:  Prepandial:   less than 140 mg/dL      Peak postprandial:   less than 180 mg/dL (1-2 hours)      Critically ill patients:  140 - 180 mg/dL     Diabetes history: DM 2 Outpatient Diabetes medications: Lantus 62 units QAM, 42 units QPM, Humalog 40 units BID Current orders for Inpatient glycemic control: Lantus 20 units Daily, Novolog 0-9 TID with meals  Inpatient Diabetes Program Recommendations Insulin - Meal Coverage: Please consider adding Novolog 3 units TID with meals for meal coverage in addition to correction scale if patient is eating at least 50% of meals.   Consult Note: Spoke with patient about diabetes and home regimen for diabetes control. Patient reports that she is followed by her PCP for diabetes management and currently she takes Lantus 62 units QAM, 42 units QPM, Humalog 40 units BID as an outpatient for diabetes control.  Her PCP just recently referred her to Dr. Loanne Drilling (Endocrinologist). Pt stated she has an appointment Feb 11th to see Dr. Loanne Drilling, However she will probably reschedule.  Inquired about knowledge about A1C and patient reports that she does not know what an A1C is. Discussed A1C results (7.5% on 12/25/14) and explained what an A1C is, basic pathophysiology of DM Type 2, basic home care, importance of checking CBGs and maintaining good CBG control to prevent long-term and short-term complications. Pt states she checks her blood glucose maybe 3 times a week. Discussed impact of nutrition, exercise, stress, sickness, and medications on diabetes control.  Patient states that her daughter is a Automotive engineer and has discussed nutrition with her. Discussed carbohydrates, carbohydrate goals per day and meal, along with portion sizes. Ordered Living Well with Diabetes book. Patient verbalized understanding of information discussed and she states that  she has no further questions at this time related to diabetes.   Thanks,  Tama Headings RN, MSN, [facility] Inpatient Diabetes Coordinator Team Pager [phone]

## 2014-12-30 NOTE — Progress Notes (Addendum)
[facility] PROGRESS NOTE  [name] TGG:[id number] DOB: [birth year] DOA: 12/25/2014 PCP: Alesia Richards, MD   HPI/Subjective: Afebrile overnight, seen in hemodialysis  Assessment/Plan:  Hypoglycemia now hyperglycemia in DM 2 - initially required D5 infusion Hemoglobin A1c 7.5 CBG increasing, increase Lantus to 25 units daily, cont SSI Diabetes coordinator consultation requested NovoLog 2 units 3 times a day  Fever/ acute bronchitis Has KLEBSIELLA PNEUMONIAE UTI -continue Rocephin day #2, On Z pak for bronchitis  Will switch to Levaquin at discharge Repeat blood culture no growth so far  Thrombocytopenia HIT panel ordered Avoid heparin products Venous duplex to rule out DVT/VQ scan to rule out PE as the patient is hypoxic 221>> 80  CKD stage V now ESRD -Dialysis started on this admission Has chair 01/01/15 at [facility] at [place] left heel - no ulcer or cellulits noted- very tender around crack- started eucerin BID  UTI? Continue Rocephin  COPD.  -No acute issues. Supportive care with nebulizer treatments - oxygen as needed.   Dyslipidemia.  -Continue home dose statin.   Essential hypertension.  -Continue home dose Catapres along with diuretics. She still making urine.  History of gout.  -Continue allopurinol.   Hypothyroidism.  -Continue home dose Synthroid.   GERD.  -Continue on H2 blocker.   Code Status: full Family Communication:  Disposition Plan:  Anticipate discharge tomorrow if platelet count is stable and workup is negative   Brief narrative: [name] is a 69 y.o. female with past medical history significant for diabetes mellitus with nephropathy, hypertension, hyperlipidemia, anxiety as well as COPD. She also has progressive renal insufficiency likely secondary to diabetes. She is followed by Dr. Edrick Oh at [facility]. She is status post AV fistula placement she tells me 4 years  ago. Patient was seen by Dr. Justin Mend in December 2015 at which time creatinine was 5.17 and BUN was 72. She was not felt to be uremic at that time. Patient states that over the last several days she's noticed worsening lower extremity edema. She was going to the diabetic doctor this morning when she was noted to be very shaky and anxious so was diverted to the emergency department. In the emergency room she was found to be hypoglycemic. This was corrected. Then labs returned with a BUN of over 100 and a potassium level of 6.5. Creatinine is a little bit higher. Patient states that she feels better now that her sugar is better  Consultants:  Nephrology  Procedures:  Hemodialysis  Antibiotics: None    Objective: Filed Vitals:   12/30/14 1200 12/30/14 1230 12/30/14 1300 12/30/14 1330  BP: 96/53 110/56 116/67 110/67  Pulse: 68 62 81 75  Temp:      TempSrc:      Resp: 27 24 18 13   Height:      Weight:      SpO2:        Intake/Output Summary (Last 24 hours) at 12/30/14 1402 Last data filed at 12/30/14 0500  Gross per 24 hour  Intake    150 ml  Output    600 ml  Net   -450 ml    Exam:  General: No acute respiratory distress Lungs: Clear to auscultation bilaterally without wheezes or crackles Cardiovascular: Regular rate and rhythm without murmur gallop or rub normal S1 and S2 Abdomen: Nontender, nondistended, soft, bowel sounds positive, no rebound, no ascites, no appreciable mass Extremities: No significant cyanosis, clubbing, or edema bilateral lower extremities  Skin- dry skin with cracked left heel which is tender- no edema, cellulitis or ulcer      Data Reviewed: Basic Metabolic Panel:  Recent Labs Lab 12/26/14 1456 12/26/14 1500 12/27/14 0515 12/28/14 0915 12/29/14 0740 12/30/14 1100  NA 139  --  141 137 137 134*  K 3.9  --  3.4* 3.6 3.6 3.6  CL 101  --  104 101 97 96  CO2 30  --  29 27 30 26   GLUCOSE 208*  --  174* 206* 132* 206*  BUN 67*  --  67* 58* 67*  79*  CREATININE 4.20*  --  4.34* 4.07* 5.00* 5.24*  CALCIUM 8.6 8.6* 8.7 8.6 9.0 9.1  PHOS 5.6*  --   --   --  5.5* 5.5*    Liver Function Tests:  Recent Labs Lab 12/25/14 1322 12/26/14 1456 12/27/14 0515 12/29/14 0740 12/30/14 1100  AST 122*  --  41*  --   --   ALT 94*  --  68*  --   --   ALKPHOS 109  --  82  --   --   BILITOT 0.5  --  0.5  --   --   PROT 5.1*  --  5.0*  --   --   ALBUMIN 2.7* 2.3* 2.3* 2.4* 2.3*   No results for input(s): LIPASE, AMYLASE in the last 168 hours. No results for input(s): AMMONIA in the last 168 hours.  CBC:  Recent Labs Lab 12/25/14 1048 12/25/14 1108 12/26/14 1229 12/27/14 0515 12/28/14 0915 12/30/14 1100  WBC 7.0  --  5.2 4.6 5.1 4.6  HGB 12.1 14.3 10.5* 10.2* 11.0* 10.3*  HCT 39.1 42.0 33.9* 33.4* 34.9* 32.0*  MCV 93.5  --  93.4 94.9 93.6 91.4  PLT 181  --  153 127* 98* 80*    Cardiac Enzymes: No results for input(s): CKTOTAL, CKMB, CKMBINDEX, TROPONINI in the last 168 hours. BNP (last 3 results)  Recent Labs  12/25/14 1048  BNP 1166.9*    ProBNP (last 3 results)  Recent Labs  04/16/14 1916  PROBNP 2399.0*      CBG:  Recent Labs Lab 12/28/14 0744 12/28/14 1243 12/28/14 1608 12/28/14 2202 12/29/14 0801  GLUCAP 127* 217* 234* 175* 126*    Recent Results (from the past 240 hour(s))  Urine culture     Status: None   Collection Time: 12/25/14  1:43 PM  Result Value Ref Range Status   Specimen Description URINE, RANDOM  Final   Special Requests NONE  Final   Colony Count   Final    >=100,000 COLONIES/ML Performed at [place]   Final    KLEBSIELLA PNEUMONIAE Performed at [facility]    Report Status 12/28/2014 FINAL  Final   Organism ID, Bacteria KLEBSIELLA PNEUMONIAE  Final      Susceptibility   Klebsiella pneumoniae - MIC*    AMPICILLIN >=32 RESISTANT Resistant     CEFAZOLIN <=4 SENSITIVE Sensitive     CEFTRIAXONE <=1 SENSITIVE Sensitive     CIPROFLOXACIN  <=0.25 SENSITIVE Sensitive     GENTAMICIN <=1 SENSITIVE Sensitive     LEVOFLOXACIN 1 SENSITIVE Sensitive     NITROFURANTOIN 256 RESISTANT Resistant     TOBRAMYCIN <=1 SENSITIVE Sensitive     TRIMETH/SULFA <=20 SENSITIVE Sensitive     PIP/TAZO 64 INTERMEDIATE Intermediate     * KLEBSIELLA PNEUMONIAE  MRSA PCR Screening     Status: None   Collection Time:  12/25/14  6:10 PM  Result Value Ref Range Status   MRSA by PCR NEGATIVE NEGATIVE Final    Comment:        The GeneXpert MRSA Assay (FDA approved for NASAL specimens only), is one component of a comprehensive MRSA colonization surveillance program. It is not intended to diagnose MRSA infection nor to guide or monitor treatment for MRSA infections.   Culture, blood (routine x 2)     Status: None (Preliminary result)   Collection Time: 12/29/14  1:50 PM  Result Value Ref Range Status   Specimen Description BLOOD RIGHT HAND  Final   Special Requests BOTTLES DRAWN AEROBIC ONLY 1CC  Final   Culture   Final           BLOOD CULTURE RECEIVED NO GROWTH TO DATE CULTURE WILL BE HELD FOR 5 DAYS BEFORE ISSUING A FINAL NEGATIVE REPORT Performed at [facility]    Report Status PENDING  Incomplete  Culture, blood (routine x 2)     Status: None (Preliminary result)   Collection Time: 12/29/14  2:05 PM  Result Value Ref Range Status   Specimen Description BLOOD RIGHT HAND  Final   Special Requests BOTTLES DRAWN AEROBIC ONLY 1CC  Final   Culture   Final           BLOOD CULTURE RECEIVED NO GROWTH TO DATE CULTURE WILL BE HELD FOR 5 DAYS BEFORE ISSUING A FINAL NEGATIVE REPORT Performed at [facility]    Report Status PENDING  Incomplete     Studies: Dg Chest 2 View  12/29/2014   CLINICAL DATA:  Fever, shortness of breath, cough and congestion. History of diabetes, congestive heart failure, COPD and hypertension. Initial encounter.  EXAM: CHEST  2 VIEW  COMPARISON:  12/25/2014 and 04/16/2014.  FINDINGS: Stable cardiomegaly and  chronic vascular congestion. There is no overt pulmonary edema or confluent airspace opacity. There is no pleural effusion or pneumothorax. The bones appear unchanged.  IMPRESSION: Stable cardiomegaly and chronic vascular congestion.   Electronically Signed   By: Camie Patience M.D.   On: 12/29/2014 14:43   Ct Head Wo Contrast  12/25/2014   CLINICAL DATA:  Altered mental status and questionable seizure  EXAM: CT HEAD WITHOUT CONTRAST  TECHNIQUE: Contiguous axial images were obtained from the base of the skull through the vertex without intravenous contrast.  COMPARISON:  None.  FINDINGS: There is age related volume loss. There is no intracranial mass, hemorrhage, extra-axial fluid collection, or midline shift. Gray-white compartments appear normal. No acute infarct apparent. There is mild basal ganglia calcification bilaterally. Bony calvarium appears intact. 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  IMPRESSION: No intracranial mass, hemorrhage, or focal gray -white compartment lesions/acute appearing infarct. Mild basal ganglia calcification is felt to be physiologic in this age group. 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   Electronically Signed   By: Lowella Grip M.D.   On: 12/25/2014 14:42   Dg Chest Portable 1 View  12/25/2014   CLINICAL DATA:  Leg swelling and shortness of breath.  Chest pain  EXAM: PORTABLE CHEST - 1 VIEW  COMPARISON:  04/16/2014  FINDINGS: Chronic cardiomegaly, hilar enlargement, and aortic tortuosity. When accounting for technique, there is no edema, consolidation, effusion, or pneumothorax. No osseous findings to explain chest pain  IMPRESSION: Cardiomegaly without failure.   Electronically Signed   By: Jorje Guild M.D.   On: 12/25/2014 11:32   Mm Screening Breast Tomo Bilateral  12/10/2014   CLINICAL DATA:  Screening.  EXAM: DIGITAL  SCREENING BILATERAL MAMMOGRAM WITH 3D TOMO WITH CAD  COMPARISON:  Previous exam(s).  ACR Breast Density Category b: There are scattered areas of  fibroglandular density.  FINDINGS: There are no findings suspicious for malignancy. Images were processed with CAD.  IMPRESSION: No mammographic evidence of malignancy. A result letter of this screening mammogram will be mailed directly to the patient.  RECOMMENDATION: Screening mammogram in one year. (Code:SM-B-01Y)  BI-RADS CATEGORY  1: Negative.   Electronically Signed   By: Abelardo Diesel M.D.   On: 12/10/2014 09:43    Scheduled Meds: . allopurinol  300 mg Oral Daily  . antiseptic oral rinse  7 mL Mouth Rinse BID  . aspirin EC  81 mg Oral Daily  . atorvastatin  80 mg Oral q1800  . azithromycin  250 mg Oral Daily  . calcitRIOL  0.25 mcg Oral Q M,W,F  . calcium acetate  1,334 mg Oral TID WC  . cefTRIAXone (ROCEPHIN)  IV  1 g Intravenous Q24H  . citalopram  40 mg Oral Daily  . cloNIDine  0.3 mg Oral BID  . colchicine  0.6 mg Oral Daily  . famotidine  20 mg Oral Daily  . fluticasone  1 spray Each Nare BID  . furosemide  160 mg Oral BID  . guaiFENesin-dextromethorphan  5 mL Oral Q6H  . hydrocerin   Topical BID  . insulin aspart  0-9 Units Subcutaneous TID WC  . insulin glargine  20 Units Subcutaneous Daily  . levothyroxine  50 mcg Oral QAC breakfast  . living well with diabetes book   Does not apply Once  . loratadine  10 mg Oral Daily  . multivitamin  1 tablet Oral QHS  . sodium chloride  3 mL Intravenous Q12H   Continuous Infusions:      Time spent: 30 minutes   [name]  [facility] Pager- [email or web address], password [facility] 12/30/2014, 2:02 PM  LOS: 5 days

## 2014-12-30 NOTE — Clinical Social Work Note (Signed)
CSW-Intern spoke with patient at bedside concerning transportation needs. CSW-Intern provided resources for medical transportation and SCAT application. Patient explained her daughter will be by this evening to assist her with the application.  CSW spoke with patient's daughter, Lyn Henri ([phone]) by phone explaining the resources that were given to her mother and overview of SCAT application. Ms.Coates will assist her in completing the application and turn it into the SCAT office today.    Gwenevere Abbot CSW-Intern Clinical Social Work

## 2014-12-30 NOTE — Progress Notes (Signed)
12/30/2014 10:41 AM Hemodialysis Outpatient Note; this patient has been accepted at the [facility] on a Tuesday, Thursday and Saturday 2nd shift schedule. The center can begin treatment on Thursday February 11th at 10.:00. Thank you. Gordy Savers

## 2014-12-31 ENCOUNTER — Other Ambulatory Visit: Payer: Self-pay | Admitting: Internal Medicine

## 2014-12-31 DIAGNOSIS — E1021 Type 1 diabetes mellitus with diabetic nephropathy: Secondary | ICD-10-CM

## 2014-12-31 LAB — GLUCOSE, CAPILLARY
GLUCOSE-CAPILLARY: 256 mg/dL — AB (ref 70–99)
GLUCOSE-CAPILLARY: 173 mg/dL — AB (ref 70–99)
Glucose-Capillary: 127 mg/dL — ABNORMAL HIGH (ref 70–99)
Glucose-Capillary: 175 mg/dL — ABNORMAL HIGH (ref 70–99)

## 2014-12-31 LAB — CBC
HCT: 32.2 % — ABNORMAL LOW (ref 36.0–46.0)
Hemoglobin: 9.9 g/dL — ABNORMAL LOW (ref 12.0–15.0)
MCH: 28.8 pg (ref 26.0–34.0)
MCHC: 30.7 g/dL (ref 30.0–36.0)
MCV: 93.6 fL (ref 78.0–100.0)
PLATELETS: 103 10*3/uL — AB (ref 150–400)
RBC: 3.44 MIL/uL — ABNORMAL LOW (ref 3.87–5.11)
RDW: 17.9 % — AB (ref 11.5–15.5)
WBC: 4.4 10*3/uL (ref 4.0–10.5)

## 2014-12-31 LAB — HEPARIN INDUCED THROMBOCYTOPENIA PNL: HEPARIN INDUCED PLT AB: 0.227 {OD_unit} (ref 0.000–0.400)

## 2014-12-31 MED ORDER — ATORVASTATIN CALCIUM 80 MG PO TABS: 80.0000 mg | ORAL_TABLET | Freq: Every day | ORAL | Status: AC

## 2014-12-31 MED ORDER — INSULIN GLARGINE 100 UNIT/ML ~~LOC~~ SOLN: 25.0000 [IU] | Freq: Every day | SUBCUTANEOUS | Status: DC

## 2014-12-31 MED ORDER — INSULIN GLARGINE 100 UNIT/ML ~~LOC~~ SOLN: 25.0000 [IU] | Freq: Every day | SUBCUTANEOUS | Status: AC

## 2014-12-31 MED ORDER — CIPROFLOXACIN HCL 500 MG PO TABS: 500.0000 mg | ORAL_TABLET | Freq: Every day | ORAL | Status: AC

## 2014-12-31 NOTE — Progress Notes (Signed)
Discharge instructions and medications discussed with patient.  Prescriptions given to patient.  All questions answered.  

## 2014-12-31 NOTE — Care Management Note (Addendum)
CARE MANAGEMENT NOTE 12/31/2014  Patient:  [name], [name]   Account Number:  [id number]  Date Initiated:  12/29/2014  Documentation initiated by:  Rustin Erhart  Subjective/Objective Assessment:   CM following for progession and d/c planning.     Action/Plan:   New start HD, awaiting CLIP process.  12/31/2014 Plan for d/c today, HHPT ordered, cane per PT eval and recommendation. Pt selected [facility] as they had provided services for her husband previously.   Anticipated DC Date:  12/31/2014   Anticipated DC Plan:  [place]         [facility] Choice  HOME HEALTH   Choice offered to / List presented to:  C-1 Patient   DME arranged  [facility]      DME agency  [place] arranged  HH-2 PT    [facility]livia Lopez de Gutierrez OT   [place] aide  [place].   Status of service:  Completed, signed off Medicare Important Message given?  YES (If response is "NO", the following Medicare IM given date fields will be blank) Date Medicare IM given:  12/29/2014 Medicare IM given by:  Tayvin Preslar Date Additional Medicare IM given:  12/31/2014 Additional Medicare IM given by:  [facility]  Discharge Disposition:  [place]  Per UR Regulation:    If discussed at Long Length of Stay Meetings, dates discussed:    Comments:

## 2014-12-31 NOTE — Evaluation (Signed)
Physical Therapy Evaluation Patient Details Name: [name] MRN: [id number] DOB: 1946-03-16 Today's Date: 12/31/2014   History of Present Illness  Pt is a 69 y.o. female, with history of chronic kidney disease stage V now close to ESRD, dyslipidemia, hypertension, hyperparathyroidism, diabetes but is type 2 insulin-dependent, essential hypertension, brought into the hospital with gradually progressive leg swelling, shortness of breath, in the ER she was found to be confused and altered, her CBG was 20, apparently patient was not feeling well and taking care of her husband and forgot to eat breakfast and lunch despite taking her Lantus last night.  Clinical Impression  Pt admitted with above diagnosis. Pt currently with functional limitations due to the deficits listed below (see PT Problem List). At the time of PT eval pt was able to perform transfers and ambulation with steadying assist only. Pt reports that she was using a cane PTA on the days she feels "wobbly", and mostly feels back to baseline. Pt will benefit from skilled PT to increase their independence and safety with mobility to allow discharge to the venue listed below. Will continue to follow acutely to improve tolerance for functional activity and balance. Feel that pt will benefit from a [facility] at home, as well as follow-up PT/OT services.      Follow Up Recommendations Home health PT;Supervision/Assistance - 24 hour    Equipment Recommendations  Cane    Recommendations for Other Services       Precautions / Restrictions Precautions Precautions: Fall Restrictions Weight Bearing Restrictions: No      Mobility  Bed Mobility Overal bed mobility: Needs Assistance Bed Mobility: Supine to Sit     Supine to sit: Supervision     General bed mobility comments: Supervision for safety - increased time and use of bed rails for support. Pt appears somewhat wobbly when first in sitting.   Transfers Overall transfer level:  Needs assistance Equipment used: None Transfers: Sit to/from Stand Sit to Stand: Min guard         General transfer comment: Pt able to power-up to full standing with no physical assist. Slightly unsteady however no LOB.   Ambulation/Gait Ambulation/Gait assistance: Min assist Ambulation Distance (Feet): 300 Feet Assistive device: None Gait Pattern/deviations: Step-through pattern;Decreased stride length;Trunk flexed Gait velocity: Decreased Gait velocity interpretation: Below normal speed for age/gender General Gait Details: Pt was able to ambulate a good distance with occasional steadying assist for balance. Pt states that she has been using her husband's cane however no longer has it at home. Pt ambulated on RA and sats dropped to 85%. Pt asymptomatic.   Stairs            Wheelchair Mobility    Modified Rankin (Stroke Patients Only)       Balance                                             Pertinent Vitals/Pain Pain Assessment: No/denies pain    Home Living Family/patient expects to be discharged to:: Private residence Living Arrangements: Alone Available Help at Discharge: Family;Available PRN/intermittently Type of Home: House Home Access: Stairs to enter   Entrance Stairs-Number of Steps: 2 Home Layout: Two level;1/2 bath on main level Home Equipment: Bedside commode Additional Comments: Pt has been using her husband's cane occasionally, however he is now in a nursing home and took his cane with him.  Prior Function Level of Independence: Independent with assistive device(s)         Comments: Does not drive     Hand Dominance   Dominant Hand: Right    Extremity/Trunk Assessment   Upper Extremity Assessment: Defer to OT evaluation (Had difficulty donning socks)           Lower Extremity Assessment: LLE deficits/detail   LLE Deficits / Details: Sore "spot" on her "heel" which appears to encompass her calcaneal area  up through her Achilles tendon area. Some weakness noted compared to the R side, however unsure if this is because of pain or actual muscle strength deficit.   Cervical / Trunk Assessment: Normal  Communication   Communication: No difficulties  Cognition Arousal/Alertness: Awake/alert Behavior During Therapy: WFL for tasks assessed/performed                        General Comments      Exercises        Assessment/Plan    PT Assessment Patient needs continued PT services  PT Diagnosis Difficulty walking   PT Problem List Decreased strength;Decreased range of motion;Decreased activity tolerance;Decreased balance;Decreased mobility;Decreased knowledge of use of DME;Decreased safety awareness;Decreased knowledge of precautions;Cardiopulmonary status limiting activity  PT Treatment Interventions DME instruction;Gait training;Stair training;Functional mobility training;Therapeutic exercise;Therapeutic activities;Neuromuscular re-education;Patient/family education   PT Goals (Current goals can be found in the Care Plan section) Acute Rehab PT Goals Patient Stated Goal: To return home PT Goal Formulation: With patient Time For Goal Achievement: 01/07/15 Potential to Achieve Goals: Good    Frequency Min 3X/week   Barriers to discharge Decreased caregiver support Pt lives alone    Co-evaluation               End of Session Equipment Utilized During Treatment: Gait belt Activity Tolerance: Patient tolerated treatment well Patient left: Other (comment);with call bell/phone within reach (Sitting EOB eating her breakfast) Nurse Communication: Mobility status;Other (comment) (O2 status)         Time: 0211-1735 PT Time Calculation (min) (ACUTE ONLY): 21 min   Charges:   PT Evaluation $Initial PT Evaluation Tier I: 1 Procedure     PT G Codes:        [name] 23-Jan-2015, 10:23 AM   [name], PT, DPT Acute Rehabilitation Services Pager:  [phone]

## 2014-12-31 NOTE — Discharge Summary (Addendum)
Physician Discharge Summary  [name] MRN: [id number] DOB/AGE: 23-Mar-1946 69 y.o.  PCP: Alesia Richards, MD   Admit date: 12/25/2014 Discharge date: 12/31/2014  Discharge Diagnoses:  Klebsiella UTI   Hypoglycemia Active Problems:   Essential hypertension   Chronic diastolic heart failure   COPD (chronic obstructive pulmonary disease)   Hyperparathyroidism   Hyperkalemia   ESRD (end stage renal disease)   Acute bronchiolitis due to other infectious organisms  Follow-up recommendations Follow-up with PCP in 5-7 days Successful cannluation of AVF w/ 17g needles. Has chair 01/01/15 at [facility] at [facility].   Goal UF 3L.  Follow results of the HIT panel   before using heparin products      Medication List    STOP taking these medications        acetaminophen 500 MG tablet  Commonly known as:  TYLENOL     doxycycline 100 MG tablet  Commonly known as:  VIBRA-TABS     HYDROcodone-homatropine 5-1.5 MG/5ML syrup  Commonly known as:  HYCODAN     Insulin Glargine 100 UNIT/ML Solostar Pen  Commonly known as:  LANTUS  Replaced by:  insulin glargine 100 UNIT/ML injection     insulin lispro 100 UNIT/ML injection  Commonly known as:  HUMALOG     metolazone 5 MG tablet  Commonly known as:  ZAROXOLYN     potassium chloride SA 20 MEQ tablet  Commonly known as:  K-DUR,KLOR-CON      TAKE these medications        albuterol 108 (90 BASE) MCG/ACT inhaler  Commonly known as:  PROVENTIL HFA;VENTOLIN HFA  Inhale 1-2 puffs into the lungs every 6 (six) hours as needed for wheezing or shortness of breath.     allopurinol 300 MG tablet  Commonly known as:  ZYLOPRIM  take 1 tablet by mouth once daily     ALPRAZolam 0.5 MG tablet  Commonly known as:  XANAX  Take 1 tablet (0.5 mg total) by mouth at bedtime as needed for anxiety or sleep.     aspirin 81 MG tablet  Take 1 tablet (81 mg total) by mouth daily.     atorvastatin 40 MG tablet  Commonly  known as:  LIPITOR  Take 40 mg by mouth daily.     atorvastatin 80 MG tablet  Commonly known as:  LIPITOR  take 1 tablet by mouth once daily     B-D ULTRAFINE III SHORT PEN 31G X 8 MM Misc  Generic drug:  Insulin Pen Needle  use three times a day     calcitRIOL 0.25 MCG capsule  Commonly known as:  ROCALTROL  Take 0.25 mcg by mouth See admin instructions. M,W,F     calcium acetate 667 MG capsule  Commonly known as:  PHOSLO  Take 1,334 mg by mouth 3 (three) times daily with meals.     cetirizine 10 MG tablet  Commonly known as:  ZYRTEC  Take 10 mg by mouth daily.     Cholecalciferol 1000 UNITS tablet  Take 4,000 Units by mouth daily.     ciprofloxacin 500 MG tablet  Commonly known as:  CIPRO  Take 1 tablet (500 mg total) by mouth daily with breakfast.     citalopram 40 MG tablet  Commonly known as:  CELEXA  take 1 tablet by mouth once daily     cloNIDine 0.3 MG tablet  Commonly known as:  CATAPRES  Take 0.3 mg by mouth 2 (two) times daily.  colchicine 0.6 MG tablet  Take 1 tablet (0.6 mg total) by mouth daily.     fluticasone 50 MCG/ACT nasal spray  Commonly known as:  FLONASE  Place 1 spray into both nostrils 2 (two) times daily.     furosemide 80 MG tablet  Commonly known as:  LASIX  Take 1 tablet (80 mg total) by mouth 2 (two) times daily.     guaiFENesin-dextromethorphan 100-10 MG/5ML syrup  Commonly known as:  ROBITUSSIN DM  Take 5 mLs by mouth every 6 (six) hours.     insulin glargine 100 UNIT/ML injection  Commonly known as:  LANTUS  Inject 0.25 mLs (25 Units total) into the skin daily.     levothyroxine 50 MCG tablet  Commonly known as:  SYNTHROID, LEVOTHROID  Take 50 mcg by mouth daily before breakfast.     polyethylene glycol packet  Commonly known as:  MIRALAX / GLYCOLAX  Take 17 g by mouth daily as needed for mild constipation.     pregabalin 50 MG capsule  Commonly known as:  LYRICA  Take 1 capsule (50 mg total) by mouth at bedtime as  needed (nerve pain).     ranitidine 300 MG tablet  Commonly known as:  ZANTAC  One at bedtime     traMADol 50 MG tablet  Commonly known as:  ULTRAM  Take 1 tablet (50 mg total) by mouth every 12 (twelve) hours as needed for moderate pain or severe pain.        Discharge Condition:  Disposition: 01-Home or Self Care   Consults: Nephrology   Significant Diagnostic Studies: Dg Chest 2 View  12/29/2014   CLINICAL DATA:  Fever, shortness of breath, cough and congestion. History of diabetes, congestive heart failure, COPD and hypertension. Initial encounter.  EXAM: CHEST  2 VIEW  COMPARISON:  12/25/2014 and 04/16/2014.  FINDINGS: Stable cardiomegaly and chronic vascular congestion. There is no overt pulmonary edema or confluent airspace opacity. There is no pleural effusion or pneumothorax. The bones appear unchanged.  IMPRESSION: Stable cardiomegaly and chronic vascular congestion.   Electronically Signed   By: Camie Patience M.D.   On: 12/29/2014 14:43   Ct Head Wo Contrast  12/25/2014   CLINICAL DATA:  Altered mental status and questionable seizure  EXAM: CT HEAD WITHOUT CONTRAST  TECHNIQUE: Contiguous axial images were obtained from the base of the skull through the vertex without intravenous contrast.  COMPARISON:  None.  FINDINGS: There is age related volume loss. There is no intracranial mass, hemorrhage, extra-axial fluid collection, or midline shift. Gray-white compartments appear normal. No acute infarct apparent. There is mild basal ganglia calcification bilaterally. Bony calvarium appears intact. 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  IMPRESSION: No intracranial mass, hemorrhage, or focal gray -white compartment lesions/acute appearing infarct. Mild basal ganglia calcification is felt to be physiologic in this age group. 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   Electronically Signed   By: Lowella Grip M.D.   On: 12/25/2014 14:42   Nm Pulmonary Perf And Vent  12/30/2014   CLINICAL DATA:   Subsequent encounter for shortness of Breath. Diabetes. CHF. COPD. Hypertension.  EXAM: NUCLEAR MEDICINE VENTILATION - PERFUSION LUNG SCAN  TECHNIQUE: Ventilation images were obtained in multiple projections using inhaled aerosol technetium 99 M DTPA. Perfusion images were obtained in multiple projections after intravenous injection of Tc-62mMAA.  RADIOPHARMACEUTICALS:  40.0 mCi Tc-938mTPA aerosol and 6.0 mCi Tc-9965mA  COMPARISON:  Chest radiograph of 1 day prior  FINDINGS: Ventilation: No focal ventilation defect.  Perfusion: No wedge shaped peripheral perfusion defects to suggest acute pulmonary embolism.  IMPRESSION: No evidence of pulmonary embolism.   Electronically Signed   By: Abigail Miyamoto M.D.   On: 12/30/2014 17:31   Dg Chest Portable 1 View  12/25/2014   CLINICAL DATA:  Leg swelling and shortness of breath.  Chest pain  EXAM: PORTABLE CHEST - 1 VIEW  COMPARISON:  04/16/2014  FINDINGS: Chronic cardiomegaly, hilar enlargement, and aortic tortuosity. When accounting for technique, there is no edema, consolidation, effusion, or pneumothorax. No osseous findings to explain chest pain  IMPRESSION: Cardiomegaly without failure.   Electronically Signed   By: Jorje Guild M.D.   On: 12/25/2014 11:32   Mm Screening Breast Tomo Bilateral  12/10/2014   CLINICAL DATA:  Screening.  EXAM: DIGITAL SCREENING BILATERAL MAMMOGRAM WITH 3D TOMO WITH CAD  COMPARISON:  Previous exam(s).  ACR Breast Density Category b: There are scattered areas of fibroglandular density.  FINDINGS: There are no findings suspicious for malignancy. Images were processed with CAD.  IMPRESSION: No mammographic evidence of malignancy. A result letter of this screening mammogram will be mailed directly to the patient.  RECOMMENDATION: Screening mammogram in one year. (Code:SM-B-01Y)  BI-RADS CATEGORY  1: Negative.   Electronically Signed   By: Abelardo Diesel M.D.   On: 12/10/2014 09:43     Microbiology: Recent Results (from the past 240  hour(s))  Urine culture     Status: None   Collection Time: 12/25/14  1:43 PM  Result Value Ref Range Status   Specimen Description URINE, RANDOM  Final   Special Requests NONE  Final   Colony Count   Final    >=100,000 COLONIES/ML Performed at [facility]    Culture   Final    KLEBSIELLA PNEUMONIAE Performed at [facility]    Report Status 12/28/2014 FINAL  Final   Organism ID, Bacteria KLEBSIELLA PNEUMONIAE  Final      Susceptibility   Klebsiella pneumoniae - MIC*    AMPICILLIN >=32 RESISTANT Resistant     CEFAZOLIN <=4 SENSITIVE Sensitive     CEFTRIAXONE <=1 SENSITIVE Sensitive     CIPROFLOXACIN <=0.25 SENSITIVE Sensitive     GENTAMICIN <=1 SENSITIVE Sensitive     LEVOFLOXACIN 1 SENSITIVE Sensitive     NITROFURANTOIN 256 RESISTANT Resistant     TOBRAMYCIN <=1 SENSITIVE Sensitive     TRIMETH/SULFA <=20 SENSITIVE Sensitive     PIP/TAZO 64 INTERMEDIATE Intermediate     * KLEBSIELLA PNEUMONIAE  MRSA PCR Screening     Status: None   Collection Time: 12/25/14  6:10 PM  Result Value Ref Range Status   MRSA by PCR NEGATIVE NEGATIVE Final    Comment:        The GeneXpert MRSA Assay (FDA approved for NASAL specimens only), is one component of a comprehensive MRSA colonization surveillance program. It is not intended to diagnose MRSA infection nor to guide or monitor treatment for MRSA infections.   Culture, blood (routine x 2)     Status: None (Preliminary result)   Collection Time: 12/29/14  1:50 PM  Result Value Ref Range Status   Specimen Description BLOOD RIGHT HAND  Final   Special Requests BOTTLES DRAWN AEROBIC ONLY 1CC  Final   Culture   Final           BLOOD CULTURE RECEIVED NO GROWTH TO DATE CULTURE WILL BE HELD FOR 5 DAYS BEFORE ISSUING A FINAL NEGATIVE REPORT Performed at [facility]  Report Status PENDING  Incomplete  Culture, blood (routine x 2)     Status: None (Preliminary result)   Collection Time: 12/29/14  2:05 PM  Result  Value Ref Range Status   Specimen Description BLOOD RIGHT HAND  Final   Special Requests BOTTLES DRAWN AEROBIC ONLY 1CC  Final   Culture   Final           BLOOD CULTURE RECEIVED NO GROWTH TO DATE CULTURE WILL BE HELD FOR 5 DAYS BEFORE ISSUING A FINAL NEGATIVE REPORT Performed at [facility]    Report Status PENDING  Incomplete     Labs: Results for orders placed or performed during the hospital encounter of 12/25/14 (from the past 48 hour(s))  Culture, blood (routine x 2)     Status: None (Preliminary result)   Collection Time: 12/29/14  1:50 PM  Result Value Ref Range   Specimen Description BLOOD RIGHT HAND    Special Requests BOTTLES DRAWN AEROBIC ONLY 1CC    Culture             BLOOD CULTURE RECEIVED NO GROWTH TO DATE CULTURE WILL BE HELD FOR 5 DAYS BEFORE ISSUING A FINAL NEGATIVE REPORT Performed at [facility]    Report Status PENDING   Culture, blood (routine x 2)     Status: None (Preliminary result)   Collection Time: 12/29/14  2:05 PM  Result Value Ref Range   Specimen Description BLOOD RIGHT HAND    Special Requests BOTTLES DRAWN AEROBIC ONLY 1CC    Culture             BLOOD CULTURE RECEIVED NO GROWTH TO DATE CULTURE WILL BE HELD FOR 5 DAYS BEFORE ISSUING A FINAL NEGATIVE REPORT Performed at [facility]    Report Status PENDING   Glucose, capillary     Status: Abnormal   Collection Time: 12/29/14  3:55 PM  Result Value Ref Range   Glucose-Capillary 232 (H) 70 - 99 mg/dL  Glucose, capillary     Status: Abnormal   Collection Time: 12/29/14  9:03 PM  Result Value Ref Range   Glucose-Capillary 161 (H) 70 - 99 mg/dL  CBC     Status: Abnormal   Collection Time: 12/30/14 11:00 AM  Result Value Ref Range   WBC 4.6 4.0 - 10.5 K/uL   RBC 3.50 (L) 3.87 - 5.11 MIL/uL   Hemoglobin 10.3 (L) 12.0 - 15.0 g/dL   HCT 32.0 (L) 36.0 - 46.0 %   MCV 91.4 78.0 - 100.0 fL   MCH 29.4 26.0 - 34.0 pg   MCHC 32.2 30.0 - 36.0 g/dL   RDW 17.8 (H) 11.5 - 15.5 %    Platelets 80 (L) 150 - 400 K/uL    Comment: CONSISTENT WITH PREVIOUS RESULT  Renal function panel     Status: Abnormal   Collection Time: 12/30/14 11:00 AM  Result Value Ref Range   Sodium 134 (L) 135 - 145 mmol/L   Potassium 3.6 3.5 - 5.1 mmol/L   Chloride 96 96 - 112 mmol/L   CO2 26 19 - 32 mmol/L   Glucose, Bld 206 (H) 70 - 99 mg/dL   BUN 79 (H) 6 - 23 mg/dL   Creatinine, Ser 5.24 (H) 0.50 - 1.10 mg/dL   Calcium 9.1 8.4 - 10.5 mg/dL   Phosphorus 5.5 (H) 2.3 - 4.6 mg/dL   Albumin 2.3 (L) 3.5 - 5.2 g/dL   GFR calc non Af Amer 8 (L) >90 mL/min  GFR calc Af Amer 9 (L) >90 mL/min    Comment: (NOTE) The eGFR has been calculated using the CKD EPI equation. This calculation has not been validated in all clinical situations. eGFR's persistently <90 mL/min signify possible Chronic Kidney Disease.    Anion gap 12 5 - 15  Glucose, capillary     Status: None   Collection Time: 12/30/14  5:11 PM  Result Value Ref Range   Glucose-Capillary 95 70 - 99 mg/dL  Glucose, capillary     Status: Abnormal   Collection Time: 12/30/14 10:24 PM  Result Value Ref Range   Glucose-Capillary 256 (H) 70 - 99 mg/dL  CBC     Status: Abnormal   Collection Time: 12/31/14  6:29 AM  Result Value Ref Range   WBC 4.4 4.0 - 10.5 K/uL   RBC 3.44 (L) 3.87 - 5.11 MIL/uL   Hemoglobin 9.9 (L) 12.0 - 15.0 g/dL   HCT 32.2 (L) 36.0 - 46.0 %   MCV 93.6 78.0 - 100.0 fL   MCH 28.8 26.0 - 34.0 pg   MCHC 30.7 30.0 - 36.0 g/dL   RDW 17.9 (H) 11.5 - 15.5 %   Platelets 103 (L) 150 - 400 K/uL    Comment: CONSISTENT WITH PREVIOUS RESULT  Glucose, capillary     Status: Abnormal   Collection Time: 12/31/14  9:01 AM  Result Value Ref Range   Glucose-Capillary 173 (H) 70 - 99 mg/dL  Glucose, capillary     Status: Abnormal   Collection Time: 12/31/14 11:40 AM  Result Value Ref Range   Glucose-Capillary 175 (H) 70 - 99 mg/dL     HPI [name] is a 69 y.o. 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  In the ER workup suggested of hypoglycemia, altered mental status which reversed after she received D50, CT head nonacute, she was seen by nephrology who decided that based on her potassium, elevated BUN and close to ESRD status they will start dialysis tonight. I was called to admit the patient. Patient is now close to her baseline. Currently no subjective complaints except some chronic cramps and twitching in both arms.   HOSPITAL COURSE:   Hypoglycemia now hyperglycemia in DM 2 - initially required D5 infusion Hemoglobin A1c 7.5 Reduced home dose of Lantus to 25 units daily,  Continue Accu-Cheks 3 times a day   Fever/ acute bronchitis Has KLEBSIELLA PNEUMONIAE UTI -received Rocephin 2 days, continue Cipro by mouth for another 5 days On Z pak for bronchitis , received a total of 5 days now discontinued Repeat blood culture no growth so far   Thrombocytopenia HIT panel ordered and pending Avoid heparin products Venous duplex to rule out DVT/VQ scan to rule out PE was negative. Platelet count improving , 103 prior to discharge   CKD stage V now ESRD -Dialysis started on this admission Has chair 01/01/15 at [facility] at [place] left heel - no ulcer or cellulits noted- very tender around crack- started eucerin BID  UTI? Continue ciprofloxacin  COPD.  -No acute issues. Supportive  care with nebulizer treatments - oxygen as needed.   Dyslipidemia.  -Continue home dose statin.   Essential hypertension.  -Continue home dose  Catapres along with diuretics. She still making urine.  History of gout.  -Continue allopurinol.   Hypothyroidism.  -Continue home dose Synthroid.   GERD.  -Continue on H2 blocker.    Discharge Exam:    Blood pressure 109/73, pulse 68, temperature 97.8 F (36.6 C), temperature source Oral, resp. rate 20, height 5' 5.5" (1.664 m), weight 89.7 kg (197 lb 12 oz), SpO2 100 %. General: No acute respiratory distress Lungs: Clear to auscultation bilaterally without wheezes or crackles Cardiovascular: Regular rate and rhythm without murmur gallop or rub normal S1 and S2 Abdomen: Nontender, nondistended, soft, bowel sounds positive, no rebound, no ascites, no appreciable mass Extremities: No significant cyanosis, clubbing, or edema bilateral lower extremities Skin- dry skin with cracked left heel which is tender- no edema, cellulitis or ulcer          Follow-up Information    Follow up with MCKEOWN,WILLIAM DAVID, MD. Schedule an appointment as soon as possible for a visit in 1 week.   Specialty:  Internal Medicine   Contact information:   [address] [place] [address] [phone]       Signed: Reyne Dumas 12/31/2014, 12:47 PM

## 2015-01-02 LAB — GLUCOSE, CAPILLARY: Glucose-Capillary: 18 mg/dL — CL (ref 70–99)

## 2015-01-04 LAB — CULTURE, BLOOD (ROUTINE X 2)
CULTURE: NO GROWTH
Culture: NO GROWTH

## 2015-01-07 DIAGNOSIS — E1165 Type 2 diabetes mellitus with hyperglycemia: Secondary | ICD-10-CM

## 2015-01-07 DIAGNOSIS — N186 End stage renal disease: Secondary | ICD-10-CM

## 2015-01-07 DIAGNOSIS — I120 Hypertensive chronic kidney disease with stage 5 chronic kidney disease or end stage renal disease: Secondary | ICD-10-CM

## 2015-01-07 DIAGNOSIS — R2689 Other abnormalities of gait and mobility: Secondary | ICD-10-CM

## 2015-01-14 ENCOUNTER — Ambulatory Visit: Payer: Medicare Other

## 2015-01-20 DEATH — deceased

## 2015-02-10 ENCOUNTER — Encounter: Payer: Self-pay | Admitting: Internal Medicine

## 2015-02-19 ENCOUNTER — Telehealth: Payer: Self-pay | Admitting: *Deleted

## 2015-02-19 NOTE — Telephone Encounter (Signed)
LM for patient to return my call.

## 2015-03-02 ENCOUNTER — Ambulatory Visit: Payer: Medicare Other | Admitting: Podiatry

## 2015-03-02 NOTE — Progress Notes (Signed)
Subjective:     Patient ID: [name], female   DOB: 1946-08-21, 69 y.o.   MRN: [id number]  HPI patient presents with nail disease 1-5 both feet that are painful and she cannot cut   Review of Systems     Objective:   Physical Exam Neurovascular status intact with thick yellow brittle nailbeds 1-5 both feet    Assessment:     Mycotic nail infection with pain 1-5 both feet    Plan:     Debridement painful nailbeds 1-5 both feet with no iatrogenic bleeding noted

## 2015-03-16 ENCOUNTER — Encounter: Payer: Self-pay | Admitting: Internal Medicine

## 2015-05-08 NOTE — Telephone Encounter (Signed)
Mailed letter for patient to contact the office to schedule a follow up colonoscopy at [facility]

## 2015-12-11 IMAGING — DX DG CHEST 2V
2 series · 2 of 2 positions shown · non-contrast
Comparison: 12/25/2014 and 04/16/2014.

CLINICAL DATA: Fever, shortness of breath, cough and congestion.
History of diabetes, congestive heart failure, COPD and
hypertension. Initial encounter.

EXAM:
CHEST  2 VIEW

[chest lat]
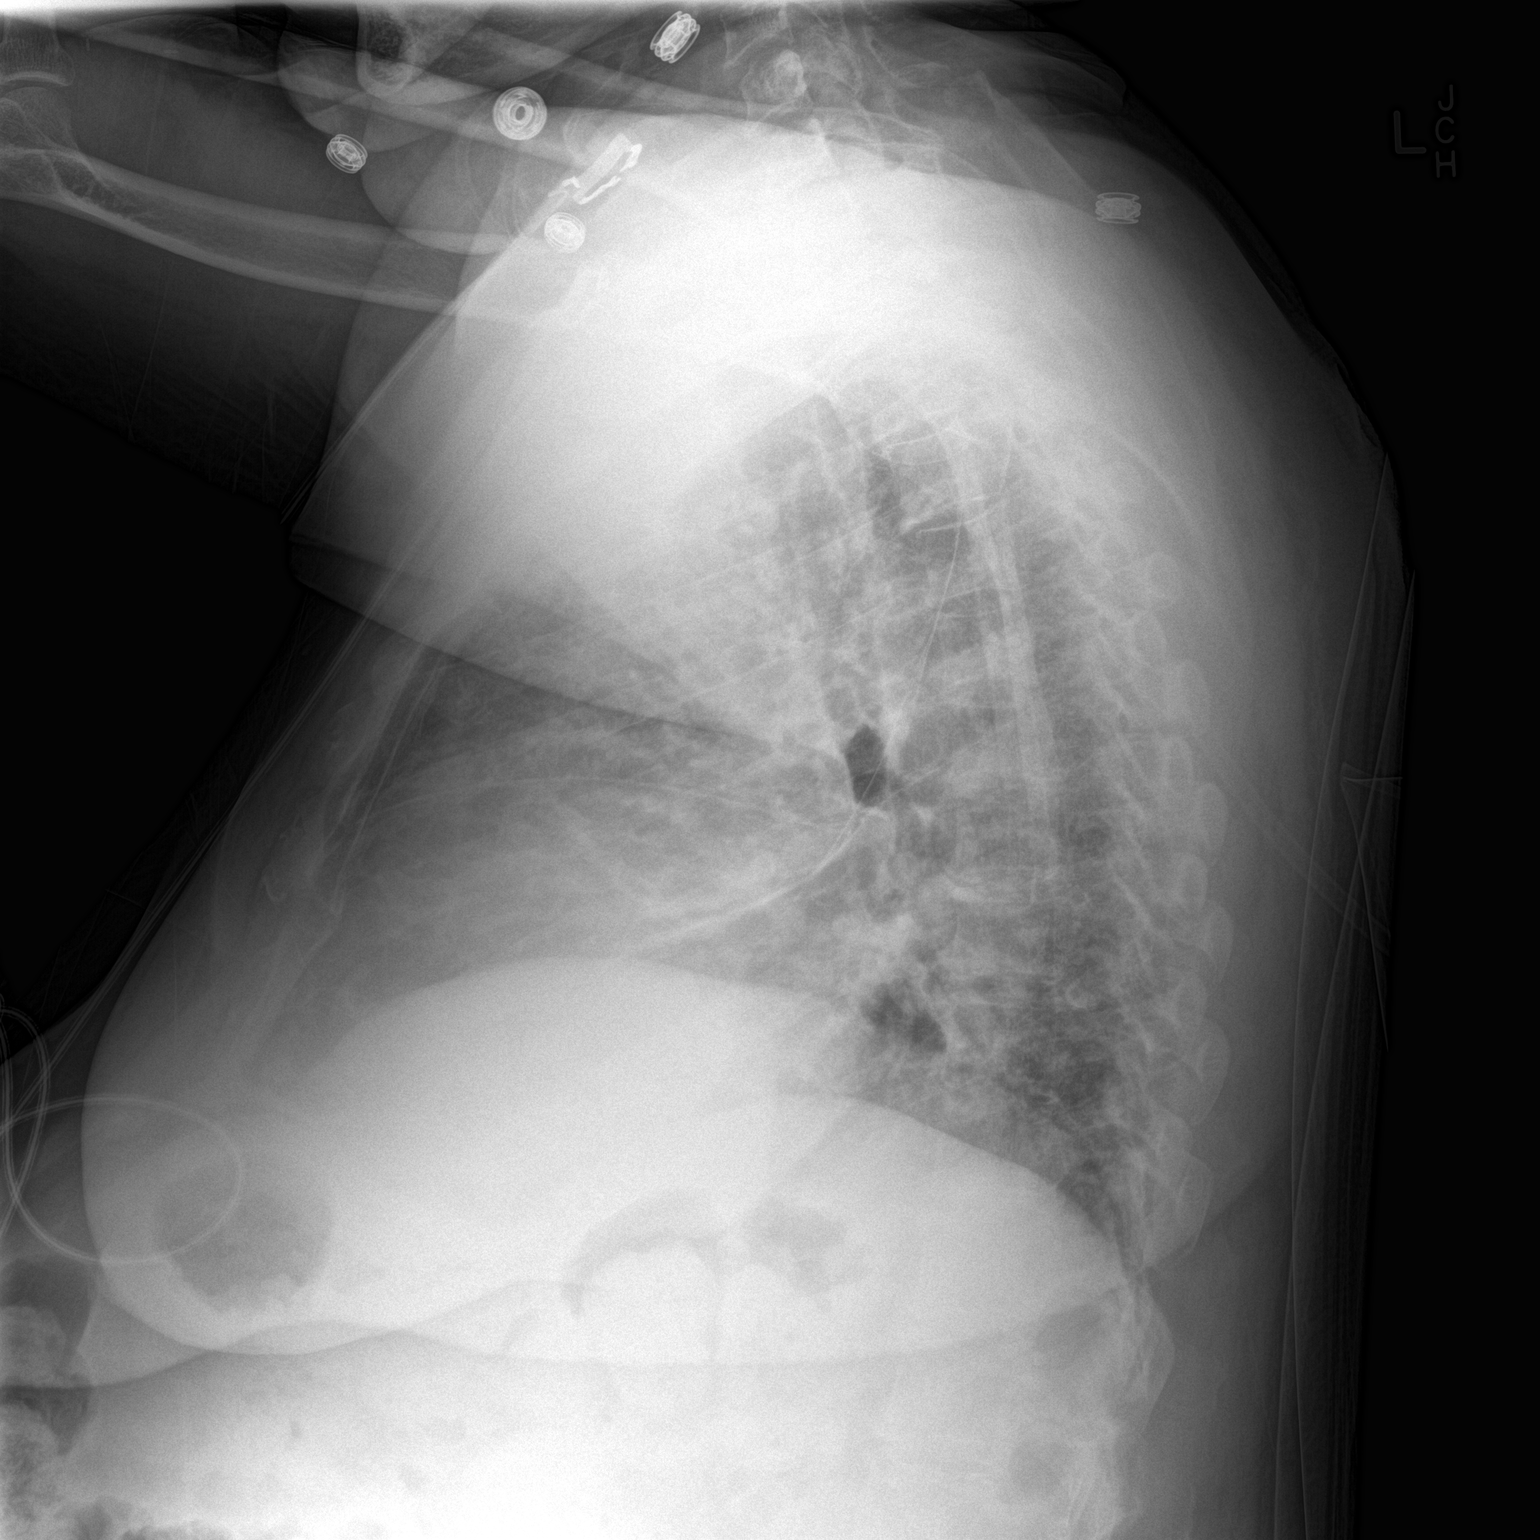

[chest ap]
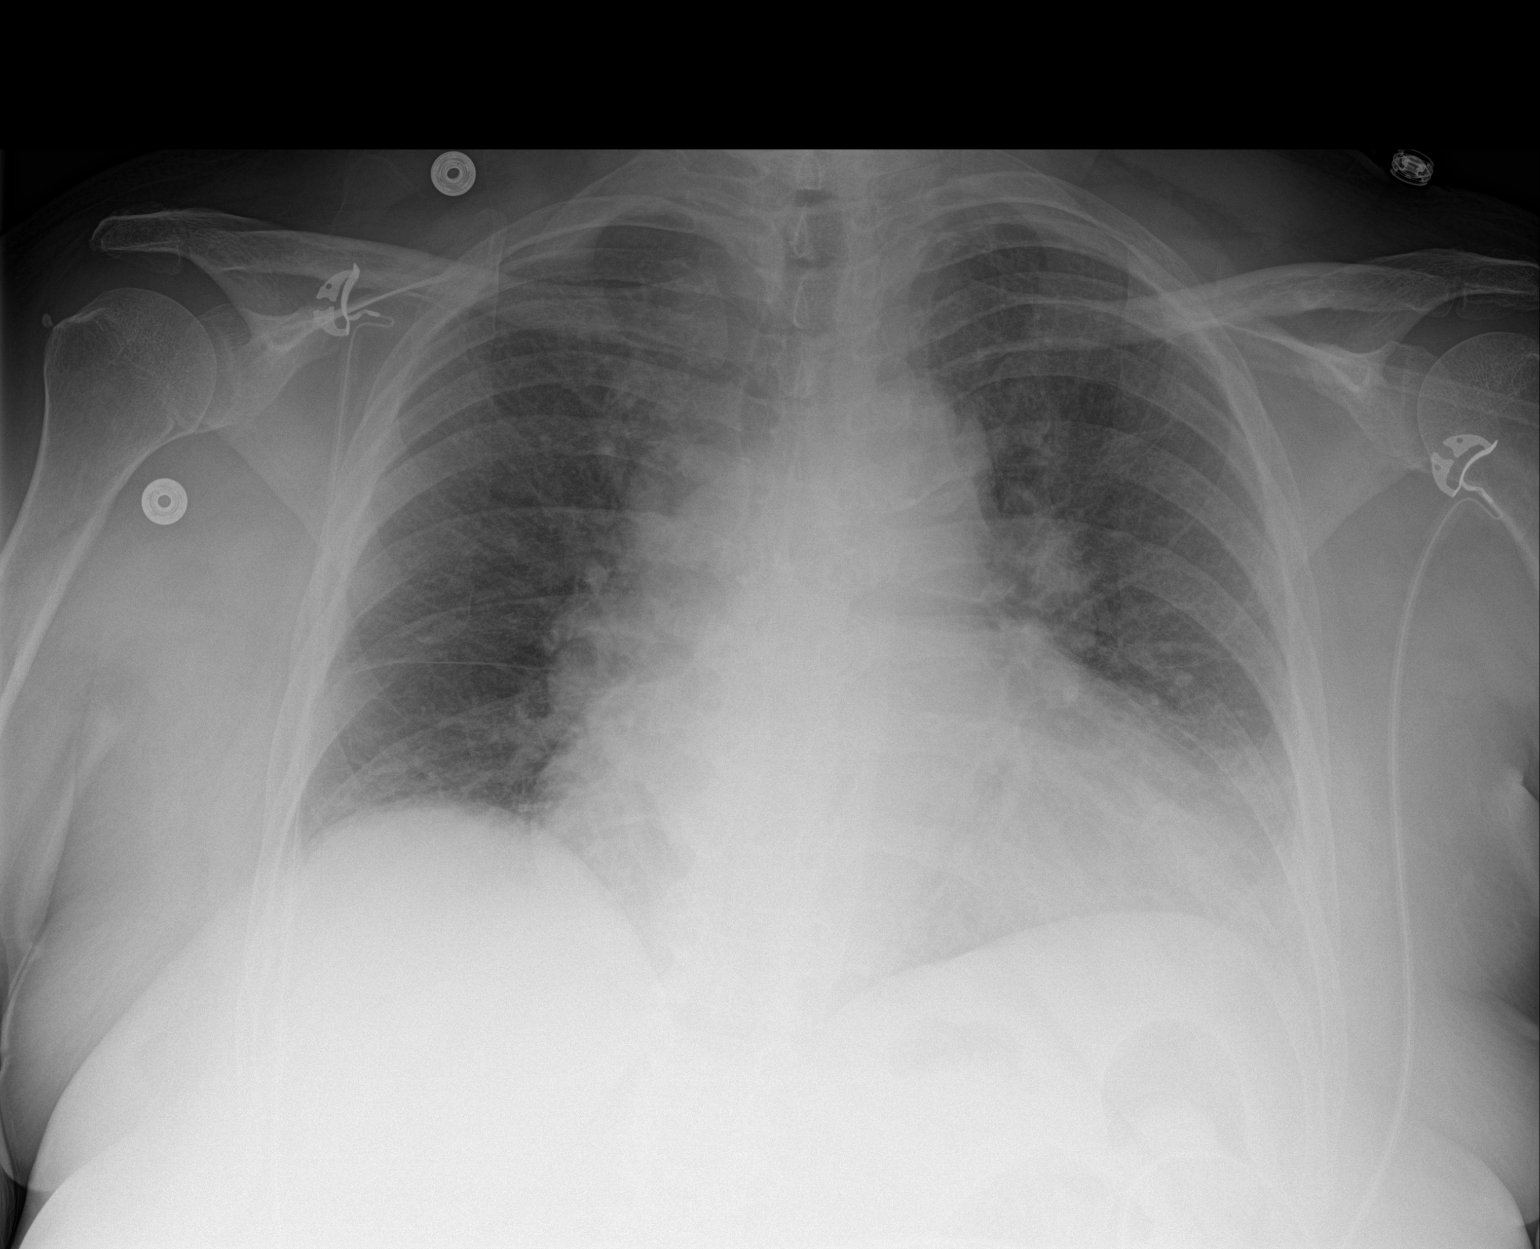

[2 of 2 positions shown; findings below may reference images not displayed]

FINDINGS: Stable cardiomegaly and chronic vascular congestion. There is no
overt pulmonary edema or confluent airspace opacity. There is no
pleural effusion or pneumothorax. The bones appear unchanged.
IMPRESSION: Stable cardiomegaly and chronic vascular congestion.
